# Patient Record
Sex: Female | Born: 1970 | Race: Black or African American | Hispanic: No | Marital: Single | Smoking: Former smoker
Health system: Southern US, Community
[De-identification: ages and names within clinical notes are randomized; demographics above are authoritative.]

## PROBLEM LIST (undated history)

## (undated) DIAGNOSIS — J45909 Unspecified asthma, uncomplicated: Secondary | ICD-10-CM

## (undated) DIAGNOSIS — C50919 Malignant neoplasm of unspecified site of unspecified female breast: Secondary | ICD-10-CM

## (undated) DIAGNOSIS — K219 Gastro-esophageal reflux disease without esophagitis: Secondary | ICD-10-CM

## (undated) HISTORY — PX: BREAST BIOPSY: SHX20

## (undated) HISTORY — DX: Malignant neoplasm of unspecified site of unspecified female breast: C50.919

## (undated) HISTORY — PX: TUBAL LIGATION: SHX77

---

## 2004-06-23 ENCOUNTER — Emergency Department: Payer: Self-pay | Admitting: Emergency Medicine

## 2009-05-14 ENCOUNTER — Emergency Department: Payer: Self-pay | Admitting: Emergency Medicine

## 2009-12-01 ENCOUNTER — Emergency Department: Payer: Self-pay | Admitting: Emergency Medicine

## 2010-04-27 ENCOUNTER — Emergency Department: Payer: Self-pay | Admitting: Emergency Medicine

## 2010-11-26 ENCOUNTER — Emergency Department: Payer: Self-pay | Admitting: Emergency Medicine

## 2011-02-09 ENCOUNTER — Emergency Department: Payer: Self-pay | Admitting: Emergency Medicine

## 2011-03-09 ENCOUNTER — Emergency Department: Payer: Self-pay | Admitting: Emergency Medicine

## 2011-12-07 ENCOUNTER — Emergency Department: Payer: Self-pay | Admitting: Emergency Medicine

## 2012-04-26 ENCOUNTER — Emergency Department: Payer: Self-pay | Admitting: Emergency Medicine

## 2012-09-06 ENCOUNTER — Encounter (HOSPITAL_COMMUNITY): Payer: Self-pay | Admitting: *Deleted

## 2012-09-06 ENCOUNTER — Emergency Department (HOSPITAL_COMMUNITY): Payer: BC Managed Care – PPO

## 2012-09-06 ENCOUNTER — Emergency Department (HOSPITAL_COMMUNITY)
Admission: EM | Admit: 2012-09-06 | Discharge: 2012-09-06 | Disposition: A | Discharge status: Home / Self Care | Payer: BC Managed Care – PPO | Attending: Emergency Medicine | Admitting: Emergency Medicine

## 2012-09-06 DIAGNOSIS — Y9289 Other specified places as the place of occurrence of the external cause: Secondary | ICD-10-CM | POA: Insufficient documentation

## 2012-09-06 DIAGNOSIS — Y9389 Activity, other specified: Secondary | ICD-10-CM | POA: Insufficient documentation

## 2012-09-06 DIAGNOSIS — F17200 Nicotine dependence, unspecified, uncomplicated: Secondary | ICD-10-CM | POA: Insufficient documentation

## 2012-09-06 DIAGNOSIS — S46909A Unspecified injury of unspecified muscle, fascia and tendon at shoulder and upper arm level, unspecified arm, initial encounter: Secondary | ICD-10-CM | POA: Insufficient documentation

## 2012-09-06 DIAGNOSIS — X503XXA Overexertion from repetitive movements, initial encounter: Secondary | ICD-10-CM | POA: Insufficient documentation

## 2012-09-06 DIAGNOSIS — T148XXA Other injury of unspecified body region, initial encounter: Secondary | ICD-10-CM

## 2012-09-06 DIAGNOSIS — IMO0002 Reserved for concepts with insufficient information to code with codable children: Secondary | ICD-10-CM | POA: Insufficient documentation

## 2012-09-06 DIAGNOSIS — X500XXA Overexertion from strenuous movement or load, initial encounter: Secondary | ICD-10-CM | POA: Insufficient documentation

## 2012-09-06 DIAGNOSIS — Z791 Long term (current) use of non-steroidal anti-inflammatories (NSAID): Secondary | ICD-10-CM | POA: Insufficient documentation

## 2012-09-06 DIAGNOSIS — S4980XA Other specified injuries of shoulder and upper arm, unspecified arm, initial encounter: Secondary | ICD-10-CM | POA: Insufficient documentation

## 2012-09-06 DIAGNOSIS — M25512 Pain in left shoulder: Secondary | ICD-10-CM

## 2012-09-06 MED ORDER — HYDROCODONE-ACETAMINOPHEN 5-325 MG PO TABS
1.0000 | ORAL_TABLET | Freq: Once | ORAL | Status: AC
  Administered 2012-09-06: 1 via ORAL
  Filled 2012-09-06: qty 1

## 2012-09-06 MED ORDER — DIAZEPAM 5 MG/ML IJ SOLN
10.0000 mg | Freq: Once | INTRAMUSCULAR | Status: AC
  Administered 2012-09-06: 10 mg via INTRAMUSCULAR
  Filled 2012-09-06: qty 2

## 2012-09-06 MED ORDER — NAPROXEN 500 MG PO TABS: 500.0000 mg | ORAL_TABLET | Freq: Two times a day (BID) | ORAL | Status: DC

## 2012-09-06 MED ORDER — CYCLOBENZAPRINE HCL 10 MG PO TABS: 10.0000 mg | ORAL_TABLET | Freq: Two times a day (BID) | ORAL | Status: DC | PRN

## 2012-09-06 NOTE — ED Notes (Signed)
Pt reports she was moving and lifting things at work last night, afterwards pt started having left shoulder/arm pain. 10/10. This morning the pain has increased. Pain increases with movement. Denies SOB or chest pain.

## 2012-09-06 NOTE — ED Provider Notes (Signed)
CSN: [id number]     Arrival date & time 09/06/12  1104 History     First MD Initiated Contact with Patient 09/06/12 1134     Chief Complaint  Patient presents with  . left shoulder/arm pain    (Consider location/radiation/quality/duration/timing/severity/associated sxs/prior Treatment) HPI Jill Walsh is a 42 y/o F presenting to the ED with left shoulder pain that started last night. 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 Patient reported that last night while at work her left shoulder became sore, she took a Tylenol and continued to work. Patient stated that when she got home she noticed that her pain in her left shoulder had increased, stated that at 2:00Am this morning she woke up in pain. Pain described as a constant sharp, shooting pain that radiates down the left arm - worse with motion, better at rest. Stated that she took a Ibuprofen today. Denied prior injury, weakness, loss of sensation, tingling, numbness, fall, fever, chills, neck pain, difficulty swallowing, voice changes.  PCP none  History reviewed. No pertinent past medical history. History reviewed. No pertinent past surgical history. History reviewed. No pertinent family history. History  Substance Use Topics  . Smoking status: Current Every Day Smoker -- 0.25 packs/day for 20 years    Types: Cigarettes  . Smokeless tobacco: Not on file  . Alcohol Use: No   OB History   Grav Para Term Preterm Abortions TAB SAB Ect Mult Living                 Review of Systems  Constitutional: Negative for fever and chills.  HENT: Negative for sore throat, trouble swallowing, neck pain, neck stiffness and voice change.   Eyes: Negative for visual disturbance.  Respiratory: Negative for cough, chest tightness and shortness of breath.   Musculoskeletal: Positive for arthralgias (left shoulder). Negative for back pain and joint swelling.  Neurological:  Negative for weakness and numbness.  All other systems reviewed and are negative.    Allergies  Review of patient's allergies indicates no known allergies.  Home Medications   Current Outpatient Rx  Name  Route  Sig  Dispense  Refill  . ibuprofen (ADVIL,MOTRIN) 200 MG tablet   Oral   Take 400 mg by mouth every 6 (six) hours as needed for pain.         . cyclobenzaprine (FLEXERIL) 10 MG tablet   Oral   Take 1 tablet (10 mg total) by mouth 2 (two) times daily as needed for muscle spasms.   20 tablet   0   . naproxen (NAPROSYN) 500 MG tablet   Oral   Take 1 tablet (500 mg total) by mouth 2 (two) times daily.   30 tablet   0    BP 155/79  Pulse 72  Temp(Src) 98.3 F (36.8 C) (Oral)  Resp 20  SpO2 98%  LMP 08/22/2012 Physical Exam  Nursing note and vitals reviewed. Constitutional: She is oriented to person, place, and time. She appears well-developed and well-nourished. No distress.  HENT:  Head: Normocephalic and atraumatic.  Neck: Normal range of motion. Neck supple. No tracheal deviation present.  Negative neck stiffness Negative nuchal rigidity Negative mid-cervical spine tenderness noted  Cardiovascular: Normal rate, regular rhythm and normal heart sounds.  Exam reveals no friction rub.   No murmur heard. Pulses:      Radial pulses are 2+ on the right side, and 2+ on the left side.  Pulmonary/Chest: Effort  normal and breath sounds normal. No respiratory distress. She has no wheezes. She has no rales.  Musculoskeletal: Normal range of motion. She exhibits tenderness (muscular in nature - discomfort upon palpation to the trapezius muscle, left ).       Arms: Negative deformities, inflammation,swelling, erythema, warmth to touch, lesions, sores. Muscular tenderness noted upon palpation to the left shoulder. Negative drop arm test. Full ROM to the left shoulder noted - flexion, extension, abduction, adduction, inversion, and eversion. Negative sunken in appearance.     Lymphadenopathy:    She has no cervical adenopathy.  Neurological: She is alert and oriented to person, place, and time. No cranial nerve deficit. She exhibits normal muscle tone. Coordination normal.  Cranial nerves III-XII grossly intact Sensation intact to BUE with differentiation to sharp and dull touch Strength 5+/5+ to BUE with resistance - discomfort noted  Skin: She is not diaphoretic.    ED Course   Procedures (including critical care time)  Medications  diazepam (VALIUM) injection 10 mg (10 mg Intramuscular Given 09/06/12 1243)  HYDROcodone-acetaminophen (NORCO/VICODIN) 5-325 MG per tablet 1 tablet (1 tablet Oral Given 09/06/12 1243)    Labs Reviewed - No data to display Dg Shoulder Left  09/06/2012   *RADIOLOGY REPORT*  Clinical Data: Left shoulder pain/injury  LEFT SHOULDER - 2+ VIEW  Comparison: None.  Findings: No fracture or dislocation is seen.  Mild degenerative changes of the acromioclavicular joint.  The visualized soft tissues are unremarkable.  Visualized left lung is clear.  IMPRESSION: No fracture or dislocation is seen.   Original Report Authenticated By: Charline Bills, M.D.   1. Shoulder pain, left   2. Muscle strain     MDM  Patient presenting to the ED with left shoulder pain that has been ongoing since yesterday - described as a soreness that is constant with radiation to the left arm. Negative erythema, inflammation, deformity noted to the left shoulder. Full ROM to the left shoulder. Negative neurological deficits noted. Pulses palpable. Strength intact. Sensation intact. Muscular discomfort noted upon palpation. Suspicion to be musculoskeletal in nature - tension due to heavy lifting on a daily basis. Imaging negative for acute fractures and dislocations. Patient stable, afebrile. Discharged patient with anti-inflammatories and muscle relaxer. Referral to [facility] to be re-evaluated, referral to orthopedics if pain is to worsen. Discussed with  patient to use icy hot and massage - warm compressions. Discussed with patient to refrain from strenuous activity. Discussed with patient to continue to monitor symptoms and if symptoms are to worsen or change to report back to the ED - strict return instructions given. Work note given.  Patient agreed to plan of care, understood, all questions answered.    Raymon Mutton, PA-C 09/06/12 2134

## 2012-09-07 NOTE — ED Provider Notes (Signed)
  Medical screening examination/treatment/procedure(s) were performed by non-physician practitioner and as supervising physician I was immediately available for consultation/collaboration.   Gerhard Munch, MD 09/07/12 [phone]

## 2013-01-15 ENCOUNTER — Emergency Department: Payer: Self-pay | Admitting: Emergency Medicine

## 2014-02-08 DIAGNOSIS — C50919 Malignant neoplasm of unspecified site of unspecified female breast: Secondary | ICD-10-CM

## 2014-02-08 HISTORY — DX: Malignant neoplasm of unspecified site of unspecified female breast: C50.919

## 2014-02-08 HISTORY — PX: MASTECTOMY: SHX3

## 2014-02-28 ENCOUNTER — Ambulatory Visit: Admit: 2014-02-28 | Disposition: A | Discharge status: Home / Self Care | Payer: Self-pay | Admitting: Family Medicine

## 2014-02-28 ENCOUNTER — Ambulatory Visit: Payer: Self-pay | Admitting: Surgery

## 2014-03-21 ENCOUNTER — Ambulatory Visit: Payer: Self-pay | Admitting: Surgery

## 2014-03-28 ENCOUNTER — Ambulatory Visit: Payer: Self-pay | Admitting: Surgery

## 2014-03-28 DIAGNOSIS — C50911 Malignant neoplasm of unspecified site of right female breast: Secondary | ICD-10-CM | POA: Insufficient documentation

## 2014-04-30 ENCOUNTER — Ambulatory Visit
Admit: 2014-04-30 | Disposition: A | Discharge status: Home / Self Care | Payer: Self-pay | Attending: Hematology and Oncology | Admitting: Hematology and Oncology

## 2014-04-30 ENCOUNTER — Encounter: Payer: Self-pay | Admitting: Hematology and Oncology

## 2014-05-03 LAB — CANCER ANTIGEN 27.29: CA 27.29: 24.4 U/mL (ref 0.0–38.6)

## 2014-05-10 ENCOUNTER — Ambulatory Visit
Admit: 2014-05-10 | Disposition: A | Discharge status: Home / Self Care | Payer: Self-pay | Attending: Hematology and Oncology | Admitting: Hematology and Oncology

## 2014-05-28 ENCOUNTER — Encounter: Payer: Self-pay | Admitting: Family Medicine

## 2014-05-28 DIAGNOSIS — Z803 Family history of malignant neoplasm of breast: Secondary | ICD-10-CM | POA: Insufficient documentation

## 2014-05-28 DIAGNOSIS — N63 Unspecified lump in unspecified breast: Secondary | ICD-10-CM | POA: Insufficient documentation

## 2014-05-28 DIAGNOSIS — F17200 Nicotine dependence, unspecified, uncomplicated: Secondary | ICD-10-CM | POA: Insufficient documentation

## 2014-05-28 DIAGNOSIS — C50911 Malignant neoplasm of unspecified site of right female breast: Secondary | ICD-10-CM

## 2014-05-28 DIAGNOSIS — C50919 Malignant neoplasm of unspecified site of unspecified female breast: Secondary | ICD-10-CM | POA: Insufficient documentation

## 2014-06-03 LAB — SURGICAL PATHOLOGY

## 2014-06-09 NOTE — Op Note (Signed)
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  POSTOPERATIVE DIAGNOSIS: Right breast ductal carcinoma in situ.  OPERATION: Right mastectomy.   ANESTHESIA: General.   SURGEON: Rodena Goldmann III, MD   DESCRIPTION OF OPERATIVE PROCEDURE: With the patient in the supine position after the induction of appropriate general anesthesia, the patient's right breast was prepped with ChloraPrep and draped with sterile towels. A fishmouth  incision was measured and drawn on the right breast. The incision was carried down through the subcutaneous tissue with Bovie electrocautery. Superior flap was created down to the 2nd intercostal space, inferior flap down to the inframammary fold. The breast then swept off the chest wall medial to lateral, using Bovie electrocautery for hemostasis. Dissection was stopped just medial to the latissimus muscle. The area was then copiously irrigated. The breast was passed off the table after marking the medial aspect. On the inferior flap, 2 stab wounds were made and 10 mm flat Jackson-Pratt drains were inserted under the flap into the chest wall. The drains secured with 3-0 nylon. The skin was closed with vertical mattress sutures of 4-0 nylon. A compressive dressing was applied. The patient returned to the recovery room, having tolerated the procedure well. Sponge, instrument, needle counts were correct x 2 in the operating room.   ____________________________ Rodena Goldmann III, MD rle:mw D: 03/28/2014 13:51:07 ET T: 03/28/2014 14:41:42 ET JOB#: [id number]  cc: Rodena Goldmann III, MD, <Dictator> Rodena Goldmann MD ELECTRONICALLY SIGNED 03/29/2014 18:09

## 2014-06-09 NOTE — Discharge Summary (Signed)
PATIENT NAME:  Jill Walsh, Jill Walsh MR#:  [id number] DATE OF BIRTH:  April 01, 1970  DATE OF ADMISSION:  03/28/2014 DATE OF DISCHARGE:  03/29/2014    BRIEF HISTORY: Genesys Coggeshall is a 44 year old woman with recently diagnosed breast carcinoma in the form of ductal carcinoma in situ. She had a negative axillary lymph node biopsy. After appropriate treatment discussions were outlined to her, she elected to proceed with mastectomy. After appropriate preoperative preparation and informed consent, she was taken to surgery on the morning of 03/28/2014, where she underwent a simple mastectomy. The procedure was uncomplicated. She had no significant intraoperative problems. Today, she is up, active, tolerating a diet, with no complaints, with good pain control, minimal wound discomfort. She has not had any significant drainage. Discharged home today to be followed in the office in 3-5 days for pathology review and possible drain removal. This plan has been discussed with the patient in detail, and she is in agreement.    ____________________________ Micheline Maze, MD rle:mw D: 03/29/2014 12:28:26 ET T: 03/29/2014 13:56:25 ET JOB#: [id number]  cc: Rodena Goldmann III, MD, <Dictator> Rodena Goldmann MD ELECTRONICALLY SIGNED 03/29/2014 18:10

## 2014-08-19 ENCOUNTER — Other Ambulatory Visit: Payer: Self-pay

## 2014-08-19 DIAGNOSIS — C50911 Malignant neoplasm of unspecified site of right female breast: Secondary | ICD-10-CM

## 2014-08-23 ENCOUNTER — Inpatient Hospital Stay (HOSPITAL_BASED_OUTPATIENT_CLINIC_OR_DEPARTMENT_OTHER): Payer: 59 | Admitting: Hematology and Oncology

## 2014-08-23 ENCOUNTER — Inpatient Hospital Stay: Payer: 59 | Attending: Hematology and Oncology

## 2014-08-23 ENCOUNTER — Encounter: Payer: Self-pay | Admitting: Hematology and Oncology

## 2014-08-23 ENCOUNTER — Other Ambulatory Visit: Payer: Self-pay | Admitting: *Deleted

## 2014-08-23 VITALS — BP 132/92 | HR 106 | Temp 96.6°F | Ht 65.0 in | Wt 192.7 lb

## 2014-08-23 DIAGNOSIS — C50911 Malignant neoplasm of unspecified site of right female breast: Secondary | ICD-10-CM | POA: Diagnosis not present

## 2014-08-23 DIAGNOSIS — Z79899 Other long term (current) drug therapy: Secondary | ICD-10-CM | POA: Insufficient documentation

## 2014-08-23 DIAGNOSIS — Z171 Estrogen receptor negative status [ER-]: Secondary | ICD-10-CM | POA: Insufficient documentation

## 2014-08-23 DIAGNOSIS — F17210 Nicotine dependence, cigarettes, uncomplicated: Secondary | ICD-10-CM

## 2014-08-23 DIAGNOSIS — Z803 Family history of malignant neoplasm of breast: Secondary | ICD-10-CM

## 2014-08-23 DIAGNOSIS — Z9011 Acquired absence of right breast and nipple: Secondary | ICD-10-CM | POA: Diagnosis not present

## 2014-08-23 LAB — COMPREHENSIVE METABOLIC PANEL
ALT: 14 U/L (ref 14–54)
AST: 19 U/L (ref 15–41)
Albumin: 4.1 g/dL (ref 3.5–5.0)
Alkaline Phosphatase: 66 U/L (ref 38–126)
Anion gap: 9 (ref 5–15)
BUN: 12 mg/dL (ref 6–20)
CO2: 25 mmol/L (ref 22–32)
Calcium: 8.9 mg/dL (ref 8.9–10.3)
Chloride: 104 mmol/L (ref 101–111)
Creatinine, Ser: 0.99 mg/dL (ref 0.44–1.00)
GFR calc Af Amer: >60 mL/min (ref >60)
GFR calc non Af Amer: >60 mL/min (ref >60)
Glucose, Bld: 105 mg/dL — ABNORMAL HIGH (ref 65–99)
Potassium: 3.8 mmol/L (ref 3.5–5.1)
Sodium: 138 mmol/L (ref 135–145)
Total Bilirubin: 0.5 mg/dL (ref 0.3–1.2)
Total Protein: 7.8 g/dL (ref 6.5–8.1)

## 2014-08-23 LAB — CBC WITH DIFFERENTIAL/PLATELET
Basophils Absolute: 0.1 10*3/uL (ref 0–0.1)
Basophils Relative: 1 %
Eosinophils Absolute: 0.2 10*3/uL (ref 0–0.7)
Eosinophils Relative: 3 %
HCT: 39.7 % (ref 35.0–47.0)
Hemoglobin: 13 g/dL (ref 12.0–16.0)
Lymphocytes Relative: 25 %
Lymphs Abs: 2.3 10*3/uL (ref 1.0–3.6)
MCH: 28 pg (ref 26.0–34.0)
MCHC: 32.8 g/dL (ref 32.0–36.0)
MCV: 85.4 fL (ref 80.0–100.0)
Monocytes Absolute: 0.6 10*3/uL (ref 0.2–0.9)
Monocytes Relative: 7 %
Neutro Abs: 5.9 10*3/uL (ref 1.4–6.5)
Neutrophils Relative %: 64 %
Platelets: 315 10*3/uL (ref 150–440)
RBC: 4.65 MIL/uL (ref 3.80–5.20)
RDW: 14.3 % (ref 11.5–14.5)
WBC: 9 10*3/uL (ref 3.6–11.0)

## 2014-08-23 NOTE — Progress Notes (Signed)
Pt here for follow up regarding breast cancer; offer no complaints today

## 2014-08-24 LAB — CANCER ANTIGEN 27.29: CA 27.29: 17.9 U/mL (ref 0.0–38.6)

## 2014-11-24 ENCOUNTER — Encounter: Payer: Self-pay | Admitting: Hematology and Oncology

## 2014-11-24 NOTE — Progress Notes (Signed)
[facility] day:  08/23/2014  Chief Complaint: Jill Walsh is a 44 y.o. 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  HPI: The patient was last seen in the medical oncology clinic on 05/21/2014.  At that time, she was recovering well from her surgery.  Exam revealed an unremarkable well healed right mastectomy incision.  We discussed tumor board recommendations for surveillance.  Labs from 04/30/2014 had revealed a normal CBC, CMP, and CA27.29 (24.4).  MyRisk genetic testing revealed no clinically significant mutation.  During the interim, she states that she feels good.  She is working.  She denies any concerns.  She notes that they did the molding for breast prosthesis.  Past Medical History  Diagnosis Date  . Breast cancer     Past Surgical History  Procedure Laterality Date  . Tubal ligation      Family History  Problem Relation Age of Onset  . Cancer Mother   . Stroke Brother   . Cancer Maternal Aunt     Social History:  reports that she has been smoking E-cigarettes.  She has a 5 pack-year smoking history. She does not have any smokeless tobacco history on file. She reports that she does not drink alcohol. Her drug history is not on file.  She is a Freight forwarder at [employer].  The patient is accompanied by her brother, Simona Huh, today.  Allergies: No Known Allergies  Current Medications: Current Outpatient Prescriptions  Medication Sig Dispense Refill  . ibuprofen (ADVIL,MOTRIN) 200 MG tablet Take 400 mg by mouth every 6 (six) hours as needed for pain.    . naproxen (NAPROSYN) 500 MG tablet Take 1 tablet (500 mg total) by mouth 2 (two) times daily. 30 tablet 0  . cyclobenzaprine (FLEXERIL) 10 MG tablet Take 1 tablet (10 mg total) by mouth 2 (two) times daily as needed for muscle spasms. (Patient not taking: Reported on 08/23/2014) 20 tablet 0   No  current facility-administered medications for this visit.    Review of Systems:  GENERAL:  Feels good.  Active.  No fevers, sweats or weight loss. PERFORMANCE STATUS (ECOG):  0 HEENT:  No visual changes, runny nose, sore throat, mouth sores or tenderness. Lungs: No shortness of breath or cough.  No hemoptysis. Cardiac:  No chest pain, palpitations, orthopnea, or PND. GI:  No nausea, vomiting, diarrhea, constipation, melena or hematochezia. GU:  No urgency, frequency, dysuria, or hematuria. Musculoskeletal:  No back pain.  No joint pain.  No muscle tenderness. Extremities:  No pain or swelling. Skin:  No rashes or skin changes. Neuro:  No headache, numbness or weakness, balance or coordination issues. Endocrine:  No diabetes, thyroid issues, hot flashes or night sweats. Psych:  No mood changes, depression or anxiety. Pain:  No focal pain. Review of systems:  All other systems reviewed and found to be negative.  Physical Exam: Blood pressure 132/92, pulse 106, temperature 96.6 F (35.9 C), temperature source Tympanic, height 5' 5" (1.651 m), weight 192 lb 10.9 oz (87.4 kg). GENERAL:  Well developed, well nourished, sitting comfortably in the exam room in no acute distress. MENTAL STATUS:  Alert and oriented to person, place and time. HEAD:  Curly long blonde hair.  Normocephalic, atraumatic, face symmetric, no Cushingoid features. EYES:  Glasses.  Brown eyes.  Pupils equal round and reactive to light and accomodation.  No conjunctivitis or scleral icterus. ENT:  Oropharynx  clear without lesion.  Tongue normal. Mucous membranes moist.  RESPIRATORY:  Clear to auscultation without rales, wheezes or rhonchi. CARDIOVASCULAR:  Regular rate and rhythm without murmur, rub or gallop. BREAST:  Right mastectomy.  Left breast with fibrocystic changes superiorly.  No discrete masses, skin changes or nipple discharge.  ABDOMEN:  Soft, non-tender, with active bowel sounds, and no hepatosplenomegaly.   No masses. SKIN:  No rashes, ulcers or lesions. EXTREMITIES: No edema, no skin discoloration or tenderness.  No palpable cords. LYMPH NODES: No palpable cervical, supraclavicular, axillary or inguinal adenopathy  NEUROLOGICAL: Unremarkable. PSYCH:  Appropriate.  Orders Only on 08/23/2014  Component Date Value Ref Range Status  . WBC 08/23/2014 9.0  3.6 - 11.0 K/uL Final  . RBC 08/23/2014 4.65  3.80 - 5.20 MIL/uL Final  . Hemoglobin 08/23/2014 13.0  12.0 - 16.0 g/dL Final  . HCT 08/23/2014 39.7  35.0 - 47.0 % Final  . MCV 08/23/2014 85.4  80.0 - 100.0 fL Final  . MCH 08/23/2014 28.0  26.0 - 34.0 pg Final  . MCHC 08/23/2014 32.8  32.0 - 36.0 g/dL Final  . RDW 08/23/2014 14.3  11.5 - 14.5 % Final  . Platelets 08/23/2014 315  150 - 440 K/uL Final  . Neutrophils Relative % 08/23/2014 64   Final  . Neutro Abs 08/23/2014 5.9  1.4 - 6.5 K/uL Final  . Lymphocytes Relative 08/23/2014 25   Final  . Lymphs Abs 08/23/2014 2.3  1.0 - 3.6 K/uL Final  . Monocytes Relative 08/23/2014 7   Final  . Monocytes Absolute 08/23/2014 0.6  0.2 - 0.9 K/uL Final  . Eosinophils Relative 08/23/2014 3   Final  . Eosinophils Absolute 08/23/2014 0.2  0 - 0.7 K/uL Final  . Basophils Relative 08/23/2014 1   Final  . Basophils Absolute 08/23/2014 0.1  0 - 0.1 K/uL Final  . Sodium 08/23/2014 138  135 - 145 mmol/L Final  . Potassium 08/23/2014 3.8  3.5 - 5.1 mmol/L Final  . Chloride 08/23/2014 104  101 - 111 mmol/L Final  . CO2 08/23/2014 25  22 - 32 mmol/L Final  . Glucose, Bld 08/23/2014 105* 65 - 99 mg/dL Final  . BUN 08/23/2014 12  6 - 20 mg/dL Final  . Creatinine, Ser 08/23/2014 0.99  0.44 - 1.00 mg/dL Final  . Calcium 08/23/2014 8.9  8.9 - 10.3 mg/dL Final  . Total Protein 08/23/2014 7.8  6.5 - 8.1 g/dL Final  . Albumin 08/23/2014 4.1  3.5 - 5.0 g/dL Final  . AST 08/23/2014 19  15 - 41 U/L Final  . ALT 08/23/2014 14  14 - 54 U/L Final  . Alkaline Phosphatase 08/23/2014 66  38 - 126 U/L Final  . Total  Bilirubin 08/23/2014 0.5  0.3 - 1.2 mg/dL Final  . GFR calc non Af Amer 08/23/2014 >60  >60 mL/min Final  . GFR calc Af Amer 08/23/2014 >60  >60 mL/min Final   Comment: (NOTE) The eGFR has been calculated using the CKD EPI equation. This calculation has not been validated in all clinical situations. eGFR's persistently <60 mL/min signify possible Chronic Kidney Disease.   . Anion gap 08/23/2014 9  5 - 15 Final  . CA 27.29 08/23/2014 17.9  0.0 - 38.6 U/mL Final   Comment: (NOTE) [employer]/ACS methodology Performed At: [facility] [address] [place], [place] [id number] Lindon Romp MD NL:[id number]     Assessment:  Raigen Jagielski is a 44 y.o. female with Her2neu+ multi-focal  microinvasive right breast cancer status post mastectomy on 03/28/2014.  No lymph nodes were removed.  Tumor was ER/PR negative and Her2/neu 3+.  Pathologic stage was T49mNx.  CA27.20 was 24.4 (normal) on 04/30/2014.  She has a mother who developed breast cancer at age 45  A maternal aunt has colon cancer.  MyRisk genetic testing revealed no clinically significant mutation.  Symptomatically, she is doing well.  Exam is unremarkable.  Plan: 1. Labs today:  CBC with diff, CMP, CA27.29. 2. Complete paperwork for breast prosthesis [phone] 3. Anticipate left mammogram on 02/28/2015. 4. RTC in 3 months for MD assessment and labs (CBC with diff, CMP, CA27.29)   MLequita Asal MD  08/23/2014

## 2014-11-25 ENCOUNTER — Inpatient Hospital Stay: Payer: 59 | Attending: Hematology and Oncology

## 2014-11-25 ENCOUNTER — Inpatient Hospital Stay (HOSPITAL_BASED_OUTPATIENT_CLINIC_OR_DEPARTMENT_OTHER): Payer: 59 | Admitting: Hematology and Oncology

## 2014-11-25 VITALS — BP 149/86 | HR 112 | Temp 96.7°F | Resp 18 | Ht 65.0 in | Wt 196.9 lb

## 2014-11-25 DIAGNOSIS — C50911 Malignant neoplasm of unspecified site of right female breast: Secondary | ICD-10-CM | POA: Diagnosis not present

## 2014-11-25 DIAGNOSIS — F17210 Nicotine dependence, cigarettes, uncomplicated: Secondary | ICD-10-CM | POA: Diagnosis not present

## 2014-11-25 DIAGNOSIS — Z171 Estrogen receptor negative status [ER-]: Secondary | ICD-10-CM

## 2014-11-25 DIAGNOSIS — Z9011 Acquired absence of right breast and nipple: Secondary | ICD-10-CM | POA: Insufficient documentation

## 2014-11-25 DIAGNOSIS — R0602 Shortness of breath: Secondary | ICD-10-CM | POA: Insufficient documentation

## 2014-11-25 DIAGNOSIS — Z803 Family history of malignant neoplasm of breast: Secondary | ICD-10-CM | POA: Diagnosis not present

## 2014-11-25 DIAGNOSIS — Z79899 Other long term (current) drug therapy: Secondary | ICD-10-CM

## 2014-11-25 LAB — CBC WITH DIFFERENTIAL/PLATELET
Basophils Absolute: 0.1 10*3/uL (ref 0–0.1)
Basophils Relative: 1 %
Eosinophils Absolute: 0.1 10*3/uL (ref 0–0.7)
Eosinophils Relative: 1 %
HCT: 37.9 % (ref 35.0–47.0)
Hemoglobin: 12.5 g/dL (ref 12.0–16.0)
Lymphocytes Relative: 26 %
Lymphs Abs: 3.1 10*3/uL (ref 1.0–3.6)
MCH: 27.7 pg (ref 26.0–34.0)
MCHC: 33.1 g/dL (ref 32.0–36.0)
MCV: 83.8 fL (ref 80.0–100.0)
Monocytes Absolute: 0.6 10*3/uL (ref 0.2–0.9)
Monocytes Relative: 5 %
Neutro Abs: 8.4 10*3/uL — ABNORMAL HIGH (ref 1.4–6.5)
Neutrophils Relative %: 67 %
Platelets: 277 10*3/uL (ref 150–440)
RBC: 4.52 MIL/uL (ref 3.80–5.20)
RDW: 13.7 % (ref 11.5–14.5)
WBC: 12.3 10*3/uL — ABNORMAL HIGH (ref 3.6–11.0)

## 2014-11-25 LAB — COMPREHENSIVE METABOLIC PANEL
ALT: 19 U/L (ref 14–54)
AST: 19 U/L (ref 15–41)
Albumin: 4 g/dL (ref 3.5–5.0)
Alkaline Phosphatase: 68 U/L (ref 38–126)
Anion gap: 6 (ref 5–15)
BUN: 12 mg/dL (ref 6–20)
CO2: 24 mmol/L (ref 22–32)
Calcium: 8.4 mg/dL — ABNORMAL LOW (ref 8.9–10.3)
Chloride: 104 mmol/L (ref 101–111)
Creatinine, Ser: 0.97 mg/dL (ref 0.44–1.00)
GFR calc Af Amer: >60 mL/min (ref >60)
GFR calc non Af Amer: >60 mL/min (ref >60)
Glucose, Bld: 114 mg/dL — ABNORMAL HIGH (ref 65–99)
Potassium: 3.7 mmol/L (ref 3.5–5.1)
Sodium: 134 mmol/L — ABNORMAL LOW (ref 135–145)
Total Bilirubin: 0.3 mg/dL (ref 0.3–1.2)
Total Protein: 7.5 g/dL (ref 6.5–8.1)

## 2014-11-25 NOTE — Progress Notes (Signed)
Patient is here for follow-up of breast cancer. Patient states that she has been having some off and on burning sensation near and around her left breast.

## 2014-11-25 NOTE — Progress Notes (Signed)
[facility] day:  11/25/2014   Chief Complaint: Jill Walsh is a 44 y.o. 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  HPI: The patient was last seen in the medical oncology clinic on 08/23/2014.  At that time, she was seen for 3 month assessment.  She felt good.  She was working.  She denied any concerns.    During the interim, she has felt fine.  She notes some soreness under her right arm like she lifted something.  She also notes a stinging/burning sensation on the left side.  She notes some shortness of breath.  She is smoking 2 cigarettes a day.  Past Medical History  Diagnosis Date  . Breast cancer [facility])     Past Surgical History  Procedure Laterality Date  . Tubal ligation      Family History  Problem Relation Age of Onset  . Cancer Mother   . Stroke Brother   . Cancer Maternal Aunt     Social History:  reports that she has been smoking E-cigarettes.  She has a 5 pack-year smoking history. She does not have any smokeless tobacco history on file. She reports that she does not drink alcohol. Her drug history is not on file.  She is smoking 2 cigarettes a day.  She is a Freight forwarder at [employer].  The patient is accompanied by a gentleman today.  Allergies: No Known Allergies  Current Medications: Current Outpatient Prescriptions  Medication Sig Dispense Refill  . ibuprofen (ADVIL,MOTRIN) 200 MG tablet Take 400 mg by mouth every 6 (six) hours as needed for pain.    . cyclobenzaprine (FLEXERIL) 10 MG tablet Take 1 tablet (10 mg total) by mouth 2 (two) times daily as needed for muscle spasms. (Patient not taking: Reported on 08/23/2014) 20 tablet 0  . naproxen (NAPROSYN) 500 MG tablet Take 1 tablet (500 mg total) by mouth 2 (two) times daily. (Patient not taking: Reported on 11/25/2014) 30 tablet 0   No current facility-administered medications for  this visit.    Review of Systems:  GENERAL:  Feels "ok".  No fevers, sweats or weight loss. PERFORMANCE STATUS (ECOG):  0 HEENT:  No visual changes, runny nose, sore throat, mouth sores or tenderness. Lungs: Mild shortness of breath.  No cough.  No hemoptysis. Cardiac:  No chest pain, palpitations, orthopnea, or PND. Breasts:  Stinging/burning on left side.  No skin changes, masses, or nipple discharge. GI:  No nausea, vomiting, diarrhea, constipation, melena or hematochezia. GU:  No urgency, frequency, dysuria, or hematuria. Musculoskeletal:  Sore under right arm.  No back pain.  No joint pain.  No muscle tenderness. Extremities:  No pain or swelling. Skin:  No rashes or skin changes. Neuro:  No headache, numbness or weakness, balance or coordination issues. Endocrine:  No diabetes, thyroid issues, hot flashes or night sweats. Psych:  No mood changes, depression or anxiety. Pain:  No focal pain. Review of systems:  All other systems reviewed and found to be negative.  Physical Exam: Blood pressure 149/86, pulse 112, temperature 96.7 F (35.9 C), temperature source Tympanic, resp. rate 18, height 5' 5"  (1.651 m), weight 196 lb 13.9 oz (89.3 kg). GENERAL:  Well developed, well nourished, sitting comfortably in the exam room in no acute distress. MENTAL STATUS:  Alert and oriented to person, place and time. HEAD:  Curly long blonde hair.  Normocephalic, atraumatic, face symmetric,  no Cushingoid features. EYES:  Glasses.  Brown eyes.  Pupils equal round and reactive to light and accomodation.  No conjunctivitis or scleral icterus. ENT:  Oropharynx clear without lesion.  Tongue normal. Mucous membranes moist.  RESPIRATORY:  Clear to auscultation without rales, wheezes or rhonchi. CARDIOVASCULAR:  Regular rate and rhythm without murmur, rub or gallop. BREAST:  Right mastectomy.  Left breast with fibrocystic changes superiorly.  No discrete masses, skin changes or nipple discharge.   ABDOMEN:  Soft, non-tender, with active bowel sounds, and no hepatosplenomegaly.  No masses. SKIN:  No rashes, ulcers or lesions. EXTREMITIES: No edema, no skin discoloration or tenderness.  No palpable cords. LYMPH NODES: No palpable cervical, supraclavicular, axillary or inguinal adenopathy  NEUROLOGICAL: Unremarkable. PSYCH:  Appropriate.  Appointment on 11/25/2014  Component Date Value Ref Range Status  . WBC 11/25/2014 12.3* 3.6 - 11.0 K/uL Final  . RBC 11/25/2014 4.52  3.80 - 5.20 MIL/uL Final  . Hemoglobin 11/25/2014 12.5  12.0 - 16.0 g/dL Final  . HCT 11/25/2014 37.9  35.0 - 47.0 % Final  . MCV 11/25/2014 83.8  80.0 - 100.0 fL Final  . MCH 11/25/2014 27.7  26.0 - 34.0 pg Final  . MCHC 11/25/2014 33.1  32.0 - 36.0 g/dL Final  . RDW 11/25/2014 13.7  11.5 - 14.5 % Final  . Platelets 11/25/2014 277  150 - 440 K/uL Final  . Neutrophils Relative % 11/25/2014 67   Final  . Neutro Abs 11/25/2014 8.4* 1.4 - 6.5 K/uL Final  . Lymphocytes Relative 11/25/2014 26   Final  . Lymphs Abs 11/25/2014 3.1  1.0 - 3.6 K/uL Final  . Monocytes Relative 11/25/2014 5   Final  . Monocytes Absolute 11/25/2014 0.6  0.2 - 0.9 K/uL Final  . Eosinophils Relative 11/25/2014 1   Final  . Eosinophils Absolute 11/25/2014 0.1  0 - 0.7 K/uL Final  . Basophils Relative 11/25/2014 1   Final  . Basophils Absolute 11/25/2014 0.1  0 - 0.1 K/uL Final  . Sodium 11/25/2014 134* 135 - 145 mmol/L Final  . Potassium 11/25/2014 3.7  3.5 - 5.1 mmol/L Final  . Chloride 11/25/2014 104  101 - 111 mmol/L Final  . CO2 11/25/2014 24  22 - 32 mmol/L Final  . Glucose, Bld 11/25/2014 114* 65 - 99 mg/dL Final  . BUN 11/25/2014 12  6 - 20 mg/dL Final  . Creatinine, Ser 11/25/2014 0.97  0.44 - 1.00 mg/dL Final  . Calcium 11/25/2014 8.4* 8.9 - 10.3 mg/dL Final  . Total Protein 11/25/2014 7.5  6.5 - 8.1 g/dL Final  . Albumin 11/25/2014 4.0  3.5 - 5.0 g/dL Final  . AST 11/25/2014 19  15 - 41 U/L Final  . ALT 11/25/2014 19  14 - 54  U/L Final  . Alkaline Phosphatase 11/25/2014 68  38 - 126 U/L Final  . Total Bilirubin 11/25/2014 0.3  0.3 - 1.2 mg/dL Final  . GFR calc non Af Amer 11/25/2014 >60  >60 mL/min Final  . GFR calc Af Amer 11/25/2014 >60  >60 mL/min Final   Comment: (NOTE) The eGFR has been calculated using the CKD EPI equation. This calculation has not been validated in all clinical situations. eGFR's persistently <60 mL/min signify possible Chronic Kidney Disease.   . Anion gap 11/25/2014 6  5 - 15 Final    Assessment:  Jill Walsh is a 44 y.o. 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  No lymph nodes  were removed.  Tumor was ER/PR negative and Her2/neu 3+.  Pathologic stage was T75mNx.  CA27.29 was 24.4 (0-38.6) on 04/30/2014 and 18 on 11/25/2014.  She has a mother who developed breast cancer at age 45  A maternal aunt has colon cancer.  MyRisk genetic testing revealed no clinically significant mutation.  Symptomatically, she notes some left breast discomfort/stinging.  Exam is unremarkable.  Plan: 1. Labs today:  CBC with diff, CMP, CA27.29. 2. Left mammogram and ultrasound on 02/28/2015. 3. Patient to notify clinic if any skin changes occur (? shingles). 4. RTC after mammogram for MD assessment and labs (CBC with diff, CMP, CA27.29)   MLequita Asal MD  11/25/2014, 3:30 PM

## 2014-11-25 NOTE — Progress Notes (Signed)
Card sent for follow up.

## 2014-11-26 LAB — CANCER ANTIGEN 27.29: CA 27.29: 18 U/mL (ref 0.0–38.6)

## 2014-12-06 ENCOUNTER — Other Ambulatory Visit: Payer: Self-pay | Admitting: Hematology and Oncology

## 2014-12-06 ENCOUNTER — Ambulatory Visit
Admission: RE | Admit: 2014-12-06 | Discharge: 2014-12-06 | Disposition: A | Discharge status: Home / Self Care | Payer: 59 | Source: Ambulatory Visit | Attending: Hematology and Oncology | Admitting: Hematology and Oncology

## 2014-12-06 DIAGNOSIS — N6002 Solitary cyst of left breast: Secondary | ICD-10-CM | POA: Insufficient documentation

## 2014-12-06 DIAGNOSIS — C50911 Malignant neoplasm of unspecified site of right female breast: Secondary | ICD-10-CM

## 2014-12-06 DIAGNOSIS — Z853 Personal history of malignant neoplasm of breast: Secondary | ICD-10-CM | POA: Diagnosis not present

## 2014-12-06 DIAGNOSIS — Z9011 Acquired absence of right breast and nipple: Secondary | ICD-10-CM | POA: Insufficient documentation

## 2014-12-13 ENCOUNTER — Ambulatory Visit: Payer: 59 | Admitting: Hematology and Oncology

## 2014-12-20 ENCOUNTER — Inpatient Hospital Stay: Payer: 59 | Attending: Hematology and Oncology | Admitting: Hematology and Oncology

## 2014-12-20 VITALS — BP 138/89 | HR 102 | Temp 97.9°F | Wt 197.5 lb

## 2014-12-20 DIAGNOSIS — Z79899 Other long term (current) drug therapy: Secondary | ICD-10-CM | POA: Insufficient documentation

## 2014-12-20 DIAGNOSIS — Z171 Estrogen receptor negative status [ER-]: Secondary | ICD-10-CM | POA: Insufficient documentation

## 2014-12-20 DIAGNOSIS — F17210 Nicotine dependence, cigarettes, uncomplicated: Secondary | ICD-10-CM

## 2014-12-20 DIAGNOSIS — N644 Mastodynia: Secondary | ICD-10-CM | POA: Insufficient documentation

## 2014-12-20 DIAGNOSIS — C50911 Malignant neoplasm of unspecified site of right female breast: Secondary | ICD-10-CM | POA: Diagnosis not present

## 2014-12-20 DIAGNOSIS — C50012 Malignant neoplasm of nipple and areola, left female breast: Secondary | ICD-10-CM

## 2014-12-20 DIAGNOSIS — Z803 Family history of malignant neoplasm of breast: Secondary | ICD-10-CM | POA: Diagnosis not present

## 2014-12-20 DIAGNOSIS — Z9011 Acquired absence of right breast and nipple: Secondary | ICD-10-CM | POA: Insufficient documentation

## 2014-12-20 MED ORDER — TRAMADOL HCL 50 MG PO TABS: 50.0000 mg | ORAL_TABLET | Freq: Two times a day (BID) | ORAL | Status: DC | PRN

## 2014-12-20 NOTE — Progress Notes (Signed)
[facility] day:  12/20/2014   Chief Complaint: Jill Walsh is a 44 y.o. 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  HPI: The patient was last seen in the medical oncology clinic on 11/25/2014.  At that time, she was seen for 3 month assessment.  She noted left breast stinging.  Exam was unremarkable.  Labs were unremarkable except for a calcium of 8.4.  She was scheduled for follow-up mammogram and ultrasound.  Left mammogram on 12/06/2014 revealed no mammographic evidence of malignancy in the left breast.  Targeted ultrasound revealed no suspicious masses or shadowing lesions.  There was an incidental note of 2 less than 4 mm benign appearing cysts at the 9:30 o'clock 9 cm and 10 cm from the nipple.  During the interim, she notes left breast pain.  Motrin and Tylenol do not help.  She notes that it hurts to raise her arm.  Past Medical History  Diagnosis Date  . Breast cancer [facility])     right breast 2016    Past Surgical History  Procedure Laterality Date  . Tubal ligation    . Mastectomy Right     03/2014  . Breast biopsy Left     02/28/14 negative  . Breast biopsy Right      02/2014 malignant    Family History  Problem Relation Age of Onset  . Cancer Mother   . Breast cancer Mother 34  . Stroke Brother   . Cancer Maternal Aunt     Social History:  reports that she has been smoking E-cigarettes.  She has a 5 pack-year smoking history. She does not have any smokeless tobacco history on file. She reports that she does not drink alcohol. Her drug history is not on file.  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  Allergies: No Known Allergies  Current Medications: Current Outpatient Prescriptions  Medication Sig Dispense Refill  .  diphenhydramine-acetaminophen (TYLENOL PM) 25-500 MG TABS tablet Take 1 tablet by mouth at bedtime as needed.    . cyclobenzaprine (FLEXERIL) 10 MG tablet Take 1 tablet (10 mg total) by mouth 2 (two) times daily as needed for muscle spasms. (Patient not taking: Reported on 08/23/2014) 20 tablet 0  . ibuprofen (ADVIL,MOTRIN) 200 MG tablet Take 400 mg by mouth every 6 (six) hours as needed for pain.    . naproxen (NAPROSYN) 500 MG tablet Take 1 tablet (500 mg total) by mouth 2 (two) times daily. (Patient not taking: Reported on 11/25/2014) 30 tablet 0   No current facility-administered medications for this visit.    Review of Systems:  GENERAL:  Feels "ok".  No fevers, sweats or weight loss. PERFORMANCE STATUS (ECOG):  0 HEENT:  No visual changes, runny nose, sore throat, mouth sores or tenderness. Lungs: No shortness of breath or cough.  No hemoptysis. Cardiac:  No chest pain, palpitations, orthopnea, or PND. Breasts:  Persistent left breast pain.  No skin changes or nipple discharge. GI:  No nausea, vomiting, diarrhea, constipation, melena or hematochezia. GU:  No urgency, frequency, dysuria, or hematuria. Musculoskeletal:  No back pain.  No joint pain.  No muscle tenderness. Extremities:  No pain or swelling. Skin:  No rashes or skin changes. Neuro:  No headache, numbness or weakness, balance or coordination issues. Endocrine:  No diabetes, thyroid  issues, hot flashes or night sweats. Psych:  No mood changes, depression or anxiety. Pain:  No focal pain. Review of systems:  All other systems reviewed and found to be negative.  Physical Exam: Blood pressure 138/89, pulse 102, temperature 97.9 F (36.6 C), temperature source Tympanic, weight 197 lb 8.5 oz (89.6 kg), last menstrual period 11/29/2014. GENERAL:  Well developed, well nourished, sitting comfortably in the exam room in no acute distress. MENTAL STATUS:  Alert and oriented to person, place and time. HEAD:  Curly long blonde  hair.  Normocephalic, atraumatic, face symmetric, no Cushingoid features. EYES:  Glasses.  Brown eyes.  No conjunctivitis or scleral icterus. NEUROLOGICAL: Unremarkable. PSYCH:  Appropriate.  No visits with results within 3 Day(s) from this visit. Latest known visit with results is:  Appointment on 11/25/2014  Component Date Value Ref Range Status  . WBC 11/25/2014 12.3* 3.6 - 11.0 K/uL Final  . RBC 11/25/2014 4.52  3.80 - 5.20 MIL/uL Final  . Hemoglobin 11/25/2014 12.5  12.0 - 16.0 g/dL Final  . HCT 11/25/2014 37.9  35.0 - 47.0 % Final  . MCV 11/25/2014 83.8  80.0 - 100.0 fL Final  . MCH 11/25/2014 27.7  26.0 - 34.0 pg Final  . MCHC 11/25/2014 33.1  32.0 - 36.0 g/dL Final  . RDW 11/25/2014 13.7  11.5 - 14.5 % Final  . Platelets 11/25/2014 277  150 - 440 K/uL Final  . Neutrophils Relative % 11/25/2014 67   Final  . Neutro Abs 11/25/2014 8.4* 1.4 - 6.5 K/uL Final  . Lymphocytes Relative 11/25/2014 26   Final  . Lymphs Abs 11/25/2014 3.1  1.0 - 3.6 K/uL Final  . Monocytes Relative 11/25/2014 5   Final  . Monocytes Absolute 11/25/2014 0.6  0.2 - 0.9 K/uL Final  . Eosinophils Relative 11/25/2014 1   Final  . Eosinophils Absolute 11/25/2014 0.1  0 - 0.7 K/uL Final  . Basophils Relative 11/25/2014 1   Final  . Basophils Absolute 11/25/2014 0.1  0 - 0.1 K/uL Final  . Sodium 11/25/2014 134* 135 - 145 mmol/L Final  . Potassium 11/25/2014 3.7  3.5 - 5.1 mmol/L Final  . Chloride 11/25/2014 104  101 - 111 mmol/L Final  . CO2 11/25/2014 24  22 - 32 mmol/L Final  . Glucose, Bld 11/25/2014 114* 65 - 99 mg/dL Final  . BUN 11/25/2014 12  6 - 20 mg/dL Final  . Creatinine, Ser 11/25/2014 0.97  0.44 - 1.00 mg/dL Final  . Calcium 11/25/2014 8.4* 8.9 - 10.3 mg/dL Final  . Total Protein 11/25/2014 7.5  6.5 - 8.1 g/dL Final  . Albumin 11/25/2014 4.0  3.5 - 5.0 g/dL Final  . AST 11/25/2014 19  15 - 41 U/L Final  . ALT 11/25/2014 19  14 - 54 U/L Final  . Alkaline Phosphatase 11/25/2014 68  38 - 126  U/L Final  . Total Bilirubin 11/25/2014 0.3  0.3 - 1.2 mg/dL Final  . GFR calc non Af Amer 11/25/2014 >60  >60 mL/min Final  . GFR calc Af Amer 11/25/2014 >60  >60 mL/min Final   Comment: (NOTE) The eGFR has been calculated using the CKD EPI equation. This calculation has not been validated in all clinical situations. eGFR's persistently <60 mL/min signify possible Chronic Kidney Disease.   . Anion gap 11/25/2014 6  5 - 15 Final  . CA 27.29 11/25/2014 18.0  0.0 - 38.6 U/mL Final   Comment: (NOTE) [employer]/ACS methodology Performed At: [facility]  [place], [place] [id number] Lindon Romp MD [place]:[id number]     Assessment:  Jill Walsh is a 44 y.o. 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  No lymph nodes were removed.  Tumor was ER/PR negative and Her2/neu 3+.  Pathologic stage was T88mNx.  CA27.20 was 24.4 (0-38.6) on 04/30/2014 and 18.0 on 11/25/2014.  Left breast mammogram and ultrasound on 12/06/2014 revealed no mammographic or sonographic evidence of malignancy in the left breast.  There was an incidental note of 2 less than 4 mm benign appearing cysts at the 9:30 o'clock 9 cm and 10 cm from the nipple.  She has a mother who developed breast cancer at age 45  A maternal aunt has colon cancer.  MyRisk genetic testing revealed no clinically significant mutation.  Symptomatically, she continues to have left breast discomfort unrelieved by Motrin or Tylenol.  Exam is unremarkable.  She has mild hypocalcemia.  Plan: 1. Review interim labs and imaging studies. 2. Rx:  Tramadol 50 mg po q 12 hours prn pain. 3. Discuss calcium supplimentation. 4. RTC on 03/14/2015 for MD assessment and labs (CBC with diff, CMP, CA27.29).   Jill Asal MD  12/20/2014, 2:43 PM

## 2015-02-10 ENCOUNTER — Emergency Department
Admission: EM | Admit: 2015-02-10 | Discharge: 2015-02-10 | Disposition: A | Discharge status: Home / Self Care | Payer: BLUE CROSS/BLUE SHIELD | Attending: Emergency Medicine | Admitting: Emergency Medicine

## 2015-02-10 ENCOUNTER — Encounter: Payer: Self-pay | Admitting: Emergency Medicine

## 2015-02-10 DIAGNOSIS — F17210 Nicotine dependence, cigarettes, uncomplicated: Secondary | ICD-10-CM | POA: Insufficient documentation

## 2015-02-10 DIAGNOSIS — J209 Acute bronchitis, unspecified: Secondary | ICD-10-CM | POA: Diagnosis not present

## 2015-02-10 DIAGNOSIS — J040 Acute laryngitis: Secondary | ICD-10-CM | POA: Insufficient documentation

## 2015-02-10 DIAGNOSIS — R05 Cough: Secondary | ICD-10-CM | POA: Diagnosis present

## 2015-02-10 MED ORDER — AZITHROMYCIN 250 MG PO TABS: ORAL_TABLET | ORAL | Status: DC

## 2015-02-10 MED ORDER — HYDROCOD POLST-CPM POLST ER 10-8 MG/5ML PO SUER: 5.0000 mL | Freq: Two times a day (BID) | ORAL | Status: DC

## 2015-02-10 NOTE — ED Provider Notes (Signed)
[facility] Emergency Department Provider Note  ____________________________________________  Time seen: Approximately 2:00 PM  I have reviewed the triage vital signs and the nursing notes.   HISTORY  Chief Complaint Cough and Fever   HPI Jill Walsh is a 45 y.o. female is here with complaint of cough and congestion since Saturday. Patient states last night she had slight fever. She rates the sore throat has been coming from her nonproductive cough. She denies any ear pain or headache. She is taking over-the-counter Alka-Seltzer and Tylenol for her symptoms. Patient states that she gets bronchitis a couple times a year. Patient denies history of smoking at this time but has been a smoker in the past.Currently she rates her pain as a 9 out of 10.   Past Medical History  Diagnosis Date  . Breast cancer [facility])     right breast 2016    Patient Active Problem List   Diagnosis Date Noted  . Breast pain 12/20/2014  . Family history of breast cancer 05/28/2014  . Current smoker 05/28/2014  . Breast cancer ([place]) 05/28/2014    Past Surgical History  Procedure Laterality Date  . Tubal ligation    . Mastectomy Right     03/2014  . Breast biopsy Left     02/28/14 negative  . Breast biopsy Right      02/2014 malignant    Current Outpatient Rx  Name  Route  Sig  Dispense  Refill  . azithromycin (ZITHROMAX Z-PAK) 250 MG tablet      Take 2 tablets (500 mg) on  Day 1,  followed by 1 tablet (250 mg) once daily on Days 2 through 5.   6 each   0   . chlorpheniramine-HYDROcodone (TUSSIONEX PENNKINETIC ER) 10-8 MG/5ML SUER   Oral   Take 5 mLs by mouth 2 (two) times daily.   100 mL   0   . cyclobenzaprine (FLEXERIL) 10 MG tablet   Oral   Take 1 tablet (10 mg total) by mouth 2 (two) times daily as needed for muscle spasms. Patient not taking: Reported on 08/23/2014   20 tablet   0   . diphenhydramine-acetaminophen (TYLENOL PM) 25-500 MG TABS tablet    Oral   Take 1 tablet by mouth at bedtime as needed.         Marland Kitchen ibuprofen (ADVIL,MOTRIN) 200 MG tablet   Oral   Take 400 mg by mouth every 6 (six) hours as needed for pain.         . naproxen (NAPROSYN) 500 MG tablet   Oral   Take 1 tablet (500 mg total) by mouth 2 (two) times daily. Patient not taking: Reported on 11/25/2014   30 tablet   0   . traMADol (ULTRAM) 50 MG tablet   Oral   Take 1 tablet (50 mg total) by mouth every 12 (twelve) hours as needed for severe pain.   30 tablet   0     Allergies Review of patient's allergies indicates no known allergies.  Family History  Problem Relation Age of Onset  . Cancer Mother   . Breast cancer Mother 52  . Stroke Brother   . Cancer Maternal Aunt     Social History Social History  Substance Use Topics  . Smoking status: Current Every Day Smoker -- 0.25 packs/day for 20 years    Types: E-cigarettes  . Smokeless tobacco: None  . Alcohol Use: No    Review of Systems Constitutional: Positive fever/chills  Eyes: No visual changes. ENT: Positive sore throat. Cardiovascular: Denies chest pain. Respiratory: Denies shortness of breath. Positive nonproductive cough Gastrointestinal: No abdominal pain.  No nausea, no vomiting.  No diarrhea.   Musculoskeletal: Negative for back pain. Skin: Negative for rash. Neurological: Negative for headaches, focal weakness or numbness.  10-point ROS otherwise negative.  ____________________________________________   PHYSICAL EXAM:  VITAL SIGNS: ED Triage Vitals  Enc Vitals Group     BP 02/10/15 1350 132/90 mmHg     Pulse Rate 02/10/15 1350 110     Resp 02/10/15 1350 18     Temp 02/10/15 1350 98.1 F (36.7 C)     Temp Source 02/10/15 1350 Oral     SpO2 02/10/15 1350 99 %     Weight 02/10/15 1350 182 lb (82.555 kg)     Height 02/10/15 1350 5\' 5"  (1.651 m)     Head Cir --      Peak Flow --      Pain Score 02/10/15 1348 9     Pain Loc --      Pain Edu? --      Excl. in [place]?  --     Constitutional: Alert and oriented. Well appearing and in no acute distress. Eyes: Conjunctivae are normal. PERRL. EOMI. Head: Atraumatic. Nose: Mild congestion/no rhinnorhea.   EACs and TMs are clear bilaterally. Mouth/Throat: Mucous membranes are moist.  Oropharynx non-erythematous. Neck: No stridor.  Supple Hematological/Lymphatic/Immunilogical: No cervical lymphadenopathy. Cardiovascular: Normal rate, regular rhythm. Grossly normal heart sounds.  Good peripheral circulation. Respiratory: Normal respiratory effort.  No retractions. Lungs CTAB. Coarse congested cough is present. Patient also has laryngitis. Gastrointestinal: Soft and nontender. No distention. Musculoskeletal: No lower extremity tenderness nor edema.  No joint effusions. Neurologic:  Normal speech and language. No gross focal neurologic deficits are appreciated. No gait instability. Skin:  Skin is warm, dry and intact. No rash noted. Psychiatric: Mood and affect are normal. Speech and behavior are normal.  ____________________________________________   LABS (all labs ordered are listed, but only abnormal results are displayed)  Labs Reviewed - No data to display  PROCEDURES  Procedure(s) performed: None  Critical Care performed: No  ____________________________________________   INITIAL IMPRESSION / ASSESSMENT AND PLAN / ED COURSE  Pertinent labs & imaging results that were available during my care of the patient were reviewed by me and considered in my medical decision making (see chart for details).  Patient was given a prescription for Tussionex as needed for cough along with a Z-Pak. She is encouraged to continue fluids and follow up with her PCP or [facility] clinic if any continued problems. ____________________________________________   FINAL CLINICAL IMPRESSION(S) / ED DIAGNOSES  Final diagnoses:  Acute bronchitis, unspecified organism      Johnn Hai, PA-C 02/10/15  1538  Lavonia Drafts, MD 02/10/15 1547

## 2015-02-10 NOTE — Discharge Instructions (Signed)
Acute Bronchitis Bronchitis is when the airways that extend from the windpipe into the lungs get red, puffy, and painful (inflamed). Bronchitis often causes thick spit (mucus) to develop. This leads to a cough. A cough is the most common symptom of bronchitis. In acute bronchitis, the condition usually begins suddenly and goes away over time (usually in 2 weeks). Smoking, allergies, and asthma can make bronchitis worse. Repeated episodes of bronchitis may cause more lung problems. HOME CARE  Rest.  Drink enough fluids to keep your pee (urine) clear or pale yellow (unless you need to limit fluids as told by your doctor).  Only take over-the-counter or prescription medicines as told by your doctor.  Avoid smoking and secondhand smoke. These can make bronchitis worse. If you are a smoker, think about using nicotine gum or skin patches. Quitting smoking will help your lungs heal faster.  Reduce the chance of getting bronchitis again by:  Washing your hands often.  Avoiding people with cold symptoms.  Trying not to touch your hands to your mouth, nose, or eyes.  Follow up with your doctor as told. GET HELP IF: Your symptoms do not improve after 1 week of treatment. Symptoms include:  Cough.  Fever.  Coughing up thick spit.  Body aches.  Chest congestion.  Chills.  Shortness of breath.  Sore throat. GET HELP RIGHT AWAY IF:   You have an increased fever.  You have chills.  You have severe shortness of breath.  You have bloody thick spit (sputum).  You throw up (vomit) often.  You lose too much body fluid (dehydration).  You have a severe headache.  You faint. MAKE SURE YOU:   Understand these instructions.  Will watch your condition.  Will get help right away if you are not doing well or get worse.   This information is not intended to replace advice given to you by your health care provider. Make sure you discuss any questions you have with your health care  provider.   Document Released: 07/14/2007 Document Revised: 09/27/2012 Document Reviewed: 07/18/2012 Elsevier Interactive Patient Education 2016 [place] with Dr. Ronnald Ramp if any continued problems. Begin Tussionex every 12 hours as needed for cough, Zithromax for the next 5 days. Be aware that the Tussionex will cause drowsiness and should not be taken if you have planned to drive or operate machinery

## 2015-02-10 NOTE — ED Notes (Signed)
Pt to ed with c/o cough, congestion, sore throat, fever since sat.  Pt denies earache and denies headache.

## 2015-03-13 ENCOUNTER — Encounter: Payer: Self-pay | Admitting: Hematology and Oncology

## 2015-03-14 ENCOUNTER — Other Ambulatory Visit: Payer: Self-pay

## 2015-03-14 ENCOUNTER — Encounter: Payer: Self-pay | Admitting: Hematology and Oncology

## 2015-03-14 ENCOUNTER — Inpatient Hospital Stay (HOSPITAL_BASED_OUTPATIENT_CLINIC_OR_DEPARTMENT_OTHER): Payer: BLUE CROSS/BLUE SHIELD

## 2015-03-14 ENCOUNTER — Inpatient Hospital Stay: Payer: BLUE CROSS/BLUE SHIELD | Attending: Hematology and Oncology | Admitting: Hematology and Oncology

## 2015-03-14 VITALS — BP 136/91 | HR 108 | Temp 99.0°F | Resp 18 | Ht 65.0 in | Wt 196.7 lb

## 2015-03-14 DIAGNOSIS — Z79899 Other long term (current) drug therapy: Secondary | ICD-10-CM | POA: Diagnosis not present

## 2015-03-14 DIAGNOSIS — Z9011 Acquired absence of right breast and nipple: Secondary | ICD-10-CM | POA: Insufficient documentation

## 2015-03-14 DIAGNOSIS — C50911 Malignant neoplasm of unspecified site of right female breast: Secondary | ICD-10-CM

## 2015-03-14 DIAGNOSIS — F17210 Nicotine dependence, cigarettes, uncomplicated: Secondary | ICD-10-CM | POA: Diagnosis not present

## 2015-03-14 DIAGNOSIS — Z171 Estrogen receptor negative status [ER-]: Secondary | ICD-10-CM

## 2015-03-14 LAB — COMPREHENSIVE METABOLIC PANEL
ALT: 16 U/L (ref 14–54)
AST: 17 U/L (ref 15–41)
Albumin: 4.1 g/dL (ref 3.5–5.0)
Alkaline Phosphatase: 69 U/L (ref 38–126)
Anion gap: 7 (ref 5–15)
BUN: 9 mg/dL (ref 6–20)
CO2: 25 mmol/L (ref 22–32)
Calcium: 9.5 mg/dL (ref 8.9–10.3)
Chloride: 103 mmol/L (ref 101–111)
Creatinine, Ser: 0.85 mg/dL (ref 0.44–1.00)
GFR calc Af Amer: >60 mL/min (ref >60)
GFR calc non Af Amer: >60 mL/min (ref >60)
Glucose, Bld: 127 mg/dL — ABNORMAL HIGH (ref 65–99)
Potassium: 3.4 mmol/L — ABNORMAL LOW (ref 3.5–5.1)
Sodium: 135 mmol/L (ref 135–145)
Total Bilirubin: 0.4 mg/dL (ref 0.3–1.2)
Total Protein: 7.9 g/dL (ref 6.5–8.1)

## 2015-03-14 LAB — CBC WITH DIFFERENTIAL/PLATELET
Basophils Absolute: 0 10*3/uL (ref 0–0.1)
Basophils Relative: 1 %
Eosinophils Absolute: 0.1 10*3/uL (ref 0–0.7)
Eosinophils Relative: 2 %
HCT: 37.4 % (ref 35.0–47.0)
Hemoglobin: 12.7 g/dL (ref 12.0–16.0)
Lymphocytes Relative: 28 %
Lymphs Abs: 2.3 10*3/uL (ref 1.0–3.6)
MCH: 28.7 pg (ref 26.0–34.0)
MCHC: 34 g/dL (ref 32.0–36.0)
MCV: 84.3 fL (ref 80.0–100.0)
Monocytes Absolute: 0.4 10*3/uL (ref 0.2–0.9)
Monocytes Relative: 5 %
Neutro Abs: 5.2 10*3/uL (ref 1.4–6.5)
Neutrophils Relative %: 64 %
Platelets: 290 10*3/uL (ref 150–440)
RBC: 4.44 MIL/uL (ref 3.80–5.20)
RDW: 13.4 % (ref 11.5–14.5)
WBC: 8.1 10*3/uL (ref 3.6–11.0)

## 2015-03-14 NOTE — Progress Notes (Signed)
[place] [facility] day:  03/14/2015   Chief Complaint: Jill Walsh is a 45 y.o. 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  HPI: The patient was last seen in the medical oncology clinic on 12/20/2014.  At that time, she was seen for review of interval mammogram and ultrasound.  Left mammogram on 12/06/2014 revealed no mammographic evidence of malignancy in the left breast.  Targeted ultrasound revealed no suspicious masses or shadowing lesions.  There was an incidental note of 2 less than 4 mm benign appearing cysts at the 9:30 o'clock 9 cm and 10 cm from the nipple.  During the interim, she has done extremely well.  She denies any complaints.  She notes that her wire bra scratched her left breast.    Past Medical History  Diagnosis Date  . Breast cancer [facility])     right breast 2016    Past Surgical History  Procedure Laterality Date  . Tubal ligation    . Mastectomy Right     03/2014  . Breast biopsy Left     02/28/14 negative  . Breast biopsy Right      02/2014 malignant    Family History  Problem Relation Age of Onset  . Cancer Mother   . Breast cancer Mother 16  . Stroke Brother   . Cancer Maternal Aunt     Social History:  reports that she has been smoking E-cigarettes.  She has a 5 pack-year smoking history. She does not have any smokeless tobacco history on file. She reports that she does not drink alcohol. Her drug history is not on file.  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  The patient is alone today.  Allergies: No Known Allergies  Current Medications: Current Outpatient Prescriptions  Medication Sig Dispense Refill  . chlorpheniramine-HYDROcodone (TUSSIONEX PENNKINETIC ER) 10-8 MG/5ML SUER Take 5 mLs by mouth 2 (two) times daily. 100 mL 0  . cyclobenzaprine (FLEXERIL) 10 MG  tablet Take 1 tablet (10 mg total) by mouth 2 (two) times daily as needed for muscle spasms. 20 tablet 0  . diphenhydramine-acetaminophen (TYLENOL PM) 25-500 MG TABS tablet Take 1 tablet by mouth at bedtime as needed.    Marland Kitchen ibuprofen (ADVIL,MOTRIN) 200 MG tablet Take 400 mg by mouth every 6 (six) hours as needed for pain.    . naproxen (NAPROSYN) 500 MG tablet Take 1 tablet (500 mg total) by mouth 2 (two) times daily. 30 tablet 0  . traMADol (ULTRAM) 50 MG tablet Take 1 tablet (50 mg total) by mouth every 12 (twelve) hours as needed for severe pain. 30 tablet 0   No current facility-administered medications for this visit.    Review of Systems:  GENERAL:  Feels great.  No fevers, sweats or weight loss. PERFORMANCE STATUS (ECOG):  0 HEENT:  No visual changes, runny nose, sore throat, mouth sores or tenderness. Lungs:  No shortness of breath or cough.  No hemoptysis.  Bronchitis treated with azithromycin in 02/2015. Cardiac:  No chest pain, palpitations, orthopnea, or PND. Breasts:  No pain.  No skin changes or nipple discharge.  Scratch left breast with wire bra. GI:  No nausea, vomiting, diarrhea, constipation, melena or hematochezia. GU:  No urgency, frequency, dysuria, or hematuria. Musculoskeletal:  No back pain.  No joint pain.  No muscle tenderness. Extremities:  No pain or swelling. Skin:  No rashes or skin changes. Neuro:  No headache, numbness or weakness, balance or coordination issues. Endocrine:  No diabetes, thyroid issues, hot flashes or night sweats. Psych:  No mood changes, depression or anxiety. Pain:  No focal pain. Review of systems:  All other systems reviewed and found to be negative.  Physical Exam: Blood pressure 136/91, pulse 108, temperature 99 F (37.2 C), temperature source Tympanic, resp. rate 18, height 5' 5"  (1.651 m), weight 196 lb 10.4 oz (89.2 kg). GENERAL:  Well developed, well nourished, sitting comfortably in the exam room in no acute distress. MENTAL  STATUS:  Alert and oriented to person, place and time. HEAD:   Wearing a black cap.  Long brown hair.  Normocephalic, atraumatic, face symmetric, no Cushingoid features. EYES:  Glasses.  Brown eyes.  Pupils equal round and reactive to light and accomodation.  No conjunctivitis or scleral icterus. ENT:  Oropharynx clear without lesion.  Tongue normal. Mucous membranes moist.  RESPIRATORY:  Clear to auscultation without rales, wheezes or rhonchi. CARDIOVASCULAR:  Regular rate and rhythm without murmur, rub or gallop. BREAST:  Well healed right mastectomy without erythema, nodularity or skin changes.  Left breast with fibrocystic changes in the upper outer quadrant.  No masses, skin changes or nipple discharge. Healing elliptical scratch in the lower quadrants. ABDOMEN:  Soft, non-tender, with active bowel sounds, and no hepatosplenomegaly.  No masses. SKIN:  No rashes, ulcers or lesions. EXTREMITIES: No edema, no skin discoloration or tenderness.  No palpable cords. LYMPH NODES: No palpable cervical, supraclavicular, axillary or inguinal adenopathy  NEUROLOGICAL: Unremarkable. PSYCH:  Appropriate.    Appointment on 03/14/2015  Component Date Value Ref Range Status  . WBC 03/14/2015 8.1  3.6 - 11.0 K/uL Final  . RBC 03/14/2015 4.44  3.80 - 5.20 MIL/uL Final  . Hemoglobin 03/14/2015 12.7  12.0 - 16.0 g/dL Final  . HCT 03/14/2015 37.4  35.0 - 47.0 % Final  . MCV 03/14/2015 84.3  80.0 - 100.0 fL Final  . MCH 03/14/2015 28.7  26.0 - 34.0 pg Final  . MCHC 03/14/2015 34.0  32.0 - 36.0 g/dL Final  . RDW 03/14/2015 13.4  11.5 - 14.5 % Final  . Platelets 03/14/2015 290  150 - 440 K/uL Final  . Neutrophils Relative % 03/14/2015 64   Final  . Neutro Abs 03/14/2015 5.2  1.4 - 6.5 K/uL Final  . Lymphocytes Relative 03/14/2015 28   Final  . Lymphs Abs 03/14/2015 2.3  1.0 - 3.6 K/uL Final  . Monocytes Relative 03/14/2015 5   Final  . Monocytes Absolute 03/14/2015 0.4  0.2 - 0.9 K/uL Final  .  Eosinophils Relative 03/14/2015 2   Final  . Eosinophils Absolute 03/14/2015 0.1  0 - 0.7 K/uL Final  . Basophils Relative 03/14/2015 1   Final  . Basophils Absolute 03/14/2015 0.0  0 - 0.1 K/uL Final  . Sodium 03/14/2015 135  135 - 145 mmol/L Final  . Potassium 03/14/2015 3.4* 3.5 - 5.1 mmol/L Final  . Chloride 03/14/2015 103  101 - 111 mmol/L Final  . CO2 03/14/2015 25  22 - 32 mmol/L Final  . Glucose, Bld 03/14/2015 127* 65 - 99 mg/dL Final  . BUN 03/14/2015 9  6 - 20 mg/dL Final  . Creatinine, Ser 03/14/2015 0.85  0.44 - 1.00 mg/dL Final  . Calcium 03/14/2015 9.5  8.9 - 10.3 mg/dL Final  . Total Protein 03/14/2015 7.9  6.5 - 8.1 g/dL Final  .  Albumin 03/14/2015 4.1  3.5 - 5.0 g/dL Final  . AST 03/14/2015 17  15 - 41 U/L Final  . ALT 03/14/2015 16  14 - 54 U/L Final  . Alkaline Phosphatase 03/14/2015 69  38 - 126 U/L Final  . Total Bilirubin 03/14/2015 0.4  0.3 - 1.2 mg/dL Final  . GFR calc non Af Amer 03/14/2015 >60  >60 mL/min Final  . GFR calc Af Amer 03/14/2015 >60  >60 mL/min Final   Comment: (NOTE) The eGFR has been calculated using the CKD EPI equation. This calculation has not been validated in all clinical situations. eGFR's persistently <60 mL/min signify possible Chronic Kidney Disease.   . Anion gap 03/14/2015 7  5 - 15 Final    Assessment:  Jill Walsh is a 45 y.o. 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  No lymph nodes were removed.  Tumor was ER/PR negative and Her2/neu 3+.  Pathologic stage was T22mNx.  CA27.20 was 24.4 (0-38.6) on 04/30/2014 and 18.0 on 11/25/2014.  Left breast mammogram and ultrasound on 12/06/2014 revealed no mammographic or sonographic evidence of malignancy in the left breast.  There was an incidental note of 2 less than 4 mm benign appearing cysts at the 9:30 o'clock 9 cm and 10 cm from the nipple.  She has a mother who developed breast cancer at age 46  A maternal aunt  has colon cancer.  MyRisk genetic testing revealed no clinically significant mutation.  Symptomatically, she is doing well.  Exam is unremarkable.  Calcium is normal on supplimentation.  Her potassium is slightly low.  Plan: 1. Labs today:   CBC with diff, CMP, CA27.29. 2. Continue calcium supplimentation. 3. Discuss potassium rich foods. 4. RTC in 4 months for MD assessment and labs (CBC with diff, CMP, CA27.29).   MLequita Asal MD  03/14/2015, 2:37 PM

## 2015-03-15 LAB — CANCER ANTIGEN 27.29: CA 27.29: 26.8 U/mL (ref 0.0–38.6)

## 2015-04-07 ENCOUNTER — Encounter: Payer: Self-pay | Admitting: Emergency Medicine

## 2015-04-07 ENCOUNTER — Emergency Department
Admission: EM | Admit: 2015-04-07 | Discharge: 2015-04-07 | Disposition: A | Discharge status: Home / Self Care | Payer: BLUE CROSS/BLUE SHIELD | Attending: Emergency Medicine | Admitting: Emergency Medicine

## 2015-04-07 DIAGNOSIS — R05 Cough: Secondary | ICD-10-CM | POA: Diagnosis present

## 2015-04-07 DIAGNOSIS — J209 Acute bronchitis, unspecified: Secondary | ICD-10-CM | POA: Diagnosis not present

## 2015-04-07 DIAGNOSIS — Z791 Long term (current) use of non-steroidal anti-inflammatories (NSAID): Secondary | ICD-10-CM | POA: Insufficient documentation

## 2015-04-07 DIAGNOSIS — F17210 Nicotine dependence, cigarettes, uncomplicated: Secondary | ICD-10-CM | POA: Diagnosis not present

## 2015-04-07 DIAGNOSIS — Z79899 Other long term (current) drug therapy: Secondary | ICD-10-CM | POA: Insufficient documentation

## 2015-04-07 MED ORDER — PREDNISONE 10 MG PO TABS: 10.0000 mg | ORAL_TABLET | ORAL | Status: DC

## 2015-04-07 MED ORDER — ALBUTEROL SULFATE HFA 108 (90 BASE) MCG/ACT IN AERS: 2.0000 | INHALATION_SPRAY | RESPIRATORY_TRACT | Status: DC | PRN

## 2015-04-07 MED ORDER — AZITHROMYCIN 250 MG PO TABS
ORAL_TABLET | ORAL | Status: DC
Start: 2015-04-07 — End: 2015-07-14

## 2015-04-07 MED ORDER — PSEUDOEPH-BROMPHEN-DM 30-2-10 MG/5ML PO SYRP: 10.0000 mL | ORAL_SOLUTION | Freq: Four times a day (QID) | ORAL | Status: DC | PRN

## 2015-04-07 NOTE — ED Provider Notes (Signed)
[facility] Emergency Department Provider Note  ____________________________________________  Time seen: Approximately 3:08 PM  I have reviewed the triage vital signs and the nursing notes.   HISTORY  Chief Complaint Cough    HPI Jill Walsh is a 45 y.o. female who presents with a nonproducough since Thursday. The patient states that the cough occurs constantly throughout the day. She had a fever on Friday but this improved with Tylenol. She also has a headache and irritation of the throat secondary to the cough. She denies rhinorrhea, chills, nausea, vomiting, diarrhea, night sweats, dyspnea and chest pain. The patient notes that her symptoms are worse at night with lying down. She has been having to prop herself up with pillows in order to sleep. The patient has also used Nyquil, Dayquil, and Mucinex without improvement. She ranks the severity of her cough to be an 8/10. The patient notes that she was diagnosed with bronchitis in January and was placed on azithromycin and hydrocodone at that time, which helped her significantly. Immunizations are UTD.   Past Medical History  Diagnosis Date  . Breast cancer [facility])     right breast 2016    Patient Active Problem List   Diagnosis Date Noted  . Breast pain 12/20/2014  . Family history of breast cancer 05/28/2014  . Current smoker 05/28/2014  . Breast cancer ([place]) 05/28/2014    Past Surgical History  Procedure Laterality Date  . Tubal ligation    . Mastectomy Right     03/2014  . Breast biopsy Left     02/28/14 negative  . Breast biopsy Right      02/2014 malignant    Current Outpatient Rx  Name  Route  Sig  Dispense  Refill  . albuterol (PROVENTIL HFA;VENTOLIN HFA) 108 (90 Base) MCG/ACT inhaler   Inhalation   Inhale 2 puffs into the lungs every 4 (four) hours as needed for wheezing or shortness of breath.   1 Inhaler   0   . azithromycin (ZITHROMAX Z-PAK) 250 MG tablet      Take 2 tablets (500  mg) on  Day 1,  followed by 1 tablet (250 mg) once daily on Days 2 through 5.   6 each   0   . brompheniramine-pseudoephedrine-DM 30-2-10 MG/5ML syrup   Oral   Take 10 mLs by mouth 4 (four) times daily as needed.   200 mL   0   . chlorpheniramine-HYDROcodone (TUSSIONEX PENNKINETIC ER) 10-8 MG/5ML SUER   Oral   Take 5 mLs by mouth 2 (two) times daily.   100 mL   0   . cyclobenzaprine (FLEXERIL) 10 MG tablet   Oral   Take 1 tablet (10 mg total) by mouth 2 (two) times daily as needed for muscle spasms.   20 tablet   0   . diphenhydramine-acetaminophen (TYLENOL PM) 25-500 MG TABS tablet   Oral   Take 1 tablet by mouth at bedtime as needed.         Marland Kitchen ibuprofen (ADVIL,MOTRIN) 200 MG tablet   Oral   Take 400 mg by mouth every 6 (six) hours as needed for pain.         . naproxen (NAPROSYN) 500 MG tablet   Oral   Take 1 tablet (500 mg total) by mouth 2 (two) times daily.   30 tablet   0   . predniSONE (DELTASONE) 10 MG tablet   Oral   Take 1 tablet (10 mg total) by mouth as directed.  21 tablet   0     Take on a daily basis of 6, 5, 4, 3, 2, 1   . traMADol (ULTRAM) 50 MG tablet   Oral   Take 1 tablet (50 mg total) by mouth every 12 (twelve) hours as needed for severe pain.   30 tablet   0     Allergies Review of patient's allergies indicates no known allergies.  Family History  Problem Relation Age of Onset  . Cancer Mother   . Breast cancer Mother 7  . Stroke Brother   . Cancer Maternal Aunt     Social History Social History  Substance Use Topics  . Smoking status: Current Every Day Smoker -- 0.25 packs/day for 20 years    Types: E-cigarettes  . Smokeless tobacco: None  . Alcohol Use: No    Review of Systems Constitutional: No fever/chills Eyes: No visual changes, redness or tearing. ENT: Throat irritation secondary to cough. No swelling or stiffness of neck. No earaches.  Cardiovascular: Denies chest pain. Respiratory: Denies shortness of  breath and dyspnea on exertion.  Gastrointestinal: No abdominal pain.  No nausea, no vomiting.  No diarrhea.   Neurological: Headache secondary to cough. 10-point ROS otherwise negative.  ____________________________________________   PHYSICAL EXAM:  VITAL SIGNS: ED Triage Vitals  Enc Vitals Group     BP 04/07/15 1330 153/86 mmHg     Pulse Rate 04/07/15 1330 99     Resp 04/07/15 1330 18     Temp 04/07/15 1330 98.6 F (37 C)     Temp Source 04/07/15 1330 Oral     SpO2 04/07/15 1330 99 %     Weight 04/07/15 1330 192 lb (87.091 kg)     Height 04/07/15 1330 5\' 5"  (1.651 m)     Head Cir --      Peak Flow --      Pain Score --      Pain Loc --      Pain Edu? --      Excl. in [place]? --     Constitutional: Alert and oriented. Well appearing and in no acute distress. Eyes: Conjunctivae are normal.  Head: Atraumatic. Nose: No congestion/rhinnorhea. Mouth/Throat: Mucous membranes are moist.  Oropharynx mildly erythematous. Hematological/Lymphatic/Immunilogical: No cervical lymphadenopathy. Cardiovascular: Normal rate, regular rhythm. Grossly normal heart sounds.  Good peripheral circulation. Respiratory: Normal respiratory effort.  No retractions. No rhonchi, rales, or wheezing bilaterally.  Gastrointestinal: Soft and nontender. No distention. No abdominal bruits.  Neurologic:  Normal speech and language. No gross focal neurologic deficits are appreciated.  Skin:  Skin is warm, dry and intact. No rash noted. Psychiatric: Mood and affect are normal. Speech and behavior are normal.  ____________________________________________   LABS (all labs ordered are listed, but only abnormal results are displayed)  Labs Reviewed - No data to display ____________________________________________  EKG   ____________________________________________  RADIOLOGY   ____________________________________________   PROCEDURES  Procedure(s) performed: None  Critical Care performed:  No  ____________________________________________   INITIAL IMPRESSION / ASSESSMENT AND PLAN / ED COURSE  Pertinent labs & imaging results that were available during my care of the patient were reviewed by me and considered in my medical decision making (see chart for details).  His diagnosis is consistent with bronchitis. Patient only placed on antibiotics, albuterol, steroids, and cough syrup. Patient will follow-up with primary care for any symptoms persisting past this treatment course. Patient verbalizes understanding of diagnosis and treatment plan verbalizes compliance with same. ____________________________________________  FINAL CLINICAL IMPRESSION(S) / ED DIAGNOSES  Final diagnoses:  Acute bronchitis, unspecified organism      Darletta Moll, PA-C 04/07/15 [place], MD 04/10/15 [phone]

## 2015-04-07 NOTE — Discharge Instructions (Signed)

## 2015-04-07 NOTE — ED Notes (Signed)
C/o cough x 3 days.  No resp distress

## 2015-07-01 ENCOUNTER — Telehealth: Payer: Self-pay | Admitting: *Deleted

## 2015-07-01 NOTE — Telephone Encounter (Signed)
Pt came by yest. To see if form has been completed that she dropped off yest.  I told her no.  i would look at it and call if I had questions.  I called her back to see what issues she is having to have a note from md for work. She states that she had mastectomy and on that side when she does a lot of moving and stretching on that side it starts pulling and hurting her.  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  All of the repetitive motions with the wt of things she lifts hurts and it takes a while for the pain to calm down and after 3 days in a row of doing this she has to take a day off to ease her site of mastectomy.  I did speak to [place] and she was agreeable to write her a note stating that pt may need to take time off when she works long shifts and has continuous repetition of picking up boxes that weigh over 10 lbs on a daily basis.  Filled out form and notified pt that she can come pick it up and pt will come over

## 2015-07-14 ENCOUNTER — Inpatient Hospital Stay: Payer: BLUE CROSS/BLUE SHIELD | Attending: Hematology and Oncology

## 2015-07-14 ENCOUNTER — Inpatient Hospital Stay (HOSPITAL_BASED_OUTPATIENT_CLINIC_OR_DEPARTMENT_OTHER): Payer: BLUE CROSS/BLUE SHIELD | Admitting: Hematology and Oncology

## 2015-07-14 VITALS — BP 155/92 | HR 106 | Temp 97.9°F | Resp 18 | Wt 191.1 lb

## 2015-07-14 DIAGNOSIS — C50911 Malignant neoplasm of unspecified site of right female breast: Secondary | ICD-10-CM | POA: Diagnosis not present

## 2015-07-14 DIAGNOSIS — F17210 Nicotine dependence, cigarettes, uncomplicated: Secondary | ICD-10-CM

## 2015-07-14 DIAGNOSIS — Z171 Estrogen receptor negative status [ER-]: Secondary | ICD-10-CM

## 2015-07-14 DIAGNOSIS — Z79899 Other long term (current) drug therapy: Secondary | ICD-10-CM | POA: Insufficient documentation

## 2015-07-14 DIAGNOSIS — Z9011 Acquired absence of right breast and nipple: Secondary | ICD-10-CM | POA: Diagnosis not present

## 2015-07-14 LAB — CBC WITH DIFFERENTIAL/PLATELET
Basophils Absolute: 0.1 10*3/uL (ref 0–0.1)
Basophils Relative: 1 %
Eosinophils Absolute: 0.1 10*3/uL (ref 0–0.7)
Eosinophils Relative: 1 %
HCT: 37.2 % (ref 35.0–47.0)
Hemoglobin: 12.7 g/dL (ref 12.0–16.0)
Lymphocytes Relative: 31 %
Lymphs Abs: 2.9 10*3/uL (ref 1.0–3.6)
MCH: 28.5 pg (ref 26.0–34.0)
MCHC: 34.1 g/dL (ref 32.0–36.0)
MCV: 83.6 fL (ref 80.0–100.0)
Monocytes Absolute: 0.6 10*3/uL (ref 0.2–0.9)
Monocytes Relative: 6 %
Neutro Abs: 5.8 10*3/uL (ref 1.4–6.5)
Neutrophils Relative %: 61 %
Platelets: 287 10*3/uL (ref 150–440)
RBC: 4.45 MIL/uL (ref 3.80–5.20)
RDW: 13.9 % (ref 11.5–14.5)
WBC: 9.5 10*3/uL (ref 3.6–11.0)

## 2015-07-14 LAB — COMPREHENSIVE METABOLIC PANEL
ALT: 15 U/L (ref 14–54)
AST: 17 U/L (ref 15–41)
Albumin: 4.1 g/dL (ref 3.5–5.0)
Alkaline Phosphatase: 69 U/L (ref 38–126)
Anion gap: 5 (ref 5–15)
BUN: 15 mg/dL (ref 6–20)
CO2: 28 mmol/L (ref 22–32)
Calcium: 9.5 mg/dL (ref 8.9–10.3)
Chloride: 104 mmol/L (ref 101–111)
Creatinine, Ser: 0.93 mg/dL (ref 0.44–1.00)
GFR calc Af Amer: >60 mL/min (ref >60)
GFR calc non Af Amer: >60 mL/min (ref >60)
Glucose, Bld: 107 mg/dL — ABNORMAL HIGH (ref 65–99)
Potassium: 3.7 mmol/L (ref 3.5–5.1)
Sodium: 137 mmol/L (ref 135–145)
Total Bilirubin: 0.4 mg/dL (ref 0.3–1.2)
Total Protein: 7.9 g/dL (ref 6.5–8.1)

## 2015-07-14 NOTE — Progress Notes (Signed)
[place] [facility] day:  07/14/2015   Chief Complaint: Jill Walsh is a 45 y.o. 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  HPI: The patient was last seen in the medical oncology clinic on 03/14/2015.  At that time, she was doing well.  She denied any complaints.  Exam was unremarkable.  CBC with diff, CMP, and CA27.29 (26.8) were normal.  During the interim, she has felt good.  She notes being sore at work secondary to lifting boxes.  She takes Tylenol prn.   Past Medical History  Diagnosis Date  . Breast cancer [facility])     right breast 2016    Past Surgical History  Procedure Laterality Date  . Tubal ligation    . Mastectomy Right     03/2014  . Breast biopsy Left     02/28/14 negative  . Breast biopsy Right      02/2014 malignant    Family History  Problem Relation Age of Onset  . Cancer Mother   . Breast cancer Mother 32  . Stroke Brother   . Cancer Maternal Aunt     Social History:  reports that she has been smoking E-cigarettes.  She has a 5 pack-year smoking history. She does not have any smokeless tobacco history on file. She reports that she does not drink alcohol. Her drug history is not on file.  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  The patient is accompanied by her fiance, [name], today.  Allergies: No Known Allergies  Current Medications: Current Outpatient Prescriptions  Medication Sig Dispense Refill  . acetaminophen (TYLENOL) 325 MG tablet Take 650 mg by mouth every 6 (six) hours as needed.     No current facility-administered medications for this visit.    Review of Systems:  GENERAL:  Feels good.  No fevers, sweats or weight loss. PERFORMANCE STATUS (ECOG):  0 HEENT:  No visual changes, runny nose, sore throat, mouth sores or tenderness. Lungs:  No shortness of  breath or cough.  No hemoptysis.  Cardiac:  No chest pain, palpitations, orthopnea, or PND. Breasts:  No pain.  No skin changes or nipple discharge.  Scratch left breast with wire bra. GI:  No nausea, vomiting, diarrhea, constipation, melena or hematochezia. GU:  No urgency, frequency, dysuria, or hematuria. Musculoskeletal:  No back pain.  No joint pain.  No muscle tenderness. Extremities:  No pain or swelling. Skin:  No rashes or skin changes. Neuro:  No headache, numbness or weakness, balance or coordination issues. Endocrine:  No diabetes, thyroid issues, hot flashes or night sweats. Psych:  No mood changes, depression or anxiety. Pain:  No focal pain. Review of systems:  All other systems reviewed and found to be negative.  Physical Exam: Blood pressure 155/92, pulse 106, temperature 97.9 F (36.6 C), temperature source Tympanic, resp. rate 18, weight 191 lb 2.2 oz (86.7 kg). GENERAL:  Well developed, well nourished, woman sitting comfortably in the exam room in no acute distress. MENTAL STATUS:  Alert and oriented to person, place and time. HEAD:   Long slightly wavy brown hair.  Normocephalic, atraumatic, face symmetric, no Cushingoid features. EYES:  Glasses.  Brown eyes.  Pupils equal round and reactive to light and accomodation.  No conjunctivitis or scleral icterus. ENT:  Oropharynx clear without lesion.  Tongue normal. Mucous membranes moist.  RESPIRATORY:  Clear to auscultation without rales, wheezes or rhonchi. CARDIOVASCULAR:  Regular rate and rhythm without murmur, rub or gallop. BREAST:  Well healed right mastectomy without erythema, nodularity or skin changes.  Left breast with fibrocystic changes in the upper outer quadrant.  No masses, skin changes or nipple discharge.  ABDOMEN:  Soft, non-tender, with active bowel sounds, and no hepatosplenomegaly.  No masses. SKIN:  No rashes, ulcers or lesions. EXTREMITIES: No edema, no skin discoloration or tenderness.  No palpable  cords. LYMPH NODES: No palpable cervical, supraclavicular, axillary or inguinal adenopathy  NEUROLOGICAL: Unremarkable. PSYCH:  Appropriate.    Appointment on 07/14/2015  Component Date Value Ref Range Status  . WBC 07/14/2015 9.5  3.6 - 11.0 K/uL Final  . RBC 07/14/2015 4.45  3.80 - 5.20 MIL/uL Final  . Hemoglobin 07/14/2015 12.7  12.0 - 16.0 g/dL Final  . HCT 07/14/2015 37.2  35.0 - 47.0 % Final  . MCV 07/14/2015 83.6  80.0 - 100.0 fL Final  . MCH 07/14/2015 28.5  26.0 - 34.0 pg Final  . MCHC 07/14/2015 34.1  32.0 - 36.0 g/dL Final  . RDW 07/14/2015 13.9  11.5 - 14.5 % Final  . Platelets 07/14/2015 287  150 - 440 K/uL Final  . Neutrophils Relative % 07/14/2015 61   Final  . Neutro Abs 07/14/2015 5.8  1.4 - 6.5 K/uL Final  . Lymphocytes Relative 07/14/2015 31   Final  . Lymphs Abs 07/14/2015 2.9  1.0 - 3.6 K/uL Final  . Monocytes Relative 07/14/2015 6   Final  . Monocytes Absolute 07/14/2015 0.6  0.2 - 0.9 K/uL Final  . Eosinophils Relative 07/14/2015 1   Final  . Eosinophils Absolute 07/14/2015 0.1  0 - 0.7 K/uL Final  . Basophils Relative 07/14/2015 1   Final  . Basophils Absolute 07/14/2015 0.1  0 - 0.1 K/uL Final  . Sodium 07/14/2015 137  135 - 145 mmol/L Final  . Potassium 07/14/2015 3.7  3.5 - 5.1 mmol/L Final  . Chloride 07/14/2015 104  101 - 111 mmol/L Final  . CO2 07/14/2015 28  22 - 32 mmol/L Final  . Glucose, Bld 07/14/2015 107* 65 - 99 mg/dL Final  . BUN 07/14/2015 15  6 - 20 mg/dL Final  . Creatinine, Ser 07/14/2015 0.93  0.44 - 1.00 mg/dL Final  . Calcium 07/14/2015 9.5  8.9 - 10.3 mg/dL Final  . Total Protein 07/14/2015 7.9  6.5 - 8.1 g/dL Final  . Albumin 07/14/2015 4.1  3.5 - 5.0 g/dL Final  . AST 07/14/2015 17  15 - 41 U/L Final  . ALT 07/14/2015 15  14 - 54 U/L Final  . Alkaline Phosphatase 07/14/2015 69  38 - 126 U/L Final  . Total Bilirubin 07/14/2015 0.4  0.3 - 1.2 mg/dL Final  . GFR calc non Af Amer 07/14/2015 >60  >60 mL/min Final  . GFR calc Af Amer  07/14/2015 >60  >60 mL/min Final   Comment: (NOTE) The eGFR has been calculated using the CKD EPI equation. This calculation has not been validated in all clinical situations. eGFR's persistently <60 mL/min signify possible Chronic Kidney Disease.   . Anion gap 07/14/2015 5  5 - 15 Final    Assessment:  Bless Lisenby is a 45 y.o. 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  No lymph nodes were removed.  Tumor was ER/PR negative and Her2/neu 3+.  Pathologic stage was T83mNx.    Left breast mammogram and  ultrasound on 12/06/2014 revealed no mammographic or sonographic evidence of malignancy in the left breast.  There was an incidental note of 2 less than 4 mm benign appearing cysts at the 9:30 o'clock 9 cm and 10 cm from the nipple.  CA27.29 was 24.4 (0-38.6) on 04/30/2014, 18.0 on 11/25/2014, 26.8 on 03/14/2015, and 20.6 on 07/14/2015.  She has a mother who developed breast cancer at age 41.  A maternal aunt has colon cancer.  MyRisk genetic testing revealed no clinically significant mutation.  Symptomatically, she is doing well.  Exam is stable.    Plan: 1.  Labs today:   CBC with diff, CMP, CA27.29. 2.  Left mammogram 12/08/2015. 3.  RTC in 4 months for MD assessment and labs (CBC with diff, CMP, CA27.29).   Lequita Asal, MD  07/14/2015, 3:25 PM

## 2015-07-14 NOTE — Progress Notes (Signed)
States is feeling well. Offers no complaints. 

## 2015-07-15 LAB — CANCER ANTIGEN 27.29: CA 27.29: 20.6 U/mL (ref 0.0–38.6)

## 2015-07-16 ENCOUNTER — Encounter: Payer: Self-pay | Admitting: Hematology and Oncology

## 2015-09-23 ENCOUNTER — Encounter: Payer: Self-pay | Admitting: Emergency Medicine

## 2015-09-23 ENCOUNTER — Emergency Department: Payer: BLUE CROSS/BLUE SHIELD

## 2015-09-23 ENCOUNTER — Emergency Department
Admission: EM | Admit: 2015-09-23 | Discharge: 2015-09-23 | Disposition: A | Discharge status: Home / Self Care | Payer: BLUE CROSS/BLUE SHIELD | Attending: Emergency Medicine | Admitting: Emergency Medicine

## 2015-09-23 DIAGNOSIS — Z87891 Personal history of nicotine dependence: Secondary | ICD-10-CM | POA: Insufficient documentation

## 2015-09-23 DIAGNOSIS — J410 Simple chronic bronchitis: Secondary | ICD-10-CM

## 2015-09-23 DIAGNOSIS — R05 Cough: Secondary | ICD-10-CM | POA: Diagnosis present

## 2015-09-23 DIAGNOSIS — Z853 Personal history of malignant neoplasm of breast: Secondary | ICD-10-CM | POA: Insufficient documentation

## 2015-09-23 MED ORDER — PREDNISONE 10 MG PO TABS: ORAL_TABLET | ORAL | 0 refills | Status: DC

## 2015-09-23 MED ORDER — ALBUTEROL SULFATE HFA 108 (90 BASE) MCG/ACT IN AERS: 2.0000 | INHALATION_SPRAY | Freq: Four times a day (QID) | RESPIRATORY_TRACT | 2 refills | Status: DC | PRN

## 2015-09-23 MED ORDER — BENZONATATE 100 MG PO CAPS: 200.0000 mg | ORAL_CAPSULE | Freq: Three times a day (TID) | ORAL | 0 refills | Status: AC

## 2015-09-23 NOTE — ED Provider Notes (Signed)
[facility] Emergency Department Provider Note   ____________________________________________   First MD Initiated Contact with Patient 09/23/15 1118     (approximate)  I have reviewed the triage vital signs and the nursing notes.   HISTORY  Chief Complaint Cough   HPI Jill Walsh is a 45 y.o. female as well as complaint of nonproductive cough and congestion along with body aches that started 2 days ago. Patient states that she has had bronchitis in the past. Patient recently was told she had breast cancer and soon after that discontinue smoking. Patient is unaware of any history of asthma and currently does not take any medications for COPD. Patient states she has tried multiple medications over-the-counter without any relief of her coughing or congestion. Patient admits to continued use of E- cigarettes. She states the cough is very aggravating. Currently she rates her pain as 8 out of 10.    Past Medical History:  Diagnosis Date  . Breast cancer [facility])    right breast 2016    Patient Active Problem List   Diagnosis Date Noted  . Breast pain 12/20/2014  . Family history of breast cancer 05/28/2014  . Current smoker 05/28/2014  . Breast cancer ([place]) 05/28/2014  . Breast cancer, right breast ([place]) 03/28/2014    Past Surgical History:  Procedure Laterality Date  . BREAST BIOPSY Left    02/28/14 negative  . BREAST BIOPSY Right     02/2014 malignant  . MASTECTOMY Right    03/2014  . TUBAL LIGATION      Prior to Admission medications   Medication Sig Start Date End Date Taking? Authorizing Provider  acetaminophen (TYLENOL) 325 MG tablet Take 650 mg by mouth every 6 (six) hours as needed.    Historical Provider, MD  albuterol (PROVENTIL HFA;VENTOLIN HFA) 108 (90 Base) MCG/ACT inhaler Inhale 2 puffs into the lungs every 6 (six) hours as needed for wheezing or shortness of breath. 09/23/15   Johnn Hai, PA-C  benzonatate (TESSALON PERLES)  100 MG capsule Take 2 capsules (200 mg total) by mouth 3 (three) times daily. 09/23/15 09/22/16  Johnn Hai, PA-C  predniSONE (DELTASONE) 10 MG tablet Take 6 tablets  today, on day 2 take 5 tablets, day 3 take 4 tablets, day 4 take 3 tablets, day 5 take  2 tablets and 1 tablet the last day 09/23/15   Johnn Hai, PA-C    Allergies Review of patient's allergies indicates no known allergies.  Family History  Problem Relation Age of Onset  . Cancer Mother   . Breast cancer Mother 59  . Stroke Brother   . Cancer Maternal Aunt     Social History Social History  Substance Use Topics  . Smoking status: Former Smoker    Years: 15.00  . Smokeless tobacco: Former Systems developer  . Alcohol use No    Review of Systems Constitutional: Questionable fever/chills Eyes: No visual changes. ENT: No sore throat. Denies ear pain. Cardiovascular: Denies chest pain. Respiratory: Denies shortness of breath. Positive for nonproductive cough. Gastrointestinal: No abdominal pain.  No nausea, no vomiting.  Musculoskeletal: Negative for back pain. Skin: Negative for rash. Neurological: Negative for headaches, focal weakness or numbness.  10-point ROS otherwise negative.  ____________________________________________   PHYSICAL EXAM:  VITAL SIGNS: ED Triage Vitals  Enc Vitals Group     BP 09/23/15 1056 (!) 151/81     Pulse Rate 09/23/15 1056 65     Resp 09/23/15 1056 18  Temp 09/23/15 1056 97.6 F (36.4 C)     Temp Source 09/23/15 1056 Oral     SpO2 09/23/15 1056 100 %     Weight 09/23/15 1057 181 lb (82.1 kg)     Height 09/23/15 1057 5\' 5"  (1.651 m)     Head Circumference --      Peak Flow --      Pain Score 09/23/15 1104 8     Pain Loc --      Pain Edu? --      Excl. in [place]? --     Constitutional: Alert and oriented. Well appearing and in no acute distress. Eyes: Conjunctivae are normal. PERRL. EOMI. Head: Atraumatic. Nose: Mild congestion/rhinnorhea.  EACs are clear bilaterally.  TMs are dull but without erythema or injection. Mouth/Throat: Mucous membranes are moist.  Oropharynx non-erythematous. Neck: No stridor.   Hematological/Lymphatic/Immunilogical: No cervical lymphadenopathy. Cardiovascular: Normal rate, regular rhythm. Grossly normal heart sounds.  Good peripheral circulation. Respiratory: Normal respiratory effort.  No retractions. Lungs CTAB. Coarse cough is heard occasionally. Gastrointestinal: Soft and nontender. No distention.  No CVA tenderness. Musculoskeletal: Moves upper and lower extremities without any difficulty. Normal gait was noted. Neurologic:  Normal speech and language. No gross focal neurologic deficits are appreciated. No gait instability. Skin:  Skin is warm, dry and intact. No rash noted. Psychiatric: Mood and affect are normal. Speech and behavior are normal.  ____________________________________________   LABS (all labs ordered are listed, but only abnormal results are displayed)  Labs Reviewed - No data to display  RADIOLOGY  Chest x-ray per radiologist showed no acute cardiopulmonary disease. I, Johnn Hai, personally viewed and evaluated these images (plain radiographs) as part of my medical decision making, as well as reviewing the written report by the radiologist. ____________________________________________   PROCEDURES  Procedure(s) performed: None  Procedures  Critical Care performed: No  ____________________________________________   INITIAL IMPRESSION / ASSESSMENT AND PLAN / ED COURSE  Pertinent labs & imaging results that were available during my care of the patient were reviewed by me and considered in my medical decision making (see chart for details).    Clinical Course   Albuterol inhaler was prescribed for this patient along with a tapering dose of prednisone starting at 60 mg. Patient was also given a prescription for Tessalon Perles 1 or 2 every 8 hours as needed for cough. Patient is to  follow-up with her primary care doctor if any continued problems.  ____________________________________________   FINAL CLINICAL IMPRESSION(S) / ED DIAGNOSES  Final diagnoses:  Simple chronic bronchitis ([place])      NEW MEDICATIONS STARTED DURING THIS VISIT:  Discharge Medication List as of 09/23/2015 12:22 PM    START taking these medications   Details  albuterol (PROVENTIL HFA;VENTOLIN HFA) 108 (90 Base) MCG/ACT inhaler Inhale 2 puffs into the lungs every 6 (six) hours as needed for wheezing or shortness of breath., Starting Tue 09/23/2015, Print    benzonatate (TESSALON PERLES) 100 MG capsule Take 2 capsules (200 mg total) by mouth 3 (three) times daily., Starting Tue 09/23/2015, Until Wed 09/22/2016, Print    predniSONE (DELTASONE) 10 MG tablet Take 6 tablets  today, on day 2 take 5 tablets, day 3 take 4 tablets, day 4 take 3 tablets, day 5 take  2 tablets and 1 tablet the last day, Print         Note:  This document was prepared using Dragon voice recognition software and may include unintentional dictation errors.  Johnn Hai, PA-C 09/23/15 1357    Carrie Mew, MD 09/23/15 [phone]

## 2015-09-23 NOTE — Discharge Instructions (Signed)
Begin taking medication today as directed. Make an appointment to follow-up with your primary care doctor next week. Proventil inhaler for cough and wheezing, Tessalon Perles 1 or 2 every 8 hours for cough, and prednisone taper as directed.

## 2015-09-23 NOTE — ED Triage Notes (Signed)
Pt complains of cough, congestion and body aches that started on Saturday. Pt denies fevers at this time. Pt states she has tried multiple OTC meds with no relief.

## 2015-12-08 ENCOUNTER — Ambulatory Visit
Admission: RE | Admit: 2015-12-08 | Discharge: 2015-12-08 | Disposition: A | Discharge status: Home / Self Care | Payer: BLUE CROSS/BLUE SHIELD | Source: Ambulatory Visit | Attending: Hematology and Oncology | Admitting: Hematology and Oncology

## 2015-12-08 DIAGNOSIS — Z9011 Acquired absence of right breast and nipple: Secondary | ICD-10-CM | POA: Insufficient documentation

## 2015-12-08 DIAGNOSIS — C50911 Malignant neoplasm of unspecified site of right female breast: Secondary | ICD-10-CM | POA: Insufficient documentation

## 2015-12-11 ENCOUNTER — Other Ambulatory Visit: Payer: Self-pay | Admitting: *Deleted

## 2015-12-11 ENCOUNTER — Telehealth: Payer: Self-pay | Admitting: *Deleted

## 2015-12-11 ENCOUNTER — Inpatient Hospital Stay: Payer: BLUE CROSS/BLUE SHIELD | Attending: Hematology and Oncology

## 2015-12-11 ENCOUNTER — Encounter: Payer: Self-pay | Admitting: *Deleted

## 2015-12-11 ENCOUNTER — Inpatient Hospital Stay (HOSPITAL_BASED_OUTPATIENT_CLINIC_OR_DEPARTMENT_OTHER): Payer: BLUE CROSS/BLUE SHIELD | Admitting: Hematology and Oncology

## 2015-12-11 VITALS — BP 130/88 | HR 98 | Temp 98.1°F | Resp 18 | Wt 196.2 lb

## 2015-12-11 DIAGNOSIS — Z803 Family history of malignant neoplasm of breast: Secondary | ICD-10-CM | POA: Diagnosis not present

## 2015-12-11 DIAGNOSIS — Z9011 Acquired absence of right breast and nipple: Secondary | ICD-10-CM

## 2015-12-11 DIAGNOSIS — Z171 Estrogen receptor negative status [ER-]: Secondary | ICD-10-CM | POA: Insufficient documentation

## 2015-12-11 DIAGNOSIS — C50911 Malignant neoplasm of unspecified site of right female breast: Secondary | ICD-10-CM | POA: Diagnosis not present

## 2015-12-11 DIAGNOSIS — Z87891 Personal history of nicotine dependence: Secondary | ICD-10-CM | POA: Insufficient documentation

## 2015-12-11 DIAGNOSIS — N644 Mastodynia: Secondary | ICD-10-CM

## 2015-12-11 LAB — CBC WITH DIFFERENTIAL/PLATELET
Basophils Absolute: 0.1 10*3/uL (ref 0–0.1)
Basophils Relative: 1 %
Eosinophils Absolute: 0.2 10*3/uL (ref 0–0.7)
Eosinophils Relative: 2 %
HCT: 35.5 % (ref 35.0–47.0)
Hemoglobin: 12 g/dL (ref 12.0–16.0)
Lymphocytes Relative: 23 %
Lymphs Abs: 2.5 10*3/uL (ref 1.0–3.6)
MCH: 28 pg (ref 26.0–34.0)
MCHC: 33.8 g/dL (ref 32.0–36.0)
MCV: 82.9 fL (ref 80.0–100.0)
Monocytes Absolute: 0.7 10*3/uL (ref 0.2–0.9)
Monocytes Relative: 6 %
Neutro Abs: 7.3 10*3/uL — ABNORMAL HIGH (ref 1.4–6.5)
Neutrophils Relative %: 68 %
Platelets: 287 10*3/uL (ref 150–440)
RBC: 4.29 MIL/uL (ref 3.80–5.20)
RDW: 13.9 % (ref 11.5–14.5)
WBC: 10.6 10*3/uL (ref 3.6–11.0)

## 2015-12-11 LAB — COMPREHENSIVE METABOLIC PANEL
ALT: 15 U/L (ref 14–54)
AST: 19 U/L (ref 15–41)
Albumin: 3.8 g/dL (ref 3.5–5.0)
Alkaline Phosphatase: 65 U/L (ref 38–126)
Anion gap: 7 (ref 5–15)
BUN: 13 mg/dL (ref 6–20)
CO2: 24 mmol/L (ref 22–32)
Calcium: 9.1 mg/dL (ref 8.9–10.3)
Chloride: 106 mmol/L (ref 101–111)
Creatinine, Ser: 1.1 mg/dL — ABNORMAL HIGH (ref 0.44–1.00)
GFR calc Af Amer: >60 mL/min (ref >60)
GFR calc non Af Amer: 59 mL/min — ABNORMAL LOW (ref >60)
Glucose, Bld: 98 mg/dL (ref 65–99)
Potassium: 3.4 mmol/L — ABNORMAL LOW (ref 3.5–5.1)
Sodium: 137 mmol/L (ref 135–145)
Total Bilirubin: 0.4 mg/dL (ref 0.3–1.2)
Total Protein: 7.3 g/dL (ref 6.5–8.1)

## 2015-12-11 MED ORDER — TRAMADOL HCL 50 MG PO TABS: 50.0000 mg | ORAL_TABLET | Freq: Two times a day (BID) | ORAL | 0 refills | Status: DC | PRN

## 2015-12-11 NOTE — Progress Notes (Signed)
[facility] day:  12/11/15   Chief Complaint: Jill Walsh is a 45 y.o. 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  HPI: The patient was last seen in the medical oncology clinic on 06/05/017.  At that time, she felt good.  She was a little sore after lifting boxes.  Exam was unremarkable.  CBC with diff, CMP, and CA27.29 (20.6) were normal.  Left sided screening mammogram on 12/09/2015 revealed no evidence of malignancy.   During the interim, he continues to have discomfort and pain associated with the repetitive lifting and moving boxes at her work.  Shee denies any breast concerns. She denies any bone pain.   Past Medical History:  Diagnosis Date  . Breast cancer ([place]) 2016   RT MASTECTOMY    Past Surgical History:  Procedure Laterality Date  . BREAST BIOPSY Left    02/28/14 negative  . BREAST BIOPSY Right     02/2014 malignant  . MASTECTOMY Right 2016   BREAST CA  . TUBAL LIGATION      Family History  Problem Relation Age of Onset  . Cancer Mother   . Breast cancer Mother 65  . Stroke Brother   . Cancer Maternal Aunt     Social History:  reports that she has quit smoking. She quit after 15.00 years of use. She has quit using smokeless tobacco. She reports that she does not drink alcohol or use drugs.  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  The patient is accompanied by her fiance, [name], today.  Allergies: No Known Allergies  Current Medications: Current Outpatient Prescriptions  Medication Sig Dispense Refill  . acetaminophen (TYLENOL) 325 MG tablet Take 650 mg by mouth every 6 (six) hours as needed.    Marland Kitchen albuterol (PROVENTIL HFA;VENTOLIN HFA) 108 (90 Base) MCG/ACT inhaler Inhale 2 puffs into the lungs every 6 (six) hours as needed for wheezing or shortness of breath. 1 Inhaler 2   . benzonatate (TESSALON PERLES) 100 MG capsule Take 2 capsules (200 mg total) by mouth 3 (three) times daily. 40 capsule 0  . predniSONE (DELTASONE) 10 MG tablet Take 6 tablets  today, on day 2 take 5 tablets, day 3 take 4 tablets, day 4 take 3 tablets, day 5 take  2 tablets and 1 tablet the last day 21 tablet 0   No current facility-administered medications for this visit.     Review of Systems:  GENERAL:  Feels good.  No fevers or sweats.  Weight up 5 pounds. PERFORMANCE STATUS (ECOG):  0 HEENT:  No visual changes, runny nose, sore throat, mouth sores or tenderness. Lungs:  No shortness of breath or cough.  No hemoptysis.  Cardiac:  No chest pain, palpitations, orthopnea, or PND. Breasts:  No pain.  No skin changes or nipple discharge. GI:  No nausea, vomiting, diarrhea, constipation, melena or hematochezia. GU:  No urgency, frequency, dysuria, or hematuria. Musculoskeletal:  No back pain.  No joint pain.  No muscle tenderness. Extremities:  No pain or swelling. Skin:  No rashes or skin changes. Neuro:  No headache, numbness or weakness, balance or coordination issues. Endocrine:  No diabetes, thyroid issues, hot flashes or night sweats. Psych:  No mood changes, depression or anxiety. Pain:  No pain except with repetitive lifting at work.. Review of systems:  All other systems  reviewed and found to be negative.  Physical Exam: Blood pressure 130/88, pulse 98, temperature 98.1 F (36.7 C), temperature source Tympanic, resp. rate 18, weight 196 lb 3.4 oz (89 kg), last menstrual period 12/07/2015. GENERAL:  Well developed, well nourished, woman sitting comfortably in the exam room in no acute distress. MENTAL STATUS:  Alert and oriented to person, place and time. HEAD:   Long slightly wavy brown hair.  Normocephalic, atraumatic, face symmetric, no Cushingoid features. EYES:  Glasses.  Brown eyes with questionable early bilateral arcus.  Pupils equal round and reactive to light and  accomodation.  No conjunctivitis or scleral icterus. ENT:  Oropharynx clear without lesion.  Tongue normal. Mucous membranes moist.  RESPIRATORY:  Clear to auscultation without rales, wheezes or rhonchi. CARDIOVASCULAR:  Regular rate and rhythm without murmur, rub or gallop. BREAST:  Well healed right sided mastectomy without erythema, nodularity or skin changes.  Left breast with fibrocystic changes in the upper outer quadrant.  No masses, skin changes or nipple discharge.  ABDOMEN:  Soft, non-tender, with active bowel sounds, and no hepatosplenomegaly.  No masses. SKIN:  No rashes, ulcers or lesions. EXTREMITIES: No edema, no skin discoloration or tenderness.  No palpable cords. LYMPH NODES: No palpable cervical, supraclavicular, axillary or inguinal adenopathy  NEUROLOGICAL: Unremarkable. PSYCH:  Appropriate.    Appointment on 12/11/2015  Component Date Value Ref Range Status  . WBC 12/11/2015 10.6  3.6 - 11.0 K/uL Final  . RBC 12/11/2015 4.29  3.80 - 5.20 MIL/uL Final  . Hemoglobin 12/11/2015 12.0  12.0 - 16.0 g/dL Final  . HCT 12/11/2015 35.5  35.0 - 47.0 % Final  . MCV 12/11/2015 82.9  80.0 - 100.0 fL Final  . MCH 12/11/2015 28.0  26.0 - 34.0 pg Final  . MCHC 12/11/2015 33.8  32.0 - 36.0 g/dL Final  . RDW 12/11/2015 13.9  11.5 - 14.5 % Final  . Platelets 12/11/2015 287  150 - 440 K/uL Final  . Neutrophils Relative % 12/11/2015 68  % Final  . Neutro Abs 12/11/2015 7.3* 1.4 - 6.5 K/uL Final  . Lymphocytes Relative 12/11/2015 23  % Final  . Lymphs Abs 12/11/2015 2.5  1.0 - 3.6 K/uL Final  . Monocytes Relative 12/11/2015 6  % Final  . Monocytes Absolute 12/11/2015 0.7  0.2 - 0.9 K/uL Final  . Eosinophils Relative 12/11/2015 2  % Final  . Eosinophils Absolute 12/11/2015 0.2  0 - 0.7 K/uL Final  . Basophils Relative 12/11/2015 1  % Final  . Basophils Absolute 12/11/2015 0.1  0 - 0.1 K/uL Final  . Sodium 12/11/2015 137  135 - 145 mmol/L Final  . Potassium 12/11/2015 3.4* 3.5 - 5.1  mmol/L Final  . Chloride 12/11/2015 106  101 - 111 mmol/L Final  . CO2 12/11/2015 24  22 - 32 mmol/L Final  . Glucose, Bld 12/11/2015 98  65 - 99 mg/dL Final  . BUN 12/11/2015 13  6 - 20 mg/dL Final  . Creatinine, Ser 12/11/2015 1.10* 0.44 - 1.00 mg/dL Final  . Calcium 12/11/2015 9.1  8.9 - 10.3 mg/dL Final  . Total Protein 12/11/2015 7.3  6.5 - 8.1 g/dL Final  . Albumin 12/11/2015 3.8  3.5 - 5.0 g/dL Final  . AST 12/11/2015 19  15 - 41 U/L Final  . ALT 12/11/2015 15  14 - 54 U/L Final  . Alkaline Phosphatase 12/11/2015 65  38 - 126 U/L Final  . Total Bilirubin 12/11/2015 0.4  0.3 - 1.2 mg/dL Final  .  GFR calc non Af Amer 12/11/2015 59* >60 mL/min Final  . GFR calc Af Amer 12/11/2015 >60  >60 mL/min Final   Comment: (NOTE) The eGFR has been calculated using the CKD EPI equation. This calculation has not been validated in all clinical situations. eGFR's persistently <60 mL/min signify possible Chronic Kidney Disease.   . Anion gap 12/11/2015 7  5 - 15 Final    Assessment:  Jill Walsh is a 45 y.o. 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  No lymph nodes were removed.  Tumor was ER/PR negative and Her2/neu 3+.  Pathologic stage was T42mNx.    Left breast mammogram and ultrasound on 12/06/2014 revealed no mammographic or sonographic evidence of malignancy in the left breast.  There was an incidental note of two less than 4 mm benign appearing cysts at the 9:30 o'clock 9 cm and 10 cm from the nipple.  Left sided screening mammogram on 12/09/2015 revealed no evidence of malignancy.   CA27.29 was 24.4 (0-38.6) on 04/30/2014, 18.0 on 11/25/2014, 26.8 on 03/14/2015, 20.6 on 07/14/2015, and 19.1 on 12/11/2015.  MyRisk genetic testing revealed no clinically significant mutation.  She has a mother who developed breast cancer at age 46  A maternal aunt has colon cancer.    Symptomatically, she has chest wall discomfort with  repetitive lifting at work.  Exam is stable.    Plan: 1.  Labs today:   CBC with diff, CMP, CA27.29. 2.  Review left sided mammogram. 3.  Rx:  Trmadol 50 mg po q 12 hours prn pain; dis: #40. 4.  Letter for work regarding restrictions (weight lifted). 5.  RTC in 4 months for MD assessment and labs (CBC with diff, CMP, CA27.29).   MLequita Asal MD  12/11/2015, 4:20 PM

## 2015-12-11 NOTE — Progress Notes (Signed)
Patient states she has pain in her right breast even though her cancer was on the left side.  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  Would like mammogram results today.

## 2015-12-12 LAB — CANCER ANTIGEN 27.29: CA 27.29: 19.1 U/mL (ref 0.0–38.6)

## 2015-12-13 ENCOUNTER — Encounter: Payer: Self-pay | Admitting: Hematology and Oncology

## 2015-12-15 ENCOUNTER — Other Ambulatory Visit: Payer: Self-pay | Admitting: *Deleted

## 2015-12-15 DIAGNOSIS — C50911 Malignant neoplasm of unspecified site of right female breast: Secondary | ICD-10-CM

## 2015-12-15 DIAGNOSIS — Z171 Estrogen receptor negative status [ER-]: Principal | ICD-10-CM

## 2016-04-08 ENCOUNTER — Other Ambulatory Visit: Payer: 59

## 2016-04-08 ENCOUNTER — Ambulatory Visit: Payer: 59 | Admitting: Hematology and Oncology

## 2016-04-15 ENCOUNTER — Inpatient Hospital Stay (HOSPITAL_BASED_OUTPATIENT_CLINIC_OR_DEPARTMENT_OTHER): Payer: Commercial Managed Care - HMO | Admitting: Hematology and Oncology

## 2016-04-15 ENCOUNTER — Encounter: Payer: Self-pay | Admitting: Hematology and Oncology

## 2016-04-15 ENCOUNTER — Inpatient Hospital Stay: Payer: Commercial Managed Care - HMO | Attending: Hematology and Oncology

## 2016-04-15 VITALS — BP 123/77 | HR 90 | Temp 97.4°F | Resp 18 | Wt 198.9 lb

## 2016-04-15 DIAGNOSIS — Z803 Family history of malignant neoplasm of breast: Secondary | ICD-10-CM | POA: Diagnosis not present

## 2016-04-15 DIAGNOSIS — C50911 Malignant neoplasm of unspecified site of right female breast: Secondary | ICD-10-CM | POA: Insufficient documentation

## 2016-04-15 DIAGNOSIS — Z9011 Acquired absence of right breast and nipple: Secondary | ICD-10-CM | POA: Insufficient documentation

## 2016-04-15 DIAGNOSIS — Z87891 Personal history of nicotine dependence: Secondary | ICD-10-CM | POA: Insufficient documentation

## 2016-04-15 DIAGNOSIS — Z171 Estrogen receptor negative status [ER-]: Secondary | ICD-10-CM

## 2016-04-15 LAB — COMPREHENSIVE METABOLIC PANEL
ALT: 21 U/L (ref 14–54)
AST: 21 U/L (ref 15–41)
Albumin: 4.1 g/dL (ref 3.5–5.0)
Alkaline Phosphatase: 81 U/L (ref 38–126)
Anion gap: 8 (ref 5–15)
BUN: 11 mg/dL (ref 6–20)
CO2: 26 mmol/L (ref 22–32)
Calcium: 9.4 mg/dL (ref 8.9–10.3)
Chloride: 101 mmol/L (ref 101–111)
Creatinine, Ser: 0.84 mg/dL (ref 0.44–1.00)
GFR calc Af Amer: >60 mL/min (ref >60)
GFR calc non Af Amer: >60 mL/min (ref >60)
Glucose, Bld: 89 mg/dL (ref 65–99)
Potassium: 3.9 mmol/L (ref 3.5–5.1)
Sodium: 135 mmol/L (ref 135–145)
Total Bilirubin: 0.5 mg/dL (ref 0.3–1.2)
Total Protein: 7.8 g/dL (ref 6.5–8.1)

## 2016-04-15 LAB — CBC WITH DIFFERENTIAL/PLATELET
Basophils Absolute: 0 10*3/uL (ref 0–0.1)
Basophils Relative: 1 %
Eosinophils Absolute: 0.2 10*3/uL (ref 0–0.7)
Eosinophils Relative: 2 %
HCT: 36.6 % (ref 35.0–47.0)
Hemoglobin: 12.5 g/dL (ref 12.0–16.0)
Lymphocytes Relative: 26 %
Lymphs Abs: 2.4 10*3/uL (ref 1.0–3.6)
MCH: 28.2 pg (ref 26.0–34.0)
MCHC: 34.1 g/dL (ref 32.0–36.0)
MCV: 82.9 fL (ref 80.0–100.0)
Monocytes Absolute: 0.7 10*3/uL (ref 0.2–0.9)
Monocytes Relative: 8 %
Neutro Abs: 5.7 10*3/uL (ref 1.4–6.5)
Neutrophils Relative %: 63 %
Platelets: 301 10*3/uL (ref 150–440)
RBC: 4.42 MIL/uL (ref 3.80–5.20)
RDW: 14.3 % (ref 11.5–14.5)
WBC: 9 10*3/uL (ref 3.6–11.0)

## 2016-04-15 NOTE — Progress Notes (Signed)
Patient offers no complaints today. 

## 2016-04-15 NOTE — Progress Notes (Signed)
[facility] day:  04/15/16   Chief Complaint: Jill Walsh is a 46 y.o. 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  HPI: The patient was last seen in the medical oncology clinic on 11/02/017.  At that time, she had chest wall discomfort with repetitive lifting at work.  Exam was stable. Labs were normal.  She was given a prescription for Tramadol.  A letter was written limiting her lifting.  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  She started 12/29/2015.  She is doing well.  She denies any pain.  Her work does not require lifting.  The company is very supportive.  She denies any breast concerns.   Past Medical History:  Diagnosis Date  . Breast cancer ([place]) 2016   RT MASTECTOMY    Past Surgical History:  Procedure Laterality Date  . BREAST BIOPSY Left    02/28/14 negative  . BREAST BIOPSY Right     02/2014 malignant  . MASTECTOMY Right 2016   BREAST CA  . TUBAL LIGATION      Family History  Problem Relation Age of Onset  . Cancer Mother   . Breast cancer Mother 70  . Stroke Brother   . Cancer Maternal Aunt     Social History:  reports that she has quit smoking. She quit after 15.00 years of use. She has quit using smokeless tobacco. She reports that she does not drink alcohol or use drugs.  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  The patient is accompanied by her fiance, [name], today.  They are building a house together.  Allergies: No Known Allergies  Current Medications: Current Outpatient Prescriptions  Medication Sig Dispense Refill  . acetaminophen (TYLENOL) 325 MG tablet Take 650 mg by mouth every 6 (six) hours as needed.    Marland Kitchen albuterol (PROVENTIL HFA;VENTOLIN HFA) 108 (90 Base) MCG/ACT inhaler Inhale 2 puffs into the lungs every 6 (six)  hours as needed for wheezing or shortness of breath. 1 Inhaler 2  . benzonatate (TESSALON PERLES) 100 MG capsule Take 2 capsules (200 mg total) by mouth 3 (three) times daily. 40 capsule 0  . predniSONE (DELTASONE) 10 MG tablet Take 6 tablets  today, on day 2 take 5 tablets, day 3 take 4 tablets, day 4 take 3 tablets, day 5 take  2 tablets and 1 tablet the last day 21 tablet 0  . traMADol (ULTRAM) 50 MG tablet Take 1 tablet (50 mg total) by mouth 2 (two) times daily as needed. 40 tablet 0   No current facility-administered medications for this visit.     Review of Systems:  GENERAL:  Feels good.  No fevers or sweats.  Weight up 2 pounds. PERFORMANCE STATUS (ECOG):  0 HEENT:  No visual changes, runny nose, sore throat, mouth sores or tenderness. Lungs:  No shortness of breath or cough.  No hemoptysis.  Cardiac:  No chest pain, palpitations, orthopnea, or PND. Breasts:  No pain.  No skin changes or nipple discharge. GI:  No nausea, vomiting, diarrhea, constipation, melena or hematochezia. GU:  No urgency, frequency, dysuria, or hematuria. Musculoskeletal:  No back pain.  No joint pain.  No muscle tenderness. Extremities:  No pain or swelling. Skin:  No rashes or skin changes. Neuro:  No headache, numbness or  weakness, balance or coordination issues. Endocrine:  No diabetes, thyroid issues, hot flashes or night sweats. Psych:  No mood changes, depression or anxiety. Pain:  No pain. Review of systems:  All other systems reviewed and found to be negative.  Physical Exam: Blood pressure 123/77, pulse 90, temperature 97.4 F (36.3 C), temperature source Tympanic, resp. rate 18, weight 198 lb 13.7 oz (90.2 kg). GENERAL:  Well developed, well nourished, woman sitting comfortably in the exam room in no acute distress.  She is smiling. MENTAL STATUS:  Alert and oriented to person, place and time. HEAD:   Long slightly wavy brown hair.  Normocephalic, atraumatic, face symmetric, no Cushingoid  features. EYES:  Glasses.  Brown eyes with questionable early bilateral arcus.  Pupils equal round and reactive to light and accomodation.  No conjunctivitis or scleral icterus. ENT:  Oropharynx clear without lesion.  Tongue normal. Mucous membranes moist.  RESPIRATORY:  Clear to auscultation without rales, wheezes or rhonchi. CARDIOVASCULAR:  Regular rate and rhythm without murmur, rub or gallop. BREAST:  Well healed right sided mastectomy without erythema, nodularity or skin changes.  Left breast with fibrocystic changes in the upper outer quadrant (stable).  No masses, skin changes or nipple discharge.  ABDOMEN:  Soft, non-tender, with active bowel sounds, and no hepatosplenomegaly.  No masses. SKIN:  No rashes, ulcers or lesions. EXTREMITIES: No edema, no skin discoloration or tenderness.  No palpable cords. LYMPH NODES: No palpable cervical, supraclavicular, axillary or inguinal adenopathy  NEUROLOGICAL: Unremarkable. PSYCH:  Appropriate.   Happy.   Appointment on 04/15/2016  Component Date Value Ref Range Status  . WBC 04/15/2016 9.0  3.6 - 11.0 K/uL Final  . RBC 04/15/2016 4.42  3.80 - 5.20 MIL/uL Final  . Hemoglobin 04/15/2016 12.5  12.0 - 16.0 g/dL Final  . HCT 04/15/2016 36.6  35.0 - 47.0 % Final  . MCV 04/15/2016 82.9  80.0 - 100.0 fL Final  . MCH 04/15/2016 28.2  26.0 - 34.0 pg Final  . MCHC 04/15/2016 34.1  32.0 - 36.0 g/dL Final  . RDW 04/15/2016 14.3  11.5 - 14.5 % Final  . Platelets 04/15/2016 301  150 - 440 K/uL Final  . Neutrophils Relative % 04/15/2016 63  % Final  . Neutro Abs 04/15/2016 5.7  1.4 - 6.5 K/uL Final  . Lymphocytes Relative 04/15/2016 26  % Final  . Lymphs Abs 04/15/2016 2.4  1.0 - 3.6 K/uL Final  . Monocytes Relative 04/15/2016 8  % Final  . Monocytes Absolute 04/15/2016 0.7  0.2 - 0.9 K/uL Final  . Eosinophils Relative 04/15/2016 2  % Final  . Eosinophils Absolute 04/15/2016 0.2  0 - 0.7 K/uL Final  . Basophils Relative 04/15/2016 1  % Final  .  Basophils Absolute 04/15/2016 0.0  0 - 0.1 K/uL Final  . Sodium 04/15/2016 135  135 - 145 mmol/L Final  . Potassium 04/15/2016 3.9  3.5 - 5.1 mmol/L Final  . Chloride 04/15/2016 101  101 - 111 mmol/L Final  . CO2 04/15/2016 26  22 - 32 mmol/L Final  . Glucose, Bld 04/15/2016 89  65 - 99 mg/dL Final  . BUN 04/15/2016 11  6 - 20 mg/dL Final  . Creatinine, Ser 04/15/2016 0.84  0.44 - 1.00 mg/dL Final  . Calcium 04/15/2016 9.4  8.9 - 10.3 mg/dL Final  . Total Protein 04/15/2016 7.8  6.5 - 8.1 g/dL Final  . Albumin 04/15/2016 4.1  3.5 - 5.0 g/dL Final  . AST 04/15/2016 21  15 - 41 U/L Final  . ALT 04/15/2016 21  14 - 54 U/L Final  . Alkaline Phosphatase 04/15/2016 81  38 - 126 U/L Final  . Total Bilirubin 04/15/2016 0.5  0.3 - 1.2 mg/dL Final  . GFR calc non Af Amer 04/15/2016 >60  >60 mL/min Final  . GFR calc Af Amer 04/15/2016 >60  >60 mL/min Final   Comment: (NOTE) The eGFR has been calculated using the CKD EPI equation. This calculation has not been validated in all clinical situations. eGFR's persistently <60 mL/min signify possible Chronic Kidney Disease.   . Anion gap 04/15/2016 8  5 - 15 Final    Assessment:  Jill Walsh is a 46 y.o. 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  No lymph nodes were removed.  Tumor was ER/PR negative and Her2/neu 3+.  Pathologic stage was T83mNx.    Left breast mammogram and ultrasound on 12/06/2014 revealed no mammographic or sonographic evidence of malignancy in the left breast.  There was an incidental note of two less than 4 mm benign appearing cysts at the 9:30 o'clock 9 cm and 10 cm from the nipple.  Left sided screening mammogram on 12/09/2015 revealed no evidence of malignancy.   CA27.29 has been followed: 24.4 on 04/30/2014, 18.0 on 11/25/2014, 26.8 on 03/14/2015, 20.6 on 07/14/2015, 19.1 on 12/11/2015, and 21.6 on 04/15/2016.  MyRisk genetic testing revealed no clinically  significant mutation.  She has a mother who developed breast cancer at age 47  A maternal aunt has colon cancer.    Symptomatically, she is doing well.  Exam is stable.    Plan: 1.  Labs today: CBC with diff, CMP, CA27.29. 2.  RTC in 6 months for MD assessment and labs (CBC with diff, CMP, CA27.29).   MLequita Asal MD  04/15/2016, 11:57 AM

## 2016-04-16 LAB — CANCER ANTIGEN 27.29: CA 27.29: 21.6 U/mL (ref 0.0–38.6)

## 2016-06-28 ENCOUNTER — Emergency Department: Payer: Commercial Managed Care - HMO

## 2016-06-28 ENCOUNTER — Encounter: Payer: Self-pay | Admitting: Emergency Medicine

## 2016-06-28 ENCOUNTER — Emergency Department
Admission: EM | Admit: 2016-06-28 | Discharge: 2016-06-28 | Disposition: A | Discharge status: Home / Self Care | Payer: Commercial Managed Care - HMO | Attending: Emergency Medicine | Admitting: Emergency Medicine

## 2016-06-28 DIAGNOSIS — Z87891 Personal history of nicotine dependence: Secondary | ICD-10-CM | POA: Diagnosis not present

## 2016-06-28 DIAGNOSIS — Y939 Activity, unspecified: Secondary | ICD-10-CM | POA: Diagnosis not present

## 2016-06-28 DIAGNOSIS — X500XXA Overexertion from strenuous movement or load, initial encounter: Secondary | ICD-10-CM | POA: Diagnosis not present

## 2016-06-28 DIAGNOSIS — S29011A Strain of muscle and tendon of front wall of thorax, initial encounter: Secondary | ICD-10-CM | POA: Diagnosis not present

## 2016-06-28 DIAGNOSIS — Y999 Unspecified external cause status: Secondary | ICD-10-CM | POA: Diagnosis not present

## 2016-06-28 DIAGNOSIS — Y929 Unspecified place or not applicable: Secondary | ICD-10-CM | POA: Diagnosis not present

## 2016-06-28 DIAGNOSIS — R0789 Other chest pain: Secondary | ICD-10-CM | POA: Diagnosis present

## 2016-06-28 MED ORDER — KETOROLAC TROMETHAMINE 30 MG/ML IJ SOLN
30.0000 mg | Freq: Once | INTRAMUSCULAR | Status: AC
  Administered 2016-06-28: 30 mg via INTRAMUSCULAR
  Filled 2016-06-28: qty 1

## 2016-06-28 MED ORDER — LORAZEPAM 1 MG PO TABS
1.0000 mg | ORAL_TABLET | Freq: Once | ORAL | Status: AC
  Administered 2016-06-28: 1 mg via ORAL
  Filled 2016-06-28: qty 1

## 2016-06-28 MED ORDER — CYCLOBENZAPRINE HCL 5 MG PO TABS: 5.0000 mg | ORAL_TABLET | Freq: Three times a day (TID) | ORAL | 0 refills | Status: DC | PRN

## 2016-06-28 MED ORDER — KETOROLAC TROMETHAMINE 10 MG PO TABS: 10.0000 mg | ORAL_TABLET | Freq: Three times a day (TID) | ORAL | 0 refills | Status: AC | PRN

## 2016-06-28 NOTE — ED Notes (Signed)
See triage note states she developed pain to right shoulder and upper chest on Friday  States pain started after lifting something   Increased pain with movement

## 2016-06-28 NOTE — ED Provider Notes (Signed)
[facility] Emergency Department Provider Note   ____________________________________________   I have reviewed the triage vital signs and the nursing notes.   HISTORY  Chief Complaint Pleurisy    HPI Jill Walsh is a 46 y.o. female presents with right upper chest wall pain approximately the pectoralis region. Patient reports lifting a box of candy 3 days ago when she had sudden onset of pain and reports pain since the lifting injury. Patient has a history of breast cancer with right total mastectomy in February 2016. Patient denies pain, numbness or tingling in the right upper extremity. Patient denies fever, chills, headache, vision changes, chest pain, chest tightness, shortness of breath, abdominal pain, nausea and vomiting.  Past Medical History:  Diagnosis Date  . Breast cancer ([place]) 2016   RT MASTECTOMY    Patient Active Problem List   Diagnosis Date Noted  . Breast pain 12/20/2014  . Family history of breast cancer 05/28/2014  . Current smoker 05/28/2014  . Breast cancer ([place]) 05/28/2014  . Breast cancer, right breast ([place]) 03/28/2014    Past Surgical History:  Procedure Laterality Date  . BREAST BIOPSY Left    02/28/14 negative  . BREAST BIOPSY Right     02/2014 malignant  . MASTECTOMY Right 2016   BREAST CA  . TUBAL LIGATION      Prior to Admission medications   Medication Sig Start Date End Date Taking? Authorizing Provider  acetaminophen (TYLENOL) 325 MG tablet Take 650 mg by mouth every 6 (six) hours as needed.    [provider]  albuterol (PROVENTIL HFA;VENTOLIN HFA) 108 (90 Base) MCG/ACT inhaler Inhale 2 puffs into the lungs every 6 (six) hours as needed for wheezing or shortness of breath. 09/23/15   Johnn Hai, PA-C  benzonatate (TESSALON PERLES) 100 MG capsule Take 2 capsules (200 mg total) by mouth 3 (three) times daily. 09/23/15 09/22/16  Johnn Hai, PA-C  cyclobenzaprine (FLEXERIL) 5 MG tablet Take  1 tablet (5 mg total) by mouth 3 (three) times daily as needed for muscle spasms. 06/28/16   Dallie Patton M, PA-C  ketorolac (TORADOL) 10 MG tablet Take 1 tablet (10 mg total) by mouth every 8 (eight) hours as needed. 06/28/16 07/03/16  Banita Lehn M, PA-C  predniSONE (DELTASONE) 10 MG tablet Take 6 tablets  today, on day 2 take 5 tablets, day 3 take 4 tablets, day 4 take 3 tablets, day 5 take  2 tablets and 1 tablet the last day 09/23/15   Johnn Hai, PA-C  traMADol (ULTRAM) 50 MG tablet Take 1 tablet (50 mg total) by mouth 2 (two) times daily as needed. 12/11/15   Lequita Asal, MD    Allergies Patient has no known allergies.  Family History  Problem Relation Age of Onset  . Cancer Mother   . Breast cancer Mother 54  . Stroke Brother   . Cancer Maternal Aunt     Social History Social History  Substance Use Topics  . Smoking status: Former Smoker    Years: 15.00  . Smokeless tobacco: Former Systems developer  . Alcohol use No    Review of Systems Constitutional:  Negative for fever/chills Eyes: No visual changes. Cardiovascular: Denies chest pain. Respiratory: Denies cough Denies shortness of breath. Gastrointestinal: No abdominal pain.  No nausea, vomiting, diarrhea. Musculoskeletal: Right pectoralis, anterior shoulder pain secondary to lifting injury Skin: Negative for rash. Neurological:  Negative focal weakness or numbness. ____________________________________________   PHYSICAL EXAM:  VITAL SIGNS: ED  Triage Vitals [06/28/16 1341]  Enc Vitals Group     BP 124/84     Pulse Rate 93     Resp 18     Temp 98.4 F (36.9 C)     Temp Source Oral     SpO2 98 %     Weight 198 lb (89.8 kg)     Height 5\' 5"  (1.651 m)     Head Circumference      Peak Flow      Pain Score 9     Pain Loc      Pain Edu?      Excl. in [place]?     Constitutional: Alert and oriented. Well appearing and in no acute distress.  Head: Normocephalic and atraumatic. Eyes: Conjunctivae are normal.  PERRL.  Cardiovascular: Normal rate, regular rhythm. Normal distal pulses. Respiratory: Normal respiratory effort.  Gastrointestinal: Soft and nontender.  Musculoskeletal: Right upper extremity full range of motion however causes pain at pectoralis with forward flexion. Tenderness to palpation along pectoralis muscle belly. Pectoralis strength test intact however painful. Neurologic: Normal speech and language. No gross focal neurologic deficits are appreciated. Skin:  Skin is warm, dry and intact. No rash noted. Psychiatric: Mood and affect are normal. ____________________________________________   LABS (all labs ordered are listed, but only abnormal results are displayed)  Labs Reviewed - No data to display ____________________________________________  EKG None ____________________________________________  RADIOLOGY DG Chest FINDINGS: Normal heart size and mediastinal contours. No acute infiltrate or edema. No effusion or pneumothorax. Spondylosis. No acute osseous findings.  IMPRESSION: Negative chest. ____________________________________________   PROCEDURES  Procedure(s) performed: No    Critical Care performed: no ____________________________________________   INITIAL IMPRESSION / ASSESSMENT AND PLAN / ED COURSE  Pertinent labs & imaging results that were available during my care of the patient were reviewed by me and considered in my medical decision making (see chart for details).  Patient's symptoms consistent with right pectoralis muscle strain secondary to lifting injury. Patient noted improvement of symptoms with Toradol and Ativan given during course of care in the emergency department today. Physical exam findings and imaging were reassuring for no acute fracture or neurovascular changes. Patient will be prescribed Toradol for 5 days and Flexeril as needed. Recommend she follow-up with orthopedics. In addition, I recommend she follow up with oncology  considering injury occurred on the same side as her breast cancer and mastectomy. Patient informed of clinical course, understand medical decision-making process, and agree with plan.  Patient was advised to follow up with Orthopedics and was also advised to return to the emergency department for symptoms that change or worsen if unable to schedule an appointment.     ______________________   FINAL CLINICAL IMPRESSION(S) / ED DIAGNOSES  Final diagnoses:  Pectoralis muscle strain, initial encounter       NEW MEDICATIONS STARTED DURING THIS VISIT:  Discharge Medication List as of 06/28/2016  4:12 PM    START taking these medications   Details  cyclobenzaprine (FLEXERIL) 5 MG tablet Take 1 tablet (5 mg total) by mouth 3 (three) times daily as needed for muscle spasms., Starting Mon 06/28/2016, Print    ketorolac (TORADOL) 10 MG tablet Take 1 tablet (10 mg total) by mouth every 8 (eight) hours as needed., Starting Mon 06/28/2016, Until Sat 07/03/2016, Print         Note:  This document was prepared using Dragon voice recognition software and may include unintentional dictation errors.   Jerolyn Shin, [place] 06/28/16 1918  Schuyler Amor, MD 06/29/16 [phone]

## 2016-06-28 NOTE — Discharge Instructions (Signed)
Take medication as prescribed. Return to emergency department if symptoms worsen and follow-up with PCP as needed.   °

## 2016-06-28 NOTE — ED Triage Notes (Signed)
Was lifting about 10pm 3 days ago, sudden pain R chest wall. Patient has history of breast cancer with R mastectomy. No visible redness at site of pain, however light palpation does increase pain and pain is improved with some positions.

## 2016-08-10 NOTE — Telephone Encounter (Signed)
done

## 2016-09-01 ENCOUNTER — Telehealth: Payer: Self-pay | Admitting: *Deleted

## 2016-09-01 ENCOUNTER — Encounter: Payer: Self-pay | Admitting: *Deleted

## 2016-09-01 NOTE — Telephone Encounter (Signed)
Called and left patient a message that the letter for work would be ready for pick up in [place] tomorrow.

## 2016-10-17 NOTE — Progress Notes (Signed)
[facility] day:  10/18/16   Chief Complaint: Jill Walsh is a 46 y.o. 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  HPI: The patient was last seen in the medical oncology clinic on 04/15/2016.  At that time, she was doing well.  She had a new job at [employer] which did not require heavy lifting.  She denied any breast concerns.  Exam was stable.  Labs were normal.  During the interm, she has been fatigued. She is doing a lot of lifting at work.  She has been lifting 15-20 pounds of candy at a time.  She is experiencing some soreness/tenderness to the right side of her chest following her mastectomy. She denies nausea, vomiting, weight loss, or recent infections.    Past Medical History:  Diagnosis Date  . Breast cancer ([place]) 2016   RT MASTECTOMY    Past Surgical History:  Procedure Laterality Date  . BREAST BIOPSY Left    02/28/14 negative  . BREAST BIOPSY Right     02/2014 malignant  . MASTECTOMY Right 2016   BREAST CA  . TUBAL LIGATION      Family History  Problem Relation Age of Onset  . Cancer Mother   . Breast cancer Mother 68  . Stroke Brother   . Cancer Maternal Aunt     Social History:  reports that she has quit smoking. She quit after 15.00 years of use. She has quit using smokeless tobacco. She reports that she does not drink alcohol or use drugs.  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  The patient is accompanied by her fiance, [name], today.  They are building a house together.  Allergies: No Known Allergies  Current Medications: Current Outpatient Prescriptions  Medication Sig Dispense Refill  . acetaminophen (TYLENOL) 325 MG tablet Take 650 mg by mouth every 6 (six) hours as needed.    Marland Kitchen albuterol (PROVENTIL HFA;VENTOLIN HFA) 108 (90 Base) MCG/ACT inhaler Inhale 2 puffs into  the lungs every 6 (six) hours as needed for wheezing or shortness of breath. 1 Inhaler 2  . cyclobenzaprine (FLEXERIL) 5 MG tablet Take 1 tablet (5 mg total) by mouth 3 (three) times daily as needed for muscle spasms. 20 tablet 0  . traMADol (ULTRAM) 50 MG tablet Take 1 tablet (50 mg total) by mouth 2 (two) times daily as needed. 40 tablet 0  . predniSONE (DELTASONE) 10 MG tablet Take 6 tablets  today, on day 2 take 5 tablets, day 3 take 4 tablets, day 4 take 3 tablets, day 5 take  2 tablets and 1 tablet the last day (Patient not taking: Reported on 10/18/2016) 21 tablet 0   No current facility-administered medications for this visit.     Review of Systems:  GENERAL:  Feels fatigued.  No fevers or sweats.  Weight up 1 pound. PERFORMANCE STATUS (ECOG):  0 HEENT:  No visual changes, runny nose, sore throat, mouth sores or tenderness. Lungs:  No shortness of breath or cough.  No hemoptysis.  Cardiac:  No chest pain, palpitations, orthopnea, or PND. Breasts:  No pain.  No skin changes or nipple discharge. GI:  No nausea, vomiting, diarrhea, constipation, melena or hematochezia. GU:  No urgency, frequency, dysuria, or hematuria. Musculoskeletal:  No back pain.  No joint pain.  No muscle tenderness. Extremities:  No pain  or swelling. Skin:  No rashes or skin changes. Neuro:  No headache, numbness or weakness, balance or coordination issues. Endocrine:  No diabetes, thyroid issues, hot flashes or night sweats. Psych:  No mood changes, depression or anxiety. Pain:  No pain. Review of systems:  All other systems reviewed and found to be negative.  Physical Exam: Blood pressure 127/79, pulse 87, temperature (!) 97.1 F (36.2 C), temperature source Tympanic, resp. rate 18, weight 199 lb 8 oz (90.5 kg). GENERAL:  Well developed, well nourished, woman sitting comfortably in the exam room in no acute distress.  She is smiling. MENTAL STATUS:  Alert and oriented to person, place and time. HEAD:   Long  slightly wavy brown hair.  Normocephalic, atraumatic, face symmetric, no Cushingoid features. EYES:  Glasses.  Brown eyes with questionable early bilateral arcus.  Pupils equal round and reactive to light and accomodation.  No conjunctivitis or scleral icterus. ENT:  Oropharynx clear without lesion.  Tongue normal. Mucous membranes moist.  RESPIRATORY:  Clear to auscultation without rales, wheezes or rhonchi. CARDIOVASCULAR:  Regular rate and rhythm without murmur, rub or gallop. BREAST:  Right sided mastectomy without erythema, nodularity or skin changes. Tender to palpation.  Left breast with fibrocystic changes in the upper outer quadrant (stable).  No masses, skin changes or nipple discharge.  ABDOMEN:  Soft, non-tender, with active bowel sounds, and no hepatosplenomegaly.  No masses. SKIN:  No rashes, ulcers or lesions. EXTREMITIES: No edema, no skin discoloration or tenderness.  No palpable cords. LYMPH NODES: No palpable cervical, supraclavicular, axillary or inguinal adenopathy  NEUROLOGICAL: Unremarkable. PSYCH:  Appropriate.    Appointment on 10/18/2016  Component Date Value Ref Range Status  . WBC 10/18/2016 10.1  3.6 - 11.0 K/uL Final  . RBC 10/18/2016 4.48  3.80 - 5.20 MIL/uL Final  . Hemoglobin 10/18/2016 12.6  12.0 - 16.0 g/dL Final  . HCT 10/18/2016 36.9  35.0 - 47.0 % Final  . MCV 10/18/2016 82.4  80.0 - 100.0 fL Final  . MCH 10/18/2016 28.2  26.0 - 34.0 pg Final  . MCHC 10/18/2016 34.3  32.0 - 36.0 g/dL Final  . RDW 10/18/2016 15.0* 11.5 - 14.5 % Final  . Platelets 10/18/2016 288  150 - 440 K/uL Final  . Neutrophils Relative % 10/18/2016 70  % Final  . Neutro Abs 10/18/2016 7.0* 1.4 - 6.5 K/uL Final  . Lymphocytes Relative 10/18/2016 22  % Final  . Lymphs Abs 10/18/2016 2.3  1.0 - 3.6 K/uL Final  . Monocytes Relative 10/18/2016 7  % Final  . Monocytes Absolute 10/18/2016 0.7  0.2 - 0.9 K/uL Final  . Eosinophils Relative 10/18/2016 1  % Final  . Eosinophils Absolute  10/18/2016 0.1  0 - 0.7 K/uL Final  . Basophils Relative 10/18/2016 0  % Final  . Basophils Absolute 10/18/2016 0.0  0 - 0.1 K/uL Final  . Sodium 10/18/2016 135  135 - 145 mmol/L Final  . Potassium 10/18/2016 4.0  3.5 - 5.1 mmol/L Final  . Chloride 10/18/2016 101  101 - 111 mmol/L Final  . CO2 10/18/2016 24  22 - 32 mmol/L Final  . Glucose, Bld 10/18/2016 94  65 - 99 mg/dL Final  . BUN 10/18/2016 14  6 - 20 mg/dL Final  . Creatinine, Ser 10/18/2016 0.92  0.44 - 1.00 mg/dL Final  . Calcium 10/18/2016 9.6  8.9 - 10.3 mg/dL Final  . Total Protein 10/18/2016 7.7  6.5 - 8.1 g/dL Final  . Albumin 10/18/2016  4.0  3.5 - 5.0 g/dL Final  . AST 10/18/2016 27  15 - 41 U/L Final  . ALT 10/18/2016 23  14 - 54 U/L Final  . Alkaline Phosphatase 10/18/2016 68  38 - 126 U/L Final  . Total Bilirubin 10/18/2016 0.5  0.3 - 1.2 mg/dL Final  . GFR calc non Af Amer 10/18/2016 >60  >60 mL/min Final  . GFR calc Af Amer 10/18/2016 >60  >60 mL/min Final   Comment: (NOTE) The eGFR has been calculated using the CKD EPI equation. This calculation has not been validated in all clinical situations. eGFR's persistently <60 mL/min signify possible Chronic Kidney Disease.   . Anion gap 10/18/2016 10  5 - 15 Final    Assessment:  Jill Walsh is a 46 y.o. 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  No lymph nodes were removed.  Tumor was ER/PR negative and Her2/neu 3+.  Pathologic stage was [id number].    Left breast mammogram and ultrasound on 12/06/2014 revealed no mammographic or sonographic evidence of malignancy in the left breast.  There was an incidental note of two less than 4 mm benign appearing cysts at the 9:30 o'clock 9 cm and 10 cm from the nipple.  Left sided screening mammogram on 12/08/2015 revealed no evidence of malignancy.   CA27.29 has been followed: 24.4 on 04/30/2014, 18.0 on 11/25/2014, 26.8 on 03/14/2015, 20.6 on 07/14/2015, 19.1 on  12/11/2015, 21.6 on 04/15/2016, and 18.6 on 10/18/2016.  MyRisk genetic testing revealed no clinically significant mutation.  She has a mother who developed breast cancer at age 47  A maternal aunt has colon cancer.    Symptomatically, she has job related fatigue. She is sore and tender over her right mastectomy site.  Labs are unremarkable.   Plan: 1.  Labs today: CBC with diff, CMP, CA27.29. 2.  Schedule left sided screening mammogram on 12/07/2016. 3.  Complete FMLA forms. 4.  Letter for work. 5.  RTC in 6 months for MD assessment and labs (CBC with diff, CMP, CA27.29).   BHonor Loh NP  10/18/2016, 11:20 AM   I saw and evaluated the patient, participating in the key portions of the service and reviewing pertinent diagnostic studies and records.  I reviewed the nurse practitioner's note and agree with the findings and the plan.  The assessment and plan were discussed with the patient.  A few questions were asked by the patient and answered.   MLequita Asal MD 10/18/2016, 3:33 PM

## 2016-10-18 ENCOUNTER — Inpatient Hospital Stay (HOSPITAL_BASED_OUTPATIENT_CLINIC_OR_DEPARTMENT_OTHER): Payer: 59 | Admitting: Hematology and Oncology

## 2016-10-18 ENCOUNTER — Encounter: Payer: Self-pay | Admitting: Hematology and Oncology

## 2016-10-18 ENCOUNTER — Inpatient Hospital Stay: Payer: 59 | Attending: Hematology and Oncology

## 2016-10-18 ENCOUNTER — Other Ambulatory Visit: Payer: Self-pay | Admitting: *Deleted

## 2016-10-18 VITALS — BP 127/79 | HR 87 | Temp 97.1°F | Resp 18 | Wt 199.5 lb

## 2016-10-18 DIAGNOSIS — Z87891 Personal history of nicotine dependence: Secondary | ICD-10-CM

## 2016-10-18 DIAGNOSIS — Z79899 Other long term (current) drug therapy: Secondary | ICD-10-CM

## 2016-10-18 DIAGNOSIS — Z171 Estrogen receptor negative status [ER-]: Secondary | ICD-10-CM | POA: Insufficient documentation

## 2016-10-18 DIAGNOSIS — Z9011 Acquired absence of right breast and nipple: Secondary | ICD-10-CM | POA: Diagnosis not present

## 2016-10-18 DIAGNOSIS — R5383 Other fatigue: Secondary | ICD-10-CM | POA: Diagnosis not present

## 2016-10-18 DIAGNOSIS — C50911 Malignant neoplasm of unspecified site of right female breast: Secondary | ICD-10-CM | POA: Diagnosis not present

## 2016-10-18 DIAGNOSIS — Z853 Personal history of malignant neoplasm of breast: Secondary | ICD-10-CM

## 2016-10-18 LAB — CBC WITH DIFFERENTIAL/PLATELET
Basophils Absolute: 0 10*3/uL (ref 0–0.1)
Basophils Relative: 0 %
Eosinophils Absolute: 0.1 10*3/uL (ref 0–0.7)
Eosinophils Relative: 1 %
HCT: 36.9 % (ref 35.0–47.0)
Hemoglobin: 12.6 g/dL (ref 12.0–16.0)
Lymphocytes Relative: 22 %
Lymphs Abs: 2.3 10*3/uL (ref 1.0–3.6)
MCH: 28.2 pg (ref 26.0–34.0)
MCHC: 34.3 g/dL (ref 32.0–36.0)
MCV: 82.4 fL (ref 80.0–100.0)
Monocytes Absolute: 0.7 10*3/uL (ref 0.2–0.9)
Monocytes Relative: 7 %
Neutro Abs: 7 10*3/uL — ABNORMAL HIGH (ref 1.4–6.5)
Neutrophils Relative %: 70 %
Platelets: 288 10*3/uL (ref 150–440)
RBC: 4.48 MIL/uL (ref 3.80–5.20)
RDW: 15 % — ABNORMAL HIGH (ref 11.5–14.5)
WBC: 10.1 10*3/uL (ref 3.6–11.0)

## 2016-10-18 LAB — COMPREHENSIVE METABOLIC PANEL
ALT: 23 U/L (ref 14–54)
AST: 27 U/L (ref 15–41)
Albumin: 4 g/dL (ref 3.5–5.0)
Alkaline Phosphatase: 68 U/L (ref 38–126)
Anion gap: 10 (ref 5–15)
BUN: 14 mg/dL (ref 6–20)
CO2: 24 mmol/L (ref 22–32)
Calcium: 9.6 mg/dL (ref 8.9–10.3)
Chloride: 101 mmol/L (ref 101–111)
Creatinine, Ser: 0.92 mg/dL (ref 0.44–1.00)
GFR calc Af Amer: >60 mL/min (ref >60)
GFR calc non Af Amer: >60 mL/min (ref >60)
Glucose, Bld: 94 mg/dL (ref 65–99)
Potassium: 4 mmol/L (ref 3.5–5.1)
Sodium: 135 mmol/L (ref 135–145)
Total Bilirubin: 0.5 mg/dL (ref 0.3–1.2)
Total Protein: 7.7 g/dL (ref 6.5–8.1)

## 2016-10-18 NOTE — Progress Notes (Signed)
Patient does a lot of heavy lifting at her job that does cause her some right breast pain where she had surgery and is requesting FMLA form to be filled out if she has to be out of work due to this pain.

## 2016-10-19 LAB — CANCER ANTIGEN 27.29: CA 27.29: 18.6 U/mL (ref 0.0–38.6)

## 2016-11-08 ENCOUNTER — Ambulatory Visit (INDEPENDENT_AMBULATORY_CARE_PROVIDER_SITE_OTHER): Payer: 59 | Admitting: Family Medicine

## 2016-11-08 ENCOUNTER — Encounter: Payer: Self-pay | Admitting: Family Medicine

## 2016-11-08 VITALS — BP 138/70 | HR 80 | Temp 98.3°F | Ht 65.0 in | Wt 200.0 lb

## 2016-11-08 DIAGNOSIS — J40 Bronchitis, not specified as acute or chronic: Secondary | ICD-10-CM | POA: Diagnosis not present

## 2016-11-08 DIAGNOSIS — J0100 Acute maxillary sinusitis, unspecified: Secondary | ICD-10-CM

## 2016-11-08 DIAGNOSIS — J4521 Mild intermittent asthma with (acute) exacerbation: Secondary | ICD-10-CM | POA: Diagnosis not present

## 2016-11-08 MED ORDER — ALBUTEROL SULFATE HFA 108 (90 BASE) MCG/ACT IN AERS: 2.0000 | INHALATION_SPRAY | Freq: Four times a day (QID) | RESPIRATORY_TRACT | 0 refills | Status: DC | PRN

## 2016-11-08 MED ORDER — GUAIFENESIN-CODEINE 100-10 MG/5ML PO SYRP: 5.0000 mL | ORAL_SOLUTION | Freq: Three times a day (TID) | ORAL | 0 refills | Status: DC | PRN

## 2016-11-08 MED ORDER — AMOXICILLIN-POT CLAVULANATE 875-125 MG PO TABS: 1.0000 | ORAL_TABLET | Freq: Two times a day (BID) | ORAL | 0 refills | Status: DC

## 2016-11-08 NOTE — Progress Notes (Signed)
Name: Jill Walsh   MRN: [id number]    DOB: Jul 09, 1970   Date:11/08/2016       Progress Note  Subjective  Chief Complaint  Chief Complaint  Patient presents with  . Sinusitis    cough and cong, chills, thick production    Sinusitis  This is a new problem. The current episode started in the past 7 days. The problem has been gradually worsening since onset. There has been no fever. Her pain is at a severity of 2/10. The pain is mild. Associated symptoms include congestion, coughing, headaches, sinus pressure and a sore throat. Pertinent negatives include no chills, diaphoresis, ear pain, hoarse voice, neck pain or shortness of breath. Past treatments include oral decongestants. The treatment provided no relief.  Cough  This is a new problem. The current episode started in the past 7 days. The problem has been gradually worsening. The cough is productive of sputum. Associated symptoms include headaches, postnasal drip, a sore throat and wheezing. Pertinent negatives include no chest pain, chills, ear pain, fever, heartburn, myalgias, rash, shortness of breath or weight loss. She has tried a beta-agonist inhaler for the symptoms. There is no history of environmental allergies.    No problem-specific Assessment & Plan notes found for this encounter.   Past Medical History:  Diagnosis Date  . Breast cancer ([place]) 2016   RT MASTECTOMY    Past Surgical History:  Procedure Laterality Date  . BREAST BIOPSY Left    02/28/14 negative  . BREAST BIOPSY Right     02/2014 malignant  . MASTECTOMY Right 2016   BREAST CA  . TUBAL LIGATION      Family History  Problem Relation Age of Onset  . Cancer Mother   . Breast cancer Mother 74  . Stroke Brother   . Cancer Maternal Aunt     Social History   Social History  . Marital status: Single    Spouse name: N/A  . Number of children: N/A  . Years of education: N/A   Occupational History  . Not on file.   Social History Main Topics  .  Smoking status: Former Smoker    Years: 15.00  . Smokeless tobacco: Former Systems developer  . Alcohol use No  . Drug use: No  . Sexual activity: Not on file   Other Topics Concern  . Not on file   Social History Narrative  . No narrative on file    No Known Allergies  Outpatient Medications Prior to Visit  Medication Sig Dispense Refill  . acetaminophen (TYLENOL) 325 MG tablet Take 650 mg by mouth every 6 (six) hours as needed.    . cyclobenzaprine (FLEXERIL) 5 MG tablet Take 1 tablet (5 mg total) by mouth 3 (three) times daily as needed for muscle spasms. 20 tablet 0  . albuterol (PROVENTIL HFA;VENTOLIN HFA) 108 (90 Base) MCG/ACT inhaler Inhale 2 puffs into the lungs every 6 (six) hours as needed for wheezing or shortness of breath. 1 Inhaler 2  . predniSONE (DELTASONE) 10 MG tablet Take 6 tablets  today, on day 2 take 5 tablets, day 3 take 4 tablets, day 4 take 3 tablets, day 5 take  2 tablets and 1 tablet the last day (Patient not taking: Reported on 10/18/2016) 21 tablet 0  . traMADol (ULTRAM) 50 MG tablet Take 1 tablet (50 mg total) by mouth 2 (two) times daily as needed. (Patient not taking: Reported on 11/08/2016) 40 tablet 0   No facility-administered medications prior to visit.  Review of Systems  Constitutional: Negative for chills, diaphoresis, fever, malaise/fatigue and weight loss.  HENT: Positive for congestion, postnasal drip, sinus pressure and sore throat. Negative for ear discharge, ear pain and hoarse voice.   Eyes: Negative for blurred vision.  Respiratory: Positive for cough and wheezing. Negative for sputum production and shortness of breath.   Cardiovascular: Negative for chest pain, palpitations and leg swelling.  Gastrointestinal: Negative for abdominal pain, blood in stool, constipation, diarrhea, heartburn, melena and nausea.  Genitourinary: Negative for dysuria, frequency, hematuria and urgency.  Musculoskeletal: Negative for back pain, joint pain, myalgias and  neck pain.  Skin: Negative for rash.  Neurological: Positive for headaches. Negative for dizziness, tingling, sensory change and focal weakness.  Endo/Heme/Allergies: Negative for environmental allergies and polydipsia. Does not bruise/bleed easily.  Psychiatric/Behavioral: Negative for depression and suicidal ideas. The patient is not nervous/anxious and does not have insomnia.      Objective  Vitals:   11/08/16 1141  BP: 138/70  Pulse: 80  Temp: 98.3 F (36.8 C)  TempSrc: Oral  Weight: 200 lb (90.7 kg)  Height: 5\' 5"  (1.651 m)    Physical Exam  Constitutional: She is well-developed, well-nourished, and in no distress. No distress.  HENT:  Head: Normocephalic and atraumatic.  Right Ear: External ear normal.  Left Ear: External ear normal.  Nose: Right sinus exhibits maxillary sinus tenderness. Left sinus exhibits maxillary sinus tenderness.  Mouth/Throat: Posterior oropharyngeal erythema present.  Eyes: Pupils are equal, round, and reactive to light. Conjunctivae and EOM are normal. Right eye exhibits no discharge. Left eye exhibits no discharge.  Neck: Normal range of motion. Neck supple. No JVD present. No thyromegaly present.  Cardiovascular: Normal rate, regular rhythm, normal heart sounds and intact distal pulses.  Exam reveals no gallop and no friction rub.   No murmur heard. Pulmonary/Chest: Effort normal. No respiratory distress. She has wheezes. She has no rales.  Abdominal: Soft. Bowel sounds are normal. She exhibits no mass. There is no tenderness. There is no guarding.  Musculoskeletal: Normal range of motion. She exhibits no edema.  Lymphadenopathy:       Head (right side): Submandibular adenopathy present.       Head (left side): Submandibular adenopathy present.    She has no cervical adenopathy.  Neurological: She is alert. She has normal reflexes.  Skin: Skin is warm and dry. She is not diaphoretic.  Psychiatric: Mood and affect normal.  Nursing note and  vitals reviewed.     Assessment & Plan  Problem List Items Addressed This Visit    None    Visit Diagnoses    Acute maxillary sinusitis, recurrence not specified    -  Primary   Relevant Medications   amoxicillin-clavulanate (AUGMENTIN) 875-125 MG tablet   guaiFENesin-codeine (ROBITUSSIN AC) 100-10 MG/5ML syrup   Bronchitis       Relevant Medications   amoxicillin-clavulanate (AUGMENTIN) 875-125 MG tablet   guaiFENesin-codeine (ROBITUSSIN AC) 100-10 MG/5ML syrup   Mild intermittent asthma with acute exacerbation       Relevant Medications   albuterol (PROVENTIL HFA;VENTOLIN HFA) 108 (90 Base) MCG/ACT inhaler   guaiFENesin-codeine (ROBITUSSIN AC) 100-10 MG/5ML syrup      Meds ordered this encounter  Medications  . amoxicillin-clavulanate (AUGMENTIN) 875-125 MG tablet    Sig: Take 1 tablet by mouth 2 (two) times daily.    Dispense:  20 tablet    Refill:  0  . albuterol (PROVENTIL HFA;VENTOLIN HFA) 108 (90 Base) MCG/ACT inhaler  Sig: Inhale 2 puffs into the lungs every 6 (six) hours as needed for wheezing or shortness of breath.    Dispense:  1 Inhaler    Refill:  0  . guaiFENesin-codeine (ROBITUSSIN AC) 100-10 MG/5ML syrup    Sig: Take 5 mLs by mouth 3 (three) times daily as needed for cough.    Dispense:  100 mL    Refill:  0      Dr. Macon [facility]  11/08/16

## 2016-11-11 ENCOUNTER — Encounter: Payer: Self-pay | Admitting: Family Medicine

## 2016-11-11 ENCOUNTER — Ambulatory Visit
Admission: RE | Admit: 2016-11-11 | Discharge: 2016-11-11 | Disposition: A | Discharge status: Home / Self Care | Payer: 59 | Source: Ambulatory Visit | Attending: Family Medicine | Admitting: Family Medicine

## 2016-11-11 ENCOUNTER — Ambulatory Visit (INDEPENDENT_AMBULATORY_CARE_PROVIDER_SITE_OTHER): Payer: 59 | Admitting: Family Medicine

## 2016-11-11 VITALS — BP 120/80 | HR 80 | Temp 98.1°F | Ht 65.0 in | Wt 200.0 lb

## 2016-11-11 DIAGNOSIS — J40 Bronchitis, not specified as acute or chronic: Secondary | ICD-10-CM

## 2016-11-11 DIAGNOSIS — F17200 Nicotine dependence, unspecified, uncomplicated: Secondary | ICD-10-CM | POA: Diagnosis not present

## 2016-11-11 DIAGNOSIS — C50911 Malignant neoplasm of unspecified site of right female breast: Secondary | ICD-10-CM | POA: Diagnosis not present

## 2016-11-11 MED ORDER — PREDNISONE 10 MG PO TABS: ORAL_TABLET | ORAL | 0 refills | Status: DC

## 2016-11-11 MED ORDER — LEVOFLOXACIN 500 MG PO TABS: 500.0000 mg | ORAL_TABLET | Freq: Every day | ORAL | 0 refills | Status: DC

## 2016-11-11 NOTE — Progress Notes (Signed)
Name: Jill Walsh   MRN: [id number]    DOB: March 31, 1970   Date:11/11/2016       Progress Note  Subjective  Chief Complaint  Chief Complaint  Patient presents with  . Follow-up    sinusitis- has been on Augmentin x 3 days- cough is not better, cong is some better. Having diarrhea now and has tried to eat soup and vomitted.    Cough  This is a new problem. The current episode started 1 to 4 weeks ago (10 weeks). The problem has been unchanged. The problem occurs constantly. The cough is productive of sputum. Associated symptoms include headaches, shortness of breath and wheezing. Pertinent negatives include no chest pain, chills, ear congestion, ear pain, fever, heartburn, hemoptysis, myalgias, nasal congestion, postnasal drip, rash, rhinorrhea, sore throat, sweats or weight loss. Exacerbated by: still vaping. Risk factors for lung disease include smoking/tobacco exposure. She has tried a beta-agonist inhaler (antibiotic) for the symptoms. Her past medical history is significant for asthma and bronchitis. There is no history of environmental allergies or pneumonia.    No problem-specific Assessment & Plan notes found for this encounter.   Past Medical History:  Diagnosis Date  . Breast cancer ([place]) 2016   RT MASTECTOMY    Past Surgical History:  Procedure Laterality Date  . BREAST BIOPSY Left    02/28/14 negative  . BREAST BIOPSY Right     02/2014 malignant  . MASTECTOMY Right 2016   BREAST CA  . TUBAL LIGATION      Family History  Problem Relation Age of Onset  . Cancer Mother   . Breast cancer Mother 2  . Stroke Brother   . Cancer Maternal Aunt     Social History   Social History  . Marital status: Single    Spouse name: N/A  . Number of children: N/A  . Years of education: N/A   Occupational History  . Not on file.   Social History Main Topics  . Smoking status: Former Smoker    Years: 15.00  . Smokeless tobacco: Former Systems developer  . Alcohol use No  . Drug use:  No  . Sexual activity: Not on file   Other Topics Concern  . Not on file   Social History Narrative  . No narrative on file    No Known Allergies  Outpatient Medications Prior to Visit  Medication Sig Dispense Refill  . acetaminophen (TYLENOL) 325 MG tablet Take 650 mg by mouth every 6 (six) hours as needed.    Marland Kitchen albuterol (PROVENTIL HFA;VENTOLIN HFA) 108 (90 Base) MCG/ACT inhaler Inhale 2 puffs into the lungs every 6 (six) hours as needed for wheezing or shortness of breath. 1 Inhaler 0  . cyclobenzaprine (FLEXERIL) 5 MG tablet Take 1 tablet (5 mg total) by mouth 3 (three) times daily as needed for muscle spasms. 20 tablet 0  . guaiFENesin-codeine (ROBITUSSIN AC) 100-10 MG/5ML syrup Take 5 mLs by mouth 3 (three) times daily as needed for cough. 100 mL 0  . amoxicillin-clavulanate (AUGMENTIN) 875-125 MG tablet Take 1 tablet by mouth 2 (two) times daily. 20 tablet 0  . traMADol (ULTRAM) 50 MG tablet Take 1 tablet (50 mg total) by mouth 2 (two) times daily as needed. (Patient not taking: Reported on 11/08/2016) 40 tablet 0  . predniSONE (DELTASONE) 10 MG tablet Take 6 tablets  today, on day 2 take 5 tablets, day 3 take 4 tablets, day 4 take 3 tablets, day 5 take  2 tablets and 1 tablet  the last day (Patient not taking: Reported on 10/18/2016) 21 tablet 0   No facility-administered medications prior to visit.     Review of Systems  Constitutional: Negative for chills, fever, malaise/fatigue and weight loss.  HENT: Negative for ear discharge, ear pain, postnasal drip, rhinorrhea and sore throat.   Eyes: Negative for blurred vision.  Respiratory: Positive for cough, shortness of breath and wheezing. Negative for hemoptysis and sputum production.   Cardiovascular: Negative for chest pain, palpitations and leg swelling.  Gastrointestinal: Negative for abdominal pain, blood in stool, constipation, diarrhea, heartburn, melena and nausea.  Genitourinary: Negative for dysuria, frequency, hematuria  and urgency.  Musculoskeletal: Negative for back pain, joint pain, myalgias and neck pain.  Skin: Negative for rash.  Neurological: Positive for headaches. Negative for dizziness, tingling, sensory change and focal weakness.  Endo/Heme/Allergies: Negative for environmental allergies and polydipsia. Does not bruise/bleed easily.  Psychiatric/Behavioral: Negative for depression and suicidal ideas. The patient is not nervous/anxious and does not have insomnia.      Objective  Vitals:   11/11/16 1126  BP: 120/80  Pulse: 80  Temp: 98.1 F (36.7 C)  TempSrc: Oral  SpO2: 99%  Weight: 200 lb (90.7 kg)  Height: 5\' 5"  (1.651 m)    Physical Exam  Constitutional: She is well-developed, well-nourished, and in no distress. No distress.  HENT:  Head: Normocephalic and atraumatic.  Right Ear: External ear normal.  Left Ear: External ear normal.  Nose: Nose normal.  Mouth/Throat: Oropharynx is clear and moist.  Eyes: Pupils are equal, round, and reactive to light. Conjunctivae and EOM are normal. Right eye exhibits no discharge. Left eye exhibits no discharge.  Neck: Normal range of motion. Neck supple. No JVD present. No thyromegaly present.  Cardiovascular: Normal rate, regular rhythm, normal heart sounds and intact distal pulses.  Exam reveals no gallop and no friction rub.   No murmur heard. Pulmonary/Chest: Effort normal and breath sounds normal. No respiratory distress. She has no wheezes. She has no rales. She exhibits tenderness.  Abdominal: Soft. Bowel sounds are normal. She exhibits no mass. There is no tenderness. There is no guarding.  Musculoskeletal: Normal range of motion. She exhibits no edema.  Lymphadenopathy:    She has no cervical adenopathy.  Neurological: She is alert. She has normal reflexes.  Skin: Skin is warm and dry. She is not diaphoretic.  Psychiatric: Mood and affect normal.  Nursing note and vitals reviewed.     Assessment & Plan  Problem List Items  Addressed This Visit      Other   Current smoker   Breast cancer ([place])   Relevant Medications   predniSONE (DELTASONE) 10 MG tablet   levofloxacin (LEVAQUIN) 500 MG tablet    Other Visit Diagnoses    Bronchitis    -  Primary   Relevant Medications   predniSONE (DELTASONE) 10 MG tablet   levofloxacin (LEVAQUIN) 500 MG tablet   Other Relevant Orders   DG Chest 2 View      Meds ordered this encounter  Medications  . predniSONE (DELTASONE) 10 MG tablet    Sig: Take 6 tablets  today, on day 2 take 5 tablets, day 3 take 4 tablets, day 4 take 3 tablets, day 5 take  2 tablets and 1 tablet the last day    Dispense:  21 tablet    Refill:  0  . levofloxacin (LEVAQUIN) 500 MG tablet    Sig: Take 1 tablet (500 mg total) by mouth daily.  Dispense:  7 tablet    Refill:  0      Dr. Macon [facility]  11/11/16

## 2016-12-09 ENCOUNTER — Ambulatory Visit
Admission: RE | Admit: 2016-12-09 | Discharge: 2016-12-09 | Disposition: A | Discharge status: Home / Self Care | Payer: 59 | Source: Ambulatory Visit | Attending: Hematology and Oncology | Admitting: Hematology and Oncology

## 2016-12-09 DIAGNOSIS — Z171 Estrogen receptor negative status [ER-]: Secondary | ICD-10-CM

## 2016-12-09 DIAGNOSIS — C50911 Malignant neoplasm of unspecified site of right female breast: Secondary | ICD-10-CM

## 2016-12-09 DIAGNOSIS — Z1231 Encounter for screening mammogram for malignant neoplasm of breast: Secondary | ICD-10-CM | POA: Insufficient documentation

## 2017-01-25 ENCOUNTER — Ambulatory Visit: Payer: 59 | Admitting: Family Medicine

## 2017-01-25 ENCOUNTER — Encounter: Payer: Self-pay | Admitting: Family Medicine

## 2017-01-25 VITALS — BP 130/98 | HR 72 | Ht 65.0 in | Wt 200.0 lb

## 2017-01-25 DIAGNOSIS — M7052 Other bursitis of knee, left knee: Secondary | ICD-10-CM

## 2017-01-25 MED ORDER — MELOXICAM 7.5 MG PO TABS: 7.5000 mg | ORAL_TABLET | Freq: Every day | ORAL | 0 refills | Status: DC

## 2017-01-25 NOTE — Patient Instructions (Signed)
Bursitis Bursitis is inflammation and irritation of a bursa, which is one of the small, fluid-filled sacs that cushion and protect the moving parts of your body. These sacs are located between bones and muscles, muscle attachments, or skin areas next to bones. A bursa protects these structures from the wear and tear that results from frequent movement. An inflamed bursa causes pain and swelling. Fluid may build up inside the sac. Bursitis is most common near joints, especially the knees, elbows, hips, and shoulders. What are the causes? Bursitis can be caused by:  Injury from: ? A direct blow, like falling on your knee or elbow. ? Overuse of a joint (repetitive stress).  Infection. This can happen if bacteria gets into a bursa through a cut or scrape near a joint.  Diseases that cause joint inflammation, such as gout and rheumatoid arthritis.  What increases the risk? You may be at risk for bursitis if you:  Have a job or hobby that involves a lot of repetitive stress on your joints.  Have a condition that weakens your body's defense system (immune system), such as diabetes, cancer, or HIV.  Lift and reach overhead often.  Kneel or lean on hard surfaces often.  Run or walk often.  What are the signs or symptoms? The most common signs and symptoms of bursitis are:  Pain that gets worse when you move the affected body part or put weight on it.  Inflammation.  Stiffness.  Other signs and symptoms may include:  Redness.  Tenderness.  Warmth.  Pain that continues after rest.  Fever and chills. This may occur in bursitis caused by infection.  How is this diagnosed? Bursitis may be diagnosed by:  Medical history and physical exam.  MRI.  A procedure to drain fluid from the bursa with a needle (aspiration). The fluid may be checked for signs of infection or gout.  Blood tests to rule out other causes of inflammation.  How is this treated? Bursitis can usually be  treated at home with rest, ice, compression, and elevation (RICE). For mild bursitis, RICE treatment may be all you need. Other treatments may include:  Nonsteroidal anti-inflammatory drugs (NSAIDs) to treat pain and inflammation.  Corticosteroids to fight inflammation. You may have these drugs injected into and around the area of bursitis.  Aspiration of bursitis fluid to relieve pain and improve movement.  Antibiotic medicine to treat an infected bursa.  A splint, brace, or walking aid.  Physical therapy if you continue to have pain or limited movement.  Surgery to remove a damaged or infected bursa. This may be needed if you have a very bad case of bursitis or if other treatments have not worked.  Follow these instructions at home:  Take medicines only as directed by your health care provider.  If you were prescribed an antibiotic medicine, finish it all even if you start to feel better.  Rest the affected area as directed by your health care provider. ? Keep the area elevated. ? Avoid activities that make pain worse.  Apply ice to the injured area: ? Place ice in a plastic bag. ? Place a towel between your skin and the bag. ? Leave the ice on for 20 minutes, 2-3 times a day.  Use splints, braces, pads, or walking aids as directed by your health care provider.  Keep all follow-up visits as directed by your health care provider. This is important. How is this prevented?  Wear knee pads if you kneel often.    Wear sturdy running or walking shoes that fit you well.  Take regular breaks from repetitive activity.  Warm up by stretching before doing any strenuous activity.  Maintain a healthy weight or lose weight as recommended by your health care provider. Ask your health care provider if you need help.  Exercise regularly. Start any new physical activity gradually. Contact a health care provider if:  Your bursitis is not responding to treatment or home care.  You have  a fever.  You have chills. This information is not intended to replace advice given to you by your health care provider. Make sure you discuss any questions you have with your health care provider. Document Released: 01/23/2000 Document Revised: 07/03/2015 Document Reviewed: 04/16/2013 Elsevier Interactive Patient Education  2018 [place] Cyst A Baker cyst, also called a popliteal cyst, is a sac-like growth that forms at the back of the knee. The cyst forms when the fluid-filled sac (bursa) that cushions the knee joint becomes enlarged. The bursa that becomes a Baker cyst is located at the back of the knee joint. What are the causes? In most cases, a Baker cyst results from another knee problem that causes swelling inside the knee. This makes the fluid inside the knee joint (synovial fluid) flow into the bursa behind the knee, causing the bursa to enlarge. What increases the risk? You may be more likely to develop a Baker cyst if you already have a knee problem, such as:  A tear in cartilage that cushions the knee joint (meniscal tear).  A tear in the tissues that connect the bones of the knee joint (ligament tear).  Knee swelling from osteoarthritis, rheumatoid arthritis, or gout.  What are the signs or symptoms? A Baker cyst does not always cause symptoms. A lump behind the knee may be the only sign of the condition. The lump may be painful, especially when the knee is straightened. If the lump is painful, the pain may come and go. The knee may also be stiff. Symptoms may quickly get more severe if the cyst breaks open (ruptures). If your cyst ruptures, signs and symptoms may affect the knee and the back of the lower leg (calf) and may include:  Sudden or worsening pain.  Swelling.  Bruising.  How is this diagnosed? This condition may be diagnosed based on your symptoms and medical history. Your health care provider will also do a physical exam. This may  include:  Feeling the cyst to check whether it is tender.  Checking your knee for signs of another knee condition that causes swelling.  You may have imaging tests, such as:  X-rays.  MRI.  Ultrasound.  How is this treated? A Baker cyst that is not painful may go away without treatment. If the cyst gets large or painful, it will likely get better if the underlying knee problem is treated. Treatment for a Baker cyst may include:  Resting.  Keeping weight off of the knee. This means not leaning on the knee to support your body weight.  NSAIDs to reduce pain and swelling.  A procedure to drain the fluid from the cyst with a needle (aspiration). You may also get an injection of a medicine that reduces swelling (steroid).  Surgery. This may be needed if other treatments do not work. This usually involves correcting knee damage and removing the cyst.  Follow these instructions at home:  Take over-the-counter and prescription medicines only as told by your health care provider.  Rest and return  to your normal activities as told by your health care provider. Avoid activities that make pain or swelling worse. Ask your health care provider what activities are safe for you.  Keep all follow-up visits as told by your health care provider. This is important. Contact a health care provider if:  You have knee pain, stiffness, or swelling that does not get better. Get help right away if:  You have sudden or worsening pain and swelling in your calf area. This information is not intended to replace advice given to you by your health care provider. Make sure you discuss any questions you have with your health care provider. Document Released: 01/25/2005 Document Revised: 10/16/2015 Document Reviewed: 10/16/2015 Elsevier Interactive Patient Education  2018 [employer].

## 2017-01-25 NOTE — Progress Notes (Signed)
Name: Jill Walsh   MRN: [id number]    DOB: December 12, 1970   Date:01/25/2017       Progress Note  Subjective  Chief Complaint  Chief Complaint  Patient presents with  . Knee Pain    L) knee started hurting after walking back and forth on job. Feels like "it is going to give way"- mainly around the knee, but behind calf as well    Knee Pain   The incident occurred 12 to 24 hours ago. There was no injury mechanism. The pain is present in the left thigh and left knee. The pain is mild. The pain has been fluctuating since onset. Associated symptoms include numbness and tingling. Pertinent negatives include no inability to bear weight, loss of motion, loss of sensation or muscle weakness. The symptoms are aggravated by movement and weight bearing. She has tried NSAIDs for the symptoms. The treatment provided mild relief.    No problem-specific Assessment & Plan notes found for this encounter.   Past Medical History:  Diagnosis Date  . Breast cancer ([place]) 2016   RT MASTECTOMY    Past Surgical History:  Procedure Laterality Date  . BREAST BIOPSY Left    02/28/14 negative  . BREAST BIOPSY Right     02/2014 malignant  . MASTECTOMY Right 2016   BREAST CA  . TUBAL LIGATION      Family History  Problem Relation Age of Onset  . Cancer Mother   . Breast cancer Mother 30  . Stroke Brother   . Cancer Maternal Aunt     Social History   Socioeconomic History  . Marital status: Single    Spouse name: Not on file  . Number of children: Not on file  . Years of education: Not on file  . Highest education level: Not on file  Social Needs  . Financial resource strain: Not on file  . Food insecurity - worry: Not on file  . Food insecurity - inability: Not on file  . Transportation needs - medical: Not on file  . Transportation needs - non-medical: Not on file  Occupational History  . Not on file  Tobacco Use  . Smoking status: Former Smoker    Years: 15.00  . Smokeless tobacco:  Former Network engineer and Sexual Activity  . Alcohol use: No  . Drug use: No  . Sexual activity: Not on file  Other Topics Concern  . Not on file  Social History Narrative  . Not on file    No Known Allergies  Outpatient Medications Prior to Visit  Medication Sig Dispense Refill  . acetaminophen (TYLENOL) 325 MG tablet Take 650 mg by mouth every 6 (six) hours as needed.    Marland Kitchen albuterol (PROVENTIL HFA;VENTOLIN HFA) 108 (90 Base) MCG/ACT inhaler Inhale 2 puffs into the lungs every 6 (six) hours as needed for wheezing or shortness of breath. 1 Inhaler 0  . traMADol (ULTRAM) 50 MG tablet Take 1 tablet (50 mg total) by mouth 2 (two) times daily as needed. (Patient not taking: Reported on 11/08/2016) 40 tablet 0  . cyclobenzaprine (FLEXERIL) 5 MG tablet Take 1 tablet (5 mg total) by mouth 3 (three) times daily as needed for muscle spasms. 20 tablet 0  . guaiFENesin-codeine (ROBITUSSIN AC) 100-10 MG/5ML syrup Take 5 mLs by mouth 3 (three) times daily as needed for cough. 100 mL 0  . levofloxacin (LEVAQUIN) 500 MG tablet Take 1 tablet (500 mg total) by mouth daily. 7 tablet 0  . predniSONE (  DELTASONE) 10 MG tablet Take 6 tablets  today, on day 2 take 5 tablets, day 3 take 4 tablets, day 4 take 3 tablets, day 5 take  2 tablets and 1 tablet the last day 21 tablet 0   No facility-administered medications prior to visit.     Review of Systems  Constitutional: Negative for chills, fever, malaise/fatigue and weight loss.  HENT: Negative for ear discharge, ear pain and sore throat.   Eyes: Negative for blurred vision.  Respiratory: Negative for cough, sputum production, shortness of breath and wheezing.   Cardiovascular: Negative for chest pain, palpitations and leg swelling.  Gastrointestinal: Negative for abdominal pain, blood in stool, constipation, diarrhea, heartburn, melena and nausea.  Genitourinary: Negative for dysuria, frequency, hematuria and urgency.  Musculoskeletal: Positive for joint  pain and myalgias. Negative for back pain and neck pain.  Skin: Negative for rash.  Neurological: Positive for tingling and numbness. Negative for dizziness, sensory change, focal weakness and headaches.  Endo/Heme/Allergies: Negative for environmental allergies and polydipsia. Does not bruise/bleed easily.  Psychiatric/Behavioral: Negative for depression and suicidal ideas. The patient is not nervous/anxious and does not have insomnia.      Objective  Vitals:   01/25/17 1114  BP: (!) 130/98  Pulse: 72  Weight: 200 lb (90.7 kg)  Height: 5\' 5"  (1.651 m)    Physical Exam  Constitutional: She is well-developed, well-nourished, and in no distress. No distress.  HENT:  Head: Normocephalic and atraumatic.  Right Ear: External ear normal.  Left Ear: External ear normal.  Nose: Nose normal.  Mouth/Throat: Oropharynx is clear and moist.  Eyes: Conjunctivae and EOM are normal. Pupils are equal, round, and reactive to light. Right eye exhibits no discharge. Left eye exhibits no discharge.  Neck: Normal range of motion. Neck supple. No JVD present. No thyromegaly present.  Cardiovascular: Normal rate, regular rhythm, normal heart sounds and intact distal pulses. Exam reveals no gallop and no friction rub.  No murmur heard. Pulmonary/Chest: Effort normal and breath sounds normal. She has no wheezes. She has no rales.  Abdominal: Soft. Bowel sounds are normal. She exhibits no mass. There is no tenderness. There is no guarding.  Musculoskeletal: Normal range of motion. She exhibits no edema.       Left knee: She exhibits swelling. She exhibits normal range of motion and no erythema. Tenderness found. Medial joint line and lateral joint line tenderness noted. No MCL, no LCL and no patellar tendon tenderness noted.  Tender popliteal bursa  Lymphadenopathy:    She has no cervical adenopathy.  Neurological: She is alert. She has normal sensation, normal strength and normal reflexes.  Skin: Skin is  warm and dry. She is not diaphoretic.  Psychiatric: Mood and affect normal.  Nursing note and vitals reviewed.     Assessment & Plan  Problem List Items Addressed This Visit    None    Visit Diagnoses    Popliteal bursitis of left knee    -  Primary   Relevant Medications   meloxicam (MOBIC) 7.5 MG tablet      Meds ordered this encounter  Medications  . meloxicam (MOBIC) 7.5 MG tablet    Sig: Take 1 tablet (7.5 mg total) by mouth daily.    Dispense:  30 tablet    Refill:  0      Dr. [facility]  01/25/17

## 2017-03-25 ENCOUNTER — Ambulatory Visit (INDEPENDENT_AMBULATORY_CARE_PROVIDER_SITE_OTHER): Payer: 59 | Admitting: Family Medicine

## 2017-03-25 ENCOUNTER — Encounter: Payer: Self-pay | Admitting: Family Medicine

## 2017-03-25 VITALS — BP 142/92 | HR 88 | Ht 65.0 in | Wt 202.0 lb

## 2017-03-25 DIAGNOSIS — C50911 Malignant neoplasm of unspecified site of right female breast: Secondary | ICD-10-CM

## 2017-03-25 DIAGNOSIS — R0789 Other chest pain: Secondary | ICD-10-CM | POA: Diagnosis not present

## 2017-03-25 DIAGNOSIS — Z171 Estrogen receptor negative status [ER-]: Secondary | ICD-10-CM

## 2017-03-25 MED ORDER — HYDROCODONE-ACETAMINOPHEN 5-325 MG PO TABS: 1.0000 | ORAL_TABLET | Freq: Four times a day (QID) | ORAL | 0 refills | Status: DC | PRN

## 2017-03-25 NOTE — Progress Notes (Signed)
Name: Jill Walsh   MRN: [id number]    DOB: 1970-03-01   Date:03/25/2017       Progress Note  Subjective  Chief Complaint  Chief Complaint  Patient presents with  . Breast Pain    hurting x 2 days, but got worse today after lifting things at work. Sharp pain on R) side. Hx breast cancer x 3 years. March 12- has appt with oncology    Chest Pain   This is a new (chest wall pain) problem. The current episode started yesterday. The onset quality is sudden. The problem occurs constantly. The problem has been waxing and waning. The pain is present in the lateral region. The pain is at a severity of 9/10. The pain is moderate. The quality of the pain is described as sharp and burning. The pain does not radiate. Pertinent negatives include no abdominal pain, back pain, cough, dizziness, exertional chest pressure, fever, headaches, irregular heartbeat, malaise/fatigue, nausea, numbness, orthopnea, palpitations, PND, shortness of breath or sputum production. Associated symptoms comments: Hx breast cancer with right mastectomy. The pain is aggravated by nothing. The treatment provided moderate relief.    No problem-specific Assessment & Plan notes found for this encounter.   Past Medical History:  Diagnosis Date  . Breast cancer ([place]) 2016   RT MASTECTOMY    Past Surgical History:  Procedure Laterality Date  . BREAST BIOPSY Left    02/28/14 negative  . BREAST BIOPSY Right     02/2014 malignant  . MASTECTOMY Right 2016   BREAST CA  . TUBAL LIGATION      Family History  Problem Relation Age of Onset  . Cancer Mother   . Breast cancer Mother 78  . Stroke Brother   . Cancer Maternal Aunt     Social History   Socioeconomic History  . Marital status: Single    Spouse name: Not on file  . Number of children: Not on file  . Years of education: Not on file  . Highest education level: Not on file  Social Needs  . Financial resource strain: Not on file  . Food insecurity - worry: Not  on file  . Food insecurity - inability: Not on file  . Transportation needs - medical: Not on file  . Transportation needs - non-medical: Not on file  Occupational History  . Not on file  Tobacco Use  . Smoking status: Former Smoker    Years: 15.00  . Smokeless tobacco: Former Network engineer and Sexual Activity  . Alcohol use: No  . Drug use: No  . Sexual activity: Not on file  Other Topics Concern  . Not on file  Social History Narrative  . Not on file    No Known Allergies  Outpatient Medications Prior to Visit  Medication Sig Dispense Refill  . acetaminophen (TYLENOL) 325 MG tablet Take 650 mg by mouth every 6 (six) hours as needed.    Marland Kitchen albuterol (PROVENTIL HFA;VENTOLIN HFA) 108 (90 Base) MCG/ACT inhaler Inhale 2 puffs into the lungs every 6 (six) hours as needed for wheezing or shortness of breath. 1 Inhaler 0  . meloxicam (MOBIC) 7.5 MG tablet Take 1 tablet (7.5 mg total) by mouth daily. 30 tablet 0  . traMADol (ULTRAM) 50 MG tablet Take 1 tablet (50 mg total) by mouth 2 (two) times daily as needed. (Patient not taking: Reported on 11/08/2016) 40 tablet 0   No facility-administered medications prior to visit.     Review of Systems  Constitutional:  Negative for chills, fever, malaise/fatigue and weight loss.  HENT: Negative for ear discharge, ear pain and sore throat.   Eyes: Negative for blurred vision.  Respiratory: Negative for cough, sputum production, shortness of breath and wheezing.   Cardiovascular: Positive for chest pain. Negative for palpitations, orthopnea, leg swelling and PND.  Gastrointestinal: Negative for abdominal pain, blood in stool, constipation, diarrhea, heartburn, melena and nausea.  Genitourinary: Negative for dysuria, frequency, hematuria and urgency.  Musculoskeletal: Negative for back pain, joint pain, myalgias and neck pain.  Skin: Negative for rash.  Neurological: Negative for dizziness, tingling, sensory change, focal weakness, numbness  and headaches.  Endo/Heme/Allergies: Negative for environmental allergies and polydipsia. Does not bruise/bleed easily.  Psychiatric/Behavioral: Negative for depression and suicidal ideas. The patient is not nervous/anxious and does not have insomnia.      Objective  Vitals:   03/25/17 1548  BP: (!) 142/92  Pulse: 88  Weight: 202 lb (91.6 kg)  Height: 5\' 5"  (1.651 m)    Physical Exam  Constitutional: She is well-developed, well-nourished, and in no distress. No distress.  HENT:  Head: Normocephalic and atraumatic.  Right Ear: External ear normal.  Left Ear: External ear normal.  Nose: Nose normal.  Mouth/Throat: Oropharynx is clear and moist.  Eyes: Conjunctivae and EOM are normal. Pupils are equal, round, and reactive to light. Right eye exhibits no discharge. Left eye exhibits no discharge.  Neck: Normal range of motion. Neck supple. No JVD present. No thyromegaly present.  Cardiovascular: Normal rate, regular rhythm, normal heart sounds and intact distal pulses. Exam reveals no gallop and no friction rub.  No murmur heard. Pulmonary/Chest: Effort normal and breath sounds normal. She has no wheezes. She has no rales. She exhibits tenderness and bony tenderness.    Abdominal: Soft. Bowel sounds are normal. She exhibits no mass. There is no tenderness. There is no guarding.  Musculoskeletal: Normal range of motion. She exhibits no edema.  Lymphadenopathy:    She has no cervical adenopathy.  Neurological: She is alert. She has normal reflexes.  Skin: Skin is warm and dry. No lesion noted. Rash is not vesicular. She is not diaphoretic. No erythema.  No rash  Psychiatric: Mood and affect normal.  Nursing note and vitals reviewed.     Assessment & Plan  Problem List Items Addressed This Visit      Other   Breast cancer ([place])    Other Visit Diagnoses    Right-sided chest wall pain    -  Primary   Relevant Medications   HYDROcodone-acetaminophen (NORCO/VICODIN) 5-325  MG tablet   Other Relevant Orders   Ambulatory referral to Oncology      Meds ordered this encounter  Medications  . HYDROcodone-acetaminophen (NORCO/VICODIN) 5-325 MG tablet    Sig: Take 1 tablet by mouth every 6 (six) hours as needed for moderate pain.    Dispense:  30 tablet    Refill:  0      Dr. [facility]  03/25/17

## 2017-03-27 NOTE — Progress Notes (Signed)
[facility] day:  03/28/17   Chief Complaint: Jill Walsh is a 47 y.o. 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  HPI: The patient was last seen in the medical oncology clinic on 10/18/2016.  At that time, she described job related fatigue. She was tender over her right mastectomy site.  Labs were unremarkable.  CA27.29 was normal.  Left sided mammogram on 12/09/2016 revealed no mammographic evidence of malignancy.  During the interm, patient notes that things are going "good". 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 Patient experienced an episode of chest pain while at work on Friday. She was sent to PCP from work for further evaluation. She states, "He did an exam and told me I was really tense". Patient sent home and advised to use NSAIDs. Patient notes frequent pressure in her chest on her non-surgical side. She has a prosthesis and mastectomy bra that she uses.   She is having "very very heavy" menstrual cycles that are irregular. She notes that her cramping is "terrible". She has not been seen in consult by GYN.   Patient is otherwise doing well. She denies any B symptoms or interval infections. Patient is eating well. Her weight is down 4 pounds. Patient denies pain in the clinic today.  She performs monthly self breast examinations as recommended.    Past Medical History:  Diagnosis Date  . Breast cancer ([place]) 2016   RT MASTECTOMY    Past Surgical History:  Procedure Laterality Date  . BREAST BIOPSY Left    02/28/14 negative  . BREAST BIOPSY Right     02/2014 malignant  . MASTECTOMY Right 2016   BREAST CA  . TUBAL LIGATION      Family History  Problem Relation Age of Onset  . Cancer Mother   . Breast cancer Mother 58  . Stroke Brother   . Cancer Maternal Aunt      Social History:  reports that she has quit smoking. She quit after 15.00 years of use. She has quit using smokeless tobacco. She reports that she does not drink alcohol or use drugs.  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  The patient is accompanied by her fiance, [name], today.  They are building a house together.  Allergies: No Known Allergies  Current Medications: Current Outpatient Medications  Medication Sig Dispense Refill  . acetaminophen (TYLENOL) 325 MG tablet Take 650 mg by mouth every 6 (six) hours as needed.    Marland Kitchen albuterol (PROVENTIL HFA;VENTOLIN HFA) 108 (90 Base) MCG/ACT inhaler Inhale 2 puffs into the lungs every 6 (six) hours as needed for wheezing or shortness of breath. 1 Inhaler 0  . HYDROcodone-acetaminophen (NORCO/VICODIN) 5-325 MG tablet Take 1 tablet by mouth every 6 (six) hours as needed for moderate pain. 30 tablet 0   No current facility-administered medications for this visit.     Review of Systems:  GENERAL:  Feels "fine".  No fevers or sweats.  Weight down 4 pounds. PERFORMANCE STATUS (ECOG):  0 HEENT:  No visual changes, runny nose, sore throat, mouth sores or tenderness. Lungs:  No shortness of breath or cough.  No hemoptysis.  Cardiac:  No chest pain, palpitations, orthopnea, or PND. Breasts:  No pain.  No skin changes or  nipple discharge. GI:  No nausea, vomiting, diarrhea, constipation, melena or hematochezia. GU:  No urgency, frequency, dysuria, or hematuria.  Heavy menses. Musculoskeletal:  No back pain.  No joint pain.  No muscle tenderness. Extremities:  No pain or swelling. Skin:  No rashes or skin changes. Neuro:  No headache, numbness or weakness, balance or coordination issues. Endocrine:  No diabetes, thyroid issues, hot flashes or night sweats. Psych:  No mood changes, depression or anxiety. Pain:  No focal pain. Review of systems:  All other systems reviewed and found to  be negative.  Physical Exam: Blood pressure (!) 141/88, pulse (!) 103, temperature 98.9 F (37.2 C), temperature source Tympanic, resp. rate 20, weight 198 lb 9 oz (90.1 kg), last menstrual period 03/17/2017. GENERAL:  Well developed, well nourished, woman sitting comfortably in the exam room in no acute distress.  She is smiling. MENTAL STATUS:  Alert and oriented to person, place and time. HEAD:   Long curly brown hair.  Normocephalic, atraumatic, face symmetric, no Cushingoid features. EYES:  Glasses.  Brown eyes with questionable early bilateral arcus.  Pupils equal round and reactive to light and accomodation.  No conjunctivitis or scleral icterus. ENT:  Oropharynx clear without lesion.  Tongue normal. Mucous membranes moist.  RESPIRATORY:  Clear to auscultation without rales, wheezes or rhonchi. CARDIOVASCULAR:  Regular rate and rhythm without murmur, rub or gallop. BREAST:  Right sided mastectomy without erythema, nodularity or skin changes. Tender to palpation.  Left breast with fibrocystic changes in the upper outer quadrant (no change).  No masses, skin changes or nipple discharge.  ABDOMEN:  Soft, non-tender, with active bowel sounds, and no hepatosplenomegaly.  No masses. SKIN:  No rashes, ulcers or lesions. EXTREMITIES: No edema, no skin discoloration or tenderness.  No palpable cords. LYMPH NODES: No palpable cervical, supraclavicular, axillary or inguinal adenopathy  NEUROLOGICAL: Unremarkable. PSYCH:  Appropriate.    Appointment on 03/28/2017  Component Date Value Ref Range Status  . Sodium 03/28/2017 134* 135 - 145 mmol/L Final  . Potassium 03/28/2017 3.6  3.5 - 5.1 mmol/L Final  . Chloride 03/28/2017 102  101 - 111 mmol/L Final  . CO2 03/28/2017 22  22 - 32 mmol/L Final  . Glucose, Bld 03/28/2017 115* 65 - 99 mg/dL Final  . BUN 03/28/2017 10  6 - 20 mg/dL Final  . Creatinine, Ser 03/28/2017 0.84  0.44 - 1.00 mg/dL Final  . Calcium 03/28/2017 9.3  8.9 - 10.3 mg/dL Final   . Total Protein 03/28/2017 7.5  6.5 - 8.1 g/dL Final  . Albumin 03/28/2017 3.8  3.5 - 5.0 g/dL Final  . AST 03/28/2017 20  15 - 41 U/L Final  . ALT 03/28/2017 21  14 - 54 U/L Final  . Alkaline Phosphatase 03/28/2017 75  38 - 126 U/L Final  . Total Bilirubin 03/28/2017 0.4  0.3 - 1.2 mg/dL Final  . GFR calc non Af Amer 03/28/2017 >60  >60 mL/min Final  . GFR calc Af Amer 03/28/2017 >60  >60 mL/min Final   Comment: (NOTE) The eGFR has been calculated using the CKD EPI equation. This calculation has not been validated in all clinical situations. eGFR's persistently <60 mL/min signify possible Chronic Kidney Disease.   Georgiann Hahn gap 03/28/2017 10  5 - 15 Final   Performed at [facility], [address] [place], [place] [address]  . WBC 03/28/2017 11.4* 3.6 - 11.0 K/uL Final  . RBC 03/28/2017 4.41  3.80 - 5.20 MIL/uL Final  .  Hemoglobin 03/28/2017 12.5  12.0 - 16.0 g/dL Final  . HCT 03/28/2017 37.7  35.0 - 47.0 % Final  . MCV 03/28/2017 85.4  80.0 - 100.0 fL Final  . MCH 03/28/2017 28.3  26.0 - 34.0 pg Final  . MCHC 03/28/2017 33.1  32.0 - 36.0 g/dL Final  . RDW 03/28/2017 15.0* 11.5 - 14.5 % Final  . Platelets 03/28/2017 325  150 - 440 K/uL Final  . Neutrophils Relative % 03/28/2017 73  % Final  . Neutro Abs 03/28/2017 8.3* 1.4 - 6.5 K/uL Final  . Lymphocytes Relative 03/28/2017 21  % Final  . Lymphs Abs 03/28/2017 2.4  1.0 - 3.6 K/uL Final  . Monocytes Relative 03/28/2017 5  % Final  . Monocytes Absolute 03/28/2017 0.6  0.2 - 0.9 K/uL Final  . Eosinophils Relative 03/28/2017 1  % Final  . Eosinophils Absolute 03/28/2017 0.1  0 - 0.7 K/uL Final  . Basophils Relative 03/28/2017 0  % Final  . Basophils Absolute 03/28/2017 0.0  0 - 0.1 K/uL Final   Performed at [facility], [address] [place], [place] [address]    Assessment:  Jill Walsh is a 47 y.o. 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  No  lymph nodes were removed.  Tumor was ER/PR negative and Her2/neu 3+.  Pathologic stage was [id number].    Left breast mammogram and ultrasound on 12/06/2014 revealed no mammographic or sonographic evidence of malignancy in the left breast.  There was an incidental note of two less than 4 mm benign appearing cysts at the 9:30 o'clock 9 cm and 10 cm from the nipple.  Left sided screening mammogram on 12/09/2016 revealed no mammographic evidence of malignancy.  CA27.29 has been followed: 24.4 on 04/30/2014, 18.0 on 11/25/2014, 26.8 on 03/14/2015, 20.6 on 07/14/2015, 19.1 on 12/11/2015, 21.6 on 04/15/2016, 18.6 on 10/18/2016 and 17.6 on 03/29/2017.  MyRisk genetic testing revealed no clinically significant mutation.  She has a mother who developed breast cancer at age 204  A maternal aunt has colon cancer.    Symptomatically, she complains of pressure contralateral to her mastectomy site that is caused by her intact breast "pulling". She is sore and tender over her right mastectomy site.  She has heavy menses.  Exam reveals fibrocystic changes in the LEFT breast.  Labs are unremarkable.   Plan: 1.  Labs today: CBC with diff, CMP, CA27.29. 2.  Review interval mammogram- no evidence of malignancy. Mammogram due to 12/09/2017. 3.  RTC in 6 months for MD assessment and labs (CBC with diff, CMP, CA27.29).   BHonor Loh NP  03/28/2017, 4:22 PM   I saw and evaluated the patient, participating in the key portions of the service and reviewing pertinent diagnostic studies and records.  I reviewed the nurse practitioner's note and agree with the findings and the plan.  The assessment and plan were discussed with the patient.  A few questions were asked by the patient and answered.   MLequita Asal MD  03/28/2017 , 4:22 PM

## 2017-03-28 ENCOUNTER — Inpatient Hospital Stay: Payer: 59

## 2017-03-28 ENCOUNTER — Inpatient Hospital Stay: Payer: 59 | Attending: Hematology and Oncology | Admitting: Hematology and Oncology

## 2017-03-28 ENCOUNTER — Other Ambulatory Visit: Payer: Self-pay | Admitting: *Deleted

## 2017-03-28 ENCOUNTER — Encounter: Payer: Self-pay | Admitting: Hematology and Oncology

## 2017-03-28 VITALS — BP 141/88 | HR 103 | Temp 98.9°F | Resp 20 | Wt 198.6 lb

## 2017-03-28 DIAGNOSIS — Z803 Family history of malignant neoplasm of breast: Secondary | ICD-10-CM | POA: Diagnosis not present

## 2017-03-28 DIAGNOSIS — C50911 Malignant neoplasm of unspecified site of right female breast: Secondary | ICD-10-CM | POA: Insufficient documentation

## 2017-03-28 DIAGNOSIS — Z9011 Acquired absence of right breast and nipple: Secondary | ICD-10-CM | POA: Diagnosis not present

## 2017-03-28 DIAGNOSIS — Z171 Estrogen receptor negative status [ER-]: Secondary | ICD-10-CM

## 2017-03-28 DIAGNOSIS — Z87891 Personal history of nicotine dependence: Secondary | ICD-10-CM | POA: Diagnosis not present

## 2017-03-28 DIAGNOSIS — N921 Excessive and frequent menstruation with irregular cycle: Secondary | ICD-10-CM | POA: Diagnosis not present

## 2017-03-28 DIAGNOSIS — Z853 Personal history of malignant neoplasm of breast: Secondary | ICD-10-CM

## 2017-03-28 DIAGNOSIS — N922 Excessive menstruation at puberty: Secondary | ICD-10-CM | POA: Insufficient documentation

## 2017-03-28 LAB — CBC WITH DIFFERENTIAL/PLATELET
Basophils Absolute: 0 10*3/uL (ref 0–0.1)
Basophils Relative: 0 %
Eosinophils Absolute: 0.1 10*3/uL (ref 0–0.7)
Eosinophils Relative: 1 %
HCT: 37.7 % (ref 35.0–47.0)
Hemoglobin: 12.5 g/dL (ref 12.0–16.0)
Lymphocytes Relative: 21 %
Lymphs Abs: 2.4 10*3/uL (ref 1.0–3.6)
MCH: 28.3 pg (ref 26.0–34.0)
MCHC: 33.1 g/dL (ref 32.0–36.0)
MCV: 85.4 fL (ref 80.0–100.0)
Monocytes Absolute: 0.6 10*3/uL (ref 0.2–0.9)
Monocytes Relative: 5 %
Neutro Abs: 8.3 10*3/uL — ABNORMAL HIGH (ref 1.4–6.5)
Neutrophils Relative %: 73 %
Platelets: 325 10*3/uL (ref 150–440)
RBC: 4.41 MIL/uL (ref 3.80–5.20)
RDW: 15 % — ABNORMAL HIGH (ref 11.5–14.5)
WBC: 11.4 10*3/uL — ABNORMAL HIGH (ref 3.6–11.0)

## 2017-03-28 LAB — COMPREHENSIVE METABOLIC PANEL
ALT: 21 U/L (ref 14–54)
AST: 20 U/L (ref 15–41)
Albumin: 3.8 g/dL (ref 3.5–5.0)
Alkaline Phosphatase: 75 U/L (ref 38–126)
Anion gap: 10 (ref 5–15)
BUN: 10 mg/dL (ref 6–20)
CO2: 22 mmol/L (ref 22–32)
Calcium: 9.3 mg/dL (ref 8.9–10.3)
Chloride: 102 mmol/L (ref 101–111)
Creatinine, Ser: 0.84 mg/dL (ref 0.44–1.00)
GFR calc Af Amer: >60 mL/min (ref >60)
GFR calc non Af Amer: >60 mL/min (ref >60)
Glucose, Bld: 115 mg/dL — ABNORMAL HIGH (ref 65–99)
Potassium: 3.6 mmol/L (ref 3.5–5.1)
Sodium: 134 mmol/L — ABNORMAL LOW (ref 135–145)
Total Bilirubin: 0.4 mg/dL (ref 0.3–1.2)
Total Protein: 7.5 g/dL (ref 6.5–8.1)

## 2017-03-28 NOTE — Progress Notes (Signed)
Patient here today for review of testing.  Offers no complaints today.

## 2017-03-29 LAB — CA 27.29 (SERIAL MONITOR): CA 27.29: 17.6 U/mL (ref 0.0–38.6)

## 2017-04-19 ENCOUNTER — Ambulatory Visit: Payer: Self-pay | Admitting: Hematology and Oncology

## 2017-04-19 ENCOUNTER — Other Ambulatory Visit: Payer: 59

## 2017-04-21 ENCOUNTER — Other Ambulatory Visit: Payer: 59

## 2017-04-21 ENCOUNTER — Ambulatory Visit: Payer: 59 | Admitting: Hematology and Oncology

## 2017-04-25 ENCOUNTER — Ambulatory Visit: Payer: Self-pay | Admitting: Hematology and Oncology

## 2017-04-25 ENCOUNTER — Other Ambulatory Visit: Payer: 59

## 2017-05-30 ENCOUNTER — Ambulatory Visit (INDEPENDENT_AMBULATORY_CARE_PROVIDER_SITE_OTHER): Payer: 59 | Admitting: Family Medicine

## 2017-05-30 ENCOUNTER — Encounter: Payer: Self-pay | Admitting: Family Medicine

## 2017-05-30 VITALS — BP 118/78 | HR 78 | Temp 98.6°F | Resp 16 | Ht 65.0 in | Wt 196.0 lb

## 2017-05-30 DIAGNOSIS — J410 Simple chronic bronchitis: Secondary | ICD-10-CM

## 2017-05-30 DIAGNOSIS — J4521 Mild intermittent asthma with (acute) exacerbation: Secondary | ICD-10-CM | POA: Diagnosis not present

## 2017-05-30 DIAGNOSIS — M940 Chondrocostal junction syndrome [Tietze]: Secondary | ICD-10-CM | POA: Diagnosis not present

## 2017-05-30 MED ORDER — MONTELUKAST SODIUM 10 MG PO TABS
10.0000 mg | ORAL_TABLET | Freq: Every day | ORAL | 3 refills | Status: DC
Start: 2017-05-30 — End: 2017-08-18

## 2017-05-30 MED ORDER — ALBUTEROL SULFATE HFA 108 (90 BASE) MCG/ACT IN AERS: 2.0000 | INHALATION_SPRAY | Freq: Four times a day (QID) | RESPIRATORY_TRACT | 2 refills | Status: DC | PRN

## 2017-05-30 MED ORDER — GUAIFENESIN-CODEINE 100-10 MG/5ML PO SYRP: 5.0000 mL | ORAL_SOLUTION | Freq: Three times a day (TID) | ORAL | 0 refills | Status: DC | PRN

## 2017-05-30 MED ORDER — ALBUTEROL SULFATE (2.5 MG/3ML) 0.083% IN NEBU: 2.5000 mg | INHALATION_SOLUTION | Freq: Once | RESPIRATORY_TRACT | Status: DC

## 2017-05-30 NOTE — Progress Notes (Signed)
Name: Jill Walsh   MRN: [id number]    DOB: June 06, 1970   Date:05/30/2017       Progress Note  Subjective  Chief Complaint  Chief Complaint  Patient presents with  . Cough    Cough started yesterday no fever no bodyache.     Cough  This is a new problem. The current episode started yesterday. The problem has been waxing and waning. The cough is productive of sputum (clear). Associated symptoms include shortness of breath. Pertinent negatives include no chest pain, chills, ear congestion, ear pain, fever, headaches, heartburn, hemoptysis, myalgias, postnasal drip, rash, rhinorrhea, sore throat, sweats, weight loss or wheezing. Nothing aggravates the symptoms. She has tried a beta-agonist inhaler for the symptoms. The treatment provided moderate relief. Her past medical history is significant for asthma. There is no history of bronchiectasis, bronchitis, COPD, emphysema, environmental allergies or pneumonia.    No problem-specific Assessment & Plan notes found for this encounter.   Past Medical History:  Diagnosis Date  . Breast cancer ([place]) 2016   RT MASTECTOMY    Past Surgical History:  Procedure Laterality Date  . BREAST BIOPSY Left    02/28/14 negative  . BREAST BIOPSY Right     02/2014 malignant  . MASTECTOMY Right 2016   BREAST CA  . TUBAL LIGATION      Family History  Problem Relation Age of Onset  . Cancer Mother   . Breast cancer Mother 43  . Stroke Brother   . Cancer Maternal Aunt     Social History   Socioeconomic History  . Marital status: Single    Spouse name: Not on file  . Number of children: Not on file  . Years of education: Not on file  . Highest education level: Not on file  Occupational History  . Not on file  Social Needs  . Financial resource strain: Not on file  . Food insecurity:    Worry: Not on file    Inability: Not on file  . Transportation needs:    Medical: Not on file    Non-medical: Not on file  Tobacco Use  . Smoking status:  Former Smoker    Years: 15.00  . Smokeless tobacco: Former Network engineer and Sexual Activity  . Alcohol use: No  . Drug use: No  . Sexual activity: Not on file  Lifestyle  . Physical activity:    Days per week: Not on file    Minutes per session: Not on file  . Stress: Not on file  Relationships  . Social connections:    Talks on phone: Not on file    Gets together: Not on file    Attends religious service: Not on file    Active member of club or organization: Not on file    Attends meetings of clubs or organizations: Not on file    Relationship status: Not on file  . Intimate partner violence:    Fear of current or ex partner: Not on file    Emotionally abused: Not on file    Physically abused: Not on file    Forced sexual activity: Not on file  Other Topics Concern  . Not on file  Social History Narrative  . Not on file    No Known Allergies  Outpatient Medications Prior to Visit  Medication Sig Dispense Refill  . acetaminophen (TYLENOL) 325 MG tablet Take 650 mg by mouth every 6 (six) hours as needed.    Marland Kitchen albuterol (PROVENTIL HFA;VENTOLIN  HFA) 108 (90 Base) MCG/ACT inhaler Inhale 2 puffs into the lungs every 6 (six) hours as needed for wheezing or shortness of breath. 1 Inhaler 0  . HYDROcodone-acetaminophen (NORCO/VICODIN) 5-325 MG tablet Take 1 tablet by mouth every 6 (six) hours as needed for moderate pain. 30 tablet 0   No facility-administered medications prior to visit.     Review of Systems  Constitutional: Negative for chills, fever, malaise/fatigue and weight loss.  HENT: Negative for ear discharge, ear pain, postnasal drip, rhinorrhea and sore throat.   Eyes: Negative for blurred vision.  Respiratory: Positive for cough and shortness of breath. Negative for hemoptysis, sputum production and wheezing.   Cardiovascular: Negative for chest pain, palpitations and leg swelling.  Gastrointestinal: Negative for abdominal pain, blood in stool, constipation,  diarrhea, heartburn, melena and nausea.  Genitourinary: Negative for dysuria, frequency, hematuria and urgency.  Musculoskeletal: Negative for back pain, joint pain, myalgias and neck pain.  Skin: Negative for rash.  Neurological: Negative for dizziness, tingling, sensory change, focal weakness and headaches.  Endo/Heme/Allergies: Negative for environmental allergies and polydipsia. Does not bruise/bleed easily.  Psychiatric/Behavioral: Negative for depression and suicidal ideas. The patient is not nervous/anxious and does not have insomnia.      Objective  Vitals:   05/30/17 1628 05/30/17 1709  BP: 118/78   Pulse: 78   Resp: 16   Temp: 98.6 F (37 C)   SpO2: 99% 99%  Weight: 196 lb (88.9 kg)   Height: 5\' 5"  (1.651 m)     Physical Exam  Constitutional: No distress.  HENT:  Head: Normocephalic and atraumatic.  Right Ear: External ear normal.  Left Ear: External ear normal.  Nose: Nose normal.  Mouth/Throat: Oropharynx is clear and moist.  Eyes: Pupils are equal, round, and reactive to light. Conjunctivae and EOM are normal. Right eye exhibits no discharge. Left eye exhibits no discharge.  Neck: Normal range of motion. Neck supple. No JVD present. No thyromegaly present.  Cardiovascular: Normal rate, regular rhythm, normal heart sounds and intact distal pulses. Exam reveals no gallop and no friction rub.  No murmur heard. Pulmonary/Chest: Effort normal. No respiratory distress. She has no decreased breath sounds. She has wheezes. She has no rales. She exhibits tenderness.  Abdominal: Soft. Bowel sounds are normal. She exhibits no mass. There is no tenderness. There is no guarding.  Musculoskeletal: Normal range of motion. She exhibits no edema.  Lymphadenopathy:    She has no cervical adenopathy.  Neurological: She is alert. She has normal reflexes.  Skin: Skin is warm and dry. She is not diaphoretic.  Nursing note and vitals reviewed.     Assessment & Plan  Problem  List Items Addressed This Visit      Respiratory   Simple chronic bronchitis ([facility]) - Primary   Relevant Medications   montelukast (SINGULAIR) 10 MG tablet   guaiFENesin-codeine (ROBITUSSIN AC) 100-10 MG/5ML syrup    Other Visit Diagnoses    Mild intermittent asthma with acute exacerbation       Relevant Medications   montelukast (SINGULAIR) 10 MG tablet   albuterol (PROVENTIL HFA;VENTOLIN HFA) 108 (90 Base) MCG/ACT inhaler   Costochondritis          Meds ordered this encounter  Medications  . montelukast (SINGULAIR) 10 MG tablet    Sig: Take 1 tablet (10 mg total) by mouth at bedtime.    Dispense:  30 tablet    Refill:  3  . albuterol (PROVENTIL HFA;VENTOLIN HFA) 108 (90 Base) MCG/ACT  inhaler    Sig: Inhale 2 puffs into the lungs every 6 (six) hours as needed for wheezing or shortness of breath.    Dispense:  1 Inhaler    Refill:  2  . guaiFENesin-codeine (ROBITUSSIN AC) 100-10 MG/5ML syrup    Sig: Take 5 mLs by mouth 3 (three) times daily as needed for cough.    Dispense:  150 mL    Refill:  0      Dr. Otilio Miu [facility]  05/30/17

## 2017-05-30 NOTE — Patient Instructions (Signed)
How to Use a Metered Dose Inhaler A metered dose inhaler is a handheld device for taking medicine that must be breathed into the lungs (inhaled). The device can be used to deliver a variety of inhaled medicines, including:  Quick relief or rescue medicines, such as bronchodilators.  Controller medicines, such as corticosteroids.  The medicine is delivered by pushing down on a metal canister to release a preset amount of spray and medicine. Each device contains the amount of medicine that is needed for a preset number of uses (inhalations). Your health care provider may recommend that you use a spacer with your inhaler to help you take the medicine more effectively. A spacer is a plastic tube with a mouthpiece on one end and an opening that connects to the inhaler on the other end. A spacer holds the medicine in a tube for a short time, which allows you to inhale more medicine. What are the risks? If you do not use your inhaler correctly, medicine might not reach your lungs to help you breathe. Inhaler medicine can cause side effects, such as:  Mouth or throat infection.  Cough.  Hoarseness.  Headache.  Nausea and vomiting.  Lung infection (pneumonia) in people who have a lung condition called COPD.  How to use a metered dose inhaler without a spacer 1. Remove the cap from the inhaler. 2. If you are using the inhaler for the first time, shake it for 5 seconds, turn it away from your face, then release 4 puffs into the air. This is called priming. 3. Shake the inhaler for 5 seconds. 4. Position the inhaler so the top of the canister faces up. 5. Put your index finger on the top of the medicine canister. Support the bottom of the inhaler with your thumb. 6. Breathe out normally and as completely as possible, away from the inhaler. 7. Either place the inhaler between your teeth and close your lips tightly around the mouthpiece, or hold the inhaler 1-2 inches (2.5-5 cm) away from your open  mouth. Keep your tongue down out of the way. If you are unsure which technique to use, ask your health care provider. 8. Press the canister down with your index finger to release the medicine, then inhale deeply and slowly through your mouth (not your nose) until your lungs are completely filled. Inhaling should take 4-6 seconds. 9. Hold the medicine in your lungs for 5-10 seconds (10 seconds is best). This helps the medicine get into the small airways of your lungs. 10. With your lips in a tight circle (pursed), breathe out slowly. 11. Repeat steps 3-10 until you have taken the number of puffs that your health care provider directed. Wait about 1 minute between puffs or as directed. 12. Put the cap on the inhaler. 13. If you are using a steroid inhaler, rinse your mouth with water, gargle, and spit out the water. Do not swallow the water. How to use a metered dose inhaler with a spacer 1. Remove the cap from the inhaler. 2. If you are using the inhaler for the first time, shake it for 5 seconds, turn it away from your face, then release 4 puffs into the air. This is called priming. 3. Shake the inhaler for 5 seconds. 4. Place the open end of the spacer onto the inhaler mouthpiece. 5. Position the inhaler so the top of the canister faces up and the spacer mouthpiece faces you. 6. Put your index finger on the top of the medicine canister.   Support the bottom of the inhaler and the spacer with your thumb. 7. Breathe out normally and as completely as possible, away from the spacer. 8. Place the spacer between your teeth and close your lips tightly around it. Keep your tongue down out of the way. 9. Press the canister down with your index finger to release the medicine, then inhale deeply and slowly through your mouth (not your nose) until your lungs are completely filled. Inhaling should take 4-6 seconds. 10. Hold the medicine in your lungs for 5-10 seconds (10 seconds is best). This helps the medicine  get into the small airways of your lungs. 11. With your lips in a tight circle (pursed), breathe out slowly. 12. Repeat steps 3-11 until you have taken the number of puffs that your health care provider directed. Wait about 1 minute between puffs or as directed. 13. Remove the spacer from the inhaler and put the cap on the inhaler. 14. If you are using a steroid inhaler, rinse your mouth with water, gargle, and spit out the water. Do not swallow the water. Follow these instructions at home:  Take your inhaled medicine only as told by your health care provider. Do not use the inhaler more than directed by your health care provider.  Keep all follow-up visits as told by your health care provider. This is important.  If your inhaler has a counter, you can check it to determine how full your inhaler is. If your inhaler does not have a counter, ask your health care provider when you will need to refill your inhaler and write the refill date on a calendar or on your inhaler canister. Note that you cannot know when an inhaler is empty by shaking it.  Follow directions on the package insert for care and cleaning of your inhaler and spacer. Contact a health care provider if:  Symptoms are only partially relieved with your inhaler.  You are having trouble using your inhaler.  You have an increase in phlegm.  You have headaches. Get help right away if:  You feel little or no relief after using your inhaler.  You have dizziness.  You have a fast heart rate.  You have chills or a fever.  You have night sweats.  There is blood in your phlegm. Summary  A metered dose inhaler is a handheld device for taking medicine that must be breathed into the lungs (inhaled).  The medicine is delivered by pushing down on a metal canister to release a preset amount of spray and medicine.  Each device contains the amount of medicine that is needed for a preset number of uses (inhalations). This  information is not intended to replace advice given to you by your health care provider. Make sure you discuss any questions you have with your health care provider. Document Released: 01/25/2005 Document Revised: 12/16/2015 Document Reviewed: 12/16/2015 Elsevier Interactive Patient Education  2017 [employer]  

## 2017-07-28 ENCOUNTER — Encounter: Payer: Self-pay | Admitting: Family Medicine

## 2017-07-28 ENCOUNTER — Ambulatory Visit (INDEPENDENT_AMBULATORY_CARE_PROVIDER_SITE_OTHER): Payer: 59 | Admitting: Family Medicine

## 2017-07-28 VITALS — BP 120/70 | HR 76 | Ht 65.0 in | Wt 199.0 lb

## 2017-07-28 DIAGNOSIS — M5116 Intervertebral disc disorders with radiculopathy, lumbar region: Secondary | ICD-10-CM | POA: Diagnosis not present

## 2017-07-28 MED ORDER — PREDNISONE 10 MG PO TABS: 10.0000 mg | ORAL_TABLET | Freq: Every day | ORAL | 0 refills | Status: DC

## 2017-07-28 MED ORDER — CYCLOBENZAPRINE HCL 10 MG PO TABS: 10.0000 mg | ORAL_TABLET | Freq: Three times a day (TID) | ORAL | 0 refills | Status: DC | PRN

## 2017-07-28 MED ORDER — MELOXICAM 15 MG PO TABS: 15.0000 mg | ORAL_TABLET | Freq: Every day | ORAL | 0 refills | Status: DC

## 2017-07-28 NOTE — Progress Notes (Signed)
Name: Jill Walsh   MRN: [id number]    DOB: 05/22/1970   Date:07/28/2017       Progress Note  Subjective  Chief Complaint  Chief Complaint  Patient presents with  . Back Pain    started hurting in lower/ mid back last night after "pulling double shifts and lifting too much"    Back Pain  This is a new problem. The current episode started yesterday. The problem occurs constantly. The problem has been gradually worsening since onset. The pain is present in the lumbar spine. The quality of the pain is described as aching (sharp). The pain does not radiate. The pain is at a severity of 9/10. The pain is moderate. The symptoms are aggravated by bending, twisting and sitting. Pertinent negatives include no abdominal pain, bladder incontinence, bowel incontinence, chest pain, dysuria, fever, headaches, leg pain, numbness, paresis, paresthesias, pelvic pain, perianal numbness, tingling, weakness or weight loss. She has tried analgesics and NSAIDs (tylenol) for the symptoms. The treatment provided mild relief.    No problem-specific Assessment & Plan notes found for this encounter.   Past Medical History:  Diagnosis Date  . Breast cancer ([place]) 2016   RT MASTECTOMY    Past Surgical History:  Procedure Laterality Date  . BREAST BIOPSY Left    02/28/14 negative  . BREAST BIOPSY Right     02/2014 malignant  . MASTECTOMY Right 2016   BREAST CA  . TUBAL LIGATION      Family History  Problem Relation Age of Onset  . Cancer Mother   . Breast cancer Mother 72  . Stroke Brother   . Cancer Maternal Aunt     Social History   Socioeconomic History  . Marital status: Single    Spouse name: Not on file  . Number of children: Not on file  . Years of education: Not on file  . Highest education level: Not on file  Occupational History  . Not on file  Social Needs  . Financial resource strain: Not on file  . Food insecurity:    Worry: Not on file    Inability: Not on file  .  Transportation needs:    Medical: Not on file    Non-medical: Not on file  Tobacco Use  . Smoking status: Former Smoker    Years: 15.00  . Smokeless tobacco: Former Network engineer and Sexual Activity  . Alcohol use: No  . Drug use: No  . Sexual activity: Not on file  Lifestyle  . Physical activity:    Days per week: Not on file    Minutes per session: Not on file  . Stress: Not on file  Relationships  . Social connections:    Talks on phone: Not on file    Gets together: Not on file    Attends religious service: Not on file    Active member of club or organization: Not on file    Attends meetings of clubs or organizations: Not on file    Relationship status: Not on file  . Intimate partner violence:    Fear of current or ex partner: Not on file    Emotionally abused: Not on file    Physically abused: Not on file    Forced sexual activity: Not on file  Other Topics Concern  . Not on file  Social History Narrative  . Not on file    No Known Allergies  Outpatient Medications Prior to Visit  Medication Sig Dispense Refill  .  acetaminophen (TYLENOL) 325 MG tablet Take 650 mg by mouth every 6 (six) hours as needed.    Marland Kitchen albuterol (PROVENTIL HFA;VENTOLIN HFA) 108 (90 Base) MCG/ACT inhaler Inhale 2 puffs into the lungs every 6 (six) hours as needed for wheezing or shortness of breath. 1 Inhaler 0  . albuterol (PROVENTIL HFA;VENTOLIN HFA) 108 (90 Base) MCG/ACT inhaler Inhale 2 puffs into the lungs every 6 (six) hours as needed for wheezing or shortness of breath. 1 Inhaler 2  . montelukast (SINGULAIR) 10 MG tablet Take 1 tablet (10 mg total) by mouth at bedtime. 30 tablet 3  . guaiFENesin-codeine (ROBITUSSIN AC) 100-10 MG/5ML syrup Take 5 mLs by mouth 3 (three) times daily as needed for cough. 150 mL 0   Facility-Administered Medications Prior to Visit  Medication Dose Route Frequency Provider Last Rate Last Dose  . albuterol (PROVENTIL) (2.5 MG/3ML) 0.083% nebulizer solution  2.5 mg  2.5 mg Nebulization Once Juline Patch, MD        Review of Systems  Constitutional: Negative for chills, fever, malaise/fatigue and weight loss.  HENT: Negative for ear discharge, ear pain and sore throat.   Eyes: Negative for blurred vision.  Respiratory: Negative for cough, sputum production, shortness of breath and wheezing.   Cardiovascular: Negative for chest pain, palpitations and leg swelling.  Gastrointestinal: Negative for abdominal pain, blood in stool, bowel incontinence, constipation, diarrhea, heartburn, melena and nausea.  Genitourinary: Negative for bladder incontinence, dysuria, frequency, hematuria, pelvic pain and urgency.  Musculoskeletal: Positive for back pain. Negative for joint pain, myalgias and neck pain.  Skin: Negative for rash.  Neurological: Negative for dizziness, tingling, sensory change, focal weakness, weakness, numbness, headaches and paresthesias.  Endo/Heme/Allergies: Negative for environmental allergies and polydipsia. Does not bruise/bleed easily.  Psychiatric/Behavioral: Negative for depression and suicidal ideas. The patient is not nervous/anxious and does not have insomnia.      Objective  Vitals:   07/28/17 1613  BP: 120/70  Pulse: 76  Weight: 199 lb (90.3 kg)  Height: 5\' 5"  (1.651 m)    Physical Exam  Constitutional: She is oriented to person, place, and time. She appears well-developed and well-nourished.  HENT:  Head: Normocephalic.  Right Ear: External ear normal.  Left Ear: External ear normal.  Mouth/Throat: Oropharynx is clear and moist.  Eyes: Pupils are equal, round, and reactive to light. Conjunctivae and EOM are normal. Lids are everted and swept, no foreign bodies found. Left eye exhibits no hordeolum. No foreign body present in the left eye. Right conjunctiva is not injected. Left conjunctiva is not injected. No scleral icterus.  Neck: Normal range of motion. Neck supple. No JVD present. No tracheal deviation  present. No thyromegaly present.  Cardiovascular: Normal rate, regular rhythm, normal heart sounds and intact distal pulses. Exam reveals no gallop and no friction rub.  No murmur heard. Pulmonary/Chest: Effort normal and breath sounds normal. No respiratory distress. She has no wheezes. She has no rales.  Abdominal: Soft. Bowel sounds are normal. She exhibits no mass. There is no hepatosplenomegaly. There is no tenderness. There is no rebound and no guarding.  Musculoskeletal: Normal range of motion. She exhibits no edema or tenderness.  Lymphadenopathy:    She has no cervical adenopathy.  Neurological: She is alert and oriented to person, place, and time. She has normal strength. She displays normal reflexes. No cranial nerve deficit.  Skin: Skin is warm. No rash noted.  Psychiatric: She has a normal mood and affect. Her mood appears not anxious. She  does not exhibit a depressed mood.  Nursing note and vitals reviewed.     Assessment & Plan  Problem List Items Addressed This Visit    None    Visit Diagnoses    Lumbar disc disease with radiculopathy    -  Primary   start flexeril/ meloxicam and prednisone   Relevant Medications   predniSONE (DELTASONE) 10 MG tablet   cyclobenzaprine (FLEXERIL) 10 MG tablet   meloxicam (MOBIC) 15 MG tablet      Meds ordered this encounter  Medications  . predniSONE (DELTASONE) 10 MG tablet    Sig: Take 1 tablet (10 mg total) by mouth daily with breakfast.    Dispense:  30 tablet    Refill:  0  . cyclobenzaprine (FLEXERIL) 10 MG tablet    Sig: Take 1 tablet (10 mg total) by mouth 3 (three) times daily as needed for muscle spasms.    Dispense:  30 tablet    Refill:  0  . meloxicam (MOBIC) 15 MG tablet    Sig: Take 1 tablet (15 mg total) by mouth daily.    Dispense:  30 tablet    Refill:  0      Dr. Anglia Blakley [facility]  07/28/17

## 2017-07-28 NOTE — Patient Instructions (Signed)
Herniated Disk A herniated disk is when a disk in your spine bulges out too far. There is a disk with a spongy center in between each pair of bones in the spine (vertebrae). These disks act as shock absorbers when you move. A herniated disk can cause pain and muscle weakness. This can happen anywhere in the back or neck. Follow these instructions at home: Medicines  Take over-the-counter and prescription medicines only as told by your doctor.  Do not drive or use heavy machinery while taking prescription pain medicine. Activity  Rest as told by your doctor.  After your rest period: ? Return to your normal activities. Slowly start exercising as told by your doctor. Ask what activities are safe for you. ? Use good posture. ? Avoid movements that cause pain. ? Do not lift anything that is heavier than 10 lb (4.5 kg) until your doctor says this is safe. ? Do not sit or stand for a long time without moving. ? Do not sit for a long time without getting up and moving around.  Do exercises (physical therapy) as told.  Try to strengthen your back and belly (abdomen) with exercises like crunches, swimming, or walking. General instructions  Do not use any products that contain nicotine or tobacco, such as cigarettes and e-cigarettes. If you need help quitting, ask your doctor.  Do not wear high-heeled shoes.  Do not sleep on your belly.  If you are overweight, work with your doctor to lose weight safely.  To prevent or treat constipation while you are taking prescription pain medicine, your doctor may recommend that you: ? Drink enough fluid to keep your pee (urine) clear or pale yellow. ? Take over-the-counter or prescription medicines. ? Eat foods that are high in fiber. These include fresh fruits and vegetables, whole grains, and beans. ? Limit foods that are high in fat and processed sugars. These include fried and sweet foods.  Keep all follow-up visits as told by your doctor. This  is important. How is this prevented?  Stay at a healthy weight.  Try to avoid stress.  Stay in shape. Do at least 150 minutes of moderate-intensity exercise each week, such as fast walking or water aerobics.  When lifting objects: ? Keep your feet as far apart as your shoulders (shoulder-width apart) or farther apart. ? Tighten your belly muscles. ? Bend your knees and hips and keep your spine neutral. Lift using the strength of your legs, not your back. Do not lock your knees straight out. ? Always ask for help to lift heavy or awkward objects. Contact a doctor if:  You have back pain or neck pain that does not get better after 6 weeks.  You have very bad pain.  You get any of these problems in any part of your body: ? Tingling. ? Weakness. ? Loss of feeling (numbness). Get help right away if:  You cannot move your arms or legs.  You cannot control when you pee (urinate) or poop (have a bowel movement).  You feel dizzy.  You faint.  You have trouble breathing. This information is not intended to replace advice given to you by your health care provider. Make sure you discuss any questions you have with your health care provider. Document Released: 06/11/2013 Document Revised: 09/24/2015 Document Reviewed: 07/24/2015 Elsevier Interactive Patient Education  2017 [employer]  

## 2017-08-18 ENCOUNTER — Ambulatory Visit: Payer: 59 | Admitting: Family Medicine

## 2017-08-18 ENCOUNTER — Encounter: Payer: Self-pay | Admitting: Family Medicine

## 2017-08-18 VITALS — BP 122/81 | HR 102 | Resp 16 | Ht 65.0 in | Wt 198.0 lb

## 2017-08-18 DIAGNOSIS — F329 Major depressive disorder, single episode, unspecified: Secondary | ICD-10-CM | POA: Diagnosis not present

## 2017-08-18 DIAGNOSIS — F411 Generalized anxiety disorder: Secondary | ICD-10-CM | POA: Diagnosis not present

## 2017-08-18 DIAGNOSIS — F410 Panic disorder [episodic paroxysmal anxiety] without agoraphobia: Secondary | ICD-10-CM | POA: Diagnosis not present

## 2017-08-18 MED ORDER — SERTRALINE HCL 50 MG PO TABS: 50.0000 mg | ORAL_TABLET | Freq: Every day | ORAL | 3 refills | Status: DC

## 2017-08-18 MED ORDER — ALPRAZOLAM 0.25 MG PO TABS
0.2500 mg | ORAL_TABLET | Freq: Two times a day (BID) | ORAL | 0 refills | Status: DC | PRN
Start: 2017-08-18 — End: 2017-12-23

## 2017-08-18 NOTE — Progress Notes (Signed)
Name: Jill Walsh   MRN: [id number]    DOB: [birth year]   Date:08/18/2017       Progress Note  Subjective  Chief Complaint  Chief Complaint  Patient presents with  . Depression    Son was attacked year ago and raped and now has tried to committ suicide yesterday and she can not see him now as he is in hospital but he snorted 8 of 16 lines of medications hoping to die. Patient very depressed having anxiety and needs medication.     Depression         This is a new problem.  The current episode started in the past 7 days.   The problem has been gradually worsening since onset.  Associated symptoms include fatigue, helplessness, hopelessness, insomnia, restlessness, decreased interest and sad.  Associated symptoms include no decreased concentration, not irritable, no appetite change, no body aches, no myalgias, no headaches and no suicidal ideas.     The symptoms are aggravated by family issues.  Past treatments include nothing.   No problem-specific Assessment & Plan notes found for this encounter.   Past Medical History:  Diagnosis Date  . Breast cancer ([place]) 2016   RT MASTECTOMY    Past Surgical History:  Procedure Laterality Date  . BREAST BIOPSY Left    02/28/14 negative  . BREAST BIOPSY Right     02/2014 malignant  . MASTECTOMY Right 2016   BREAST CA  . TUBAL LIGATION      Family History  Problem Relation Age of Onset  . Cancer Mother   . Breast cancer Mother 75  . Stroke Brother   . Cancer Maternal Aunt     Social History   Socioeconomic History  . Marital status: Single    Spouse name: Not on file  . Number of children: Not on file  . Years of education: Not on file  . Highest education level: Not on file  Occupational History  . Not on file  Social Needs  . Financial resource strain: Not on file  . Food insecurity:    Worry: Patient refused    Inability: Patient refused  . Transportation needs:    Medical: Patient refused    Non-medical: Patient  refused  Tobacco Use  . Smoking status: Former Smoker    Years: 15.00  . Smokeless tobacco: Former Network engineer and Sexual Activity  . Alcohol use: No  . Drug use: No  . Sexual activity: Not on file  Lifestyle  . Physical activity:    Days per week: Patient refused    Minutes per session: Patient refused  . Stress: Not on file  Relationships  . Social connections:    Talks on phone: Patient refused    Gets together: Patient refused    Attends religious service: Patient refused    Active member of club or organization: Patient refused    Attends meetings of clubs or organizations: Patient refused    Relationship status: Patient refused  . Intimate partner violence:    Fear of current or ex partner: Patient refused    Emotionally abused: Patient refused    Physically abused: Patient refused    Forced sexual activity: Patient refused  Other Topics Concern  . Not on file  Social History Narrative  . Not on file    No Known Allergies  Outpatient Medications Prior to Visit  Medication Sig Dispense Refill  . albuterol (PROVENTIL HFA;VENTOLIN HFA) 108 (90 Base) MCG/ACT inhaler Inhale 2 puffs  into the lungs every 6 (six) hours as needed for wheezing or shortness of breath. 1 Inhaler 0  . acetaminophen (TYLENOL) 325 MG tablet Take 650 mg by mouth every 6 (six) hours as needed.    Marland Kitchen albuterol (PROVENTIL HFA;VENTOLIN HFA) 108 (90 Base) MCG/ACT inhaler Inhale 2 puffs into the lungs every 6 (six) hours as needed for wheezing or shortness of breath. 1 Inhaler 2  . cyclobenzaprine (FLEXERIL) 10 MG tablet Take 1 tablet (10 mg total) by mouth 3 (three) times daily as needed for muscle spasms. 30 tablet 0  . meloxicam (MOBIC) 15 MG tablet Take 1 tablet (15 mg total) by mouth daily. 30 tablet 0  . montelukast (SINGULAIR) 10 MG tablet Take 1 tablet (10 mg total) by mouth at bedtime. 30 tablet 3  . predniSONE (DELTASONE) 10 MG tablet Take 1 tablet (10 mg total) by mouth daily with breakfast.  30 tablet 0  . albuterol (PROVENTIL) (2.5 MG/3ML) 0.083% nebulizer solution 2.5 mg      No facility-administered medications prior to visit.     Review of Systems  Constitutional: Positive for fatigue. Negative for appetite change, chills, fever, malaise/fatigue and weight loss.  HENT: Negative for ear discharge, ear pain and sore throat.   Eyes: Negative for blurred vision.  Respiratory: Negative for cough, sputum production, shortness of breath and wheezing.   Cardiovascular: Negative for chest pain, palpitations and leg swelling.  Gastrointestinal: Negative for abdominal pain, blood in stool, constipation, diarrhea, heartburn, melena and nausea.  Genitourinary: Negative for dysuria, frequency, hematuria and urgency.  Musculoskeletal: Negative for back pain, joint pain, myalgias and neck pain.  Skin: Negative for rash.  Neurological: Negative for dizziness, tingling, sensory change, focal weakness and headaches.  Endo/Heme/Allergies: Negative for environmental allergies and polydipsia. Does not bruise/bleed easily.  Psychiatric/Behavioral: Positive for depression. Negative for decreased concentration and suicidal ideas. The patient has insomnia. The patient is not nervous/anxious.      Objective  Vitals:   08/18/17 1557  BP: 122/81  Pulse: (!) 102  Resp: 16  SpO2: 98%  Weight: 198 lb (89.8 kg)  Height: 5\' 5"  (1.651 m)    Physical Exam  Constitutional: She is oriented to person, place, and time. She appears well-developed and well-nourished. She is not irritable.  HENT:  Head: Normocephalic.  Right Ear: External ear normal.  Left Ear: External ear normal.  Mouth/Throat: Oropharynx is clear and moist.  Eyes: Pupils are equal, round, and reactive to light. Conjunctivae and EOM are normal. Lids are everted and swept, no foreign bodies found. Left eye exhibits no hordeolum. No foreign body present in the left eye. Right conjunctiva is not injected. Left conjunctiva is not  injected. No scleral icterus.  Neck: Normal range of motion. Neck supple. No JVD present. No tracheal deviation present. No thyromegaly present.  Cardiovascular: Normal rate, regular rhythm, normal heart sounds and intact distal pulses. Exam reveals no gallop and no friction rub.  No murmur heard. Pulmonary/Chest: Effort normal and breath sounds normal. No respiratory distress. She has no wheezes. She has no rales.  Abdominal: Soft. Bowel sounds are normal. She exhibits no mass. There is no hepatosplenomegaly. There is no tenderness. There is no rebound and no guarding.  Musculoskeletal: Normal range of motion. She exhibits no edema or tenderness.  Lymphadenopathy:    She has no cervical adenopathy.  Neurological: She is alert and oriented to person, place, and time. She has normal strength. She displays normal reflexes. No cranial nerve deficit.  Skin:  Skin is warm. No rash noted.  Psychiatric: She has a normal mood and affect. Her mood appears not anxious. She does not exhibit a depressed mood.  Nursing note and vitals reviewed.     Assessment & Plan  Problem List Items Addressed This Visit    None    Visit Diagnoses    Reactive depression    -  Primary   Recent family stress reulting in reactive depression. Start taper of sertraline to baseline 50mg  daily. Recheck 2 weeks.   Relevant Medications   sertraline (ZOLOFT) 50 MG tablet   ALPRAZolam (XANAX) 0.25 MG tablet   Generalized anxiety disorder with panic attacks       Initiated on sertraline above with alprazolam prn panic episodes   Relevant Medications   sertraline (ZOLOFT) 50 MG tablet   ALPRAZolam (XANAX) 0.25 MG tablet      Meds ordered this encounter  Medications  . sertraline (ZOLOFT) 50 MG tablet    Sig: Take 1 tablet (50 mg total) by mouth daily.    Dispense:  30 tablet    Refill:  3  . ALPRAZolam (XANAX) 0.25 MG tablet    Sig: Take 1 tablet (0.25 mg total) by mouth 2 (two) times daily as needed for anxiety.     Dispense:  20 tablet    Refill:  0      Dr. Deanna Jones [facility]  08/18/17

## 2017-09-07 ENCOUNTER — Other Ambulatory Visit: Payer: Self-pay | Admitting: Family Medicine

## 2017-09-07 DIAGNOSIS — M5116 Intervertebral disc disorders with radiculopathy, lumbar region: Secondary | ICD-10-CM

## 2017-09-26 ENCOUNTER — Inpatient Hospital Stay: Payer: 59 | Attending: Hematology and Oncology

## 2017-09-26 ENCOUNTER — Inpatient Hospital Stay (HOSPITAL_BASED_OUTPATIENT_CLINIC_OR_DEPARTMENT_OTHER): Payer: 59 | Admitting: Hematology and Oncology

## 2017-09-26 ENCOUNTER — Encounter: Payer: Self-pay | Admitting: *Deleted

## 2017-09-26 ENCOUNTER — Encounter: Payer: Self-pay | Admitting: Hematology and Oncology

## 2017-09-26 VITALS — BP 156/92 | HR 106 | Temp 98.5°F | Resp 18 | Ht 65.0 in | Wt 199.7 lb

## 2017-09-26 DIAGNOSIS — C50911 Malignant neoplasm of unspecified site of right female breast: Secondary | ICD-10-CM | POA: Insufficient documentation

## 2017-09-26 DIAGNOSIS — Z87891 Personal history of nicotine dependence: Secondary | ICD-10-CM | POA: Diagnosis not present

## 2017-09-26 DIAGNOSIS — Z9011 Acquired absence of right breast and nipple: Secondary | ICD-10-CM | POA: Insufficient documentation

## 2017-09-26 DIAGNOSIS — Z171 Estrogen receptor negative status [ER-]: Secondary | ICD-10-CM

## 2017-09-26 LAB — CBC WITH DIFFERENTIAL/PLATELET
Basophils Absolute: 0 10*3/uL (ref 0–0.1)
Basophils Relative: 0 %
Eosinophils Absolute: 0.1 10*3/uL (ref 0–0.7)
Eosinophils Relative: 1 %
HCT: 37.6 % (ref 35.0–47.0)
Hemoglobin: 12.6 g/dL (ref 12.0–16.0)
Lymphocytes Relative: 24 %
Lymphs Abs: 2.4 10*3/uL (ref 1.0–3.6)
MCH: 28.3 pg (ref 26.0–34.0)
MCHC: 33.5 g/dL (ref 32.0–36.0)
MCV: 84.5 fL (ref 80.0–100.0)
Monocytes Absolute: 0.6 10*3/uL (ref 0.2–0.9)
Monocytes Relative: 6 %
Neutro Abs: 7 10*3/uL — ABNORMAL HIGH (ref 1.4–6.5)
Neutrophils Relative %: 69 %
Platelets: 302 10*3/uL (ref 150–440)
RBC: 4.44 MIL/uL (ref 3.80–5.20)
RDW: 14.5 % (ref 11.5–14.5)
WBC: 10.2 10*3/uL (ref 3.6–11.0)

## 2017-09-26 LAB — COMPREHENSIVE METABOLIC PANEL
ALT: 26 U/L (ref 0–44)
AST: 19 U/L (ref 15–41)
Albumin: 4.1 g/dL (ref 3.5–5.0)
Alkaline Phosphatase: 93 U/L (ref 38–126)
Anion gap: 11 (ref 5–15)
BUN: 12 mg/dL (ref 6–20)
CO2: 24 mmol/L (ref 22–32)
Calcium: 9.5 mg/dL (ref 8.9–10.3)
Chloride: 104 mmol/L (ref 98–111)
Creatinine, Ser: 0.87 mg/dL (ref 0.44–1.00)
GFR calc Af Amer: >60 mL/min (ref >60)
GFR calc non Af Amer: >60 mL/min (ref >60)
Glucose, Bld: 101 mg/dL — ABNORMAL HIGH (ref 70–99)
Potassium: 3.7 mmol/L (ref 3.5–5.1)
Sodium: 139 mmol/L (ref 135–145)
Total Bilirubin: 0.4 mg/dL (ref 0.3–1.2)
Total Protein: 7.9 g/dL (ref 6.5–8.1)

## 2017-09-26 NOTE — Progress Notes (Signed)
[facility] day:  09/26/17   Chief Complaint: Jill Walsh is a 47 y.o. 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  HPI: The patient was last seen in the medical oncology clinic on 03/28/2017.  At that time, she complained of pressure contralateral to her mastectomy site that was caused by her intact breast "pulling". She was sore and tender over her right mastectomy site.  She had heavy menses.  Exam revealed fibrocystic changes in the LEFT breast.  Labs were unremarkable.   During the interim, patient has been under a lot of stress. She has frequent episodes of chest pain due to anxiety. Patient is now on anti-anxiety medications because of what is going on with her son. Patient has not had a menstrual cycle in over 2 months. She is having significant hot flashes. She denies any breast concerns.   Past Medical History:  Diagnosis Date  . Breast cancer ([place]) 2016   RT MASTECTOMY    Past Surgical History:  Procedure Laterality Date  . BREAST BIOPSY Left    02/28/14 negative  . BREAST BIOPSY Right     02/2014 malignant  . MASTECTOMY Right 2016   BREAST CA  . TUBAL LIGATION      Family History  Problem Relation Age of Onset  . Cancer Mother   . Breast cancer Mother 21  . Stroke Brother   . Cancer Maternal Aunt     Social History:  reports that she has quit smoking. She quit after 15.00 years of use. She has quit using smokeless tobacco. She reports that she does not drink alcohol or use drugs.  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  The patient is accompanied by her fiance, [name], today.  They are building a house together.  She is alone today.  Allergies: No Known Allergies  Current Medications: Current Outpatient Medications  Medication Sig Dispense Refill  . meloxicam (MOBIC) 15 MG tablet  TAKE 1 TABLET BY MOUTH EVERY DAY 30 tablet 0  . sertraline (ZOLOFT) 50 MG tablet Take 1 tablet (50 mg total) by mouth daily. 30 tablet 3  . albuterol (PROVENTIL HFA;VENTOLIN HFA) 108 (90 Base) MCG/ACT inhaler Inhale 2 puffs into the lungs every 6 (six) hours as needed for wheezing or shortness of breath. (Patient not taking: Reported on 09/26/2017) 1 Inhaler 0  . ALPRAZolam (XANAX) 0.25 MG tablet Take 1 tablet (0.25 mg total) by mouth 2 (two) times daily as needed for anxiety. (Patient not taking: Reported on 09/26/2017) 20 tablet 0   No current facility-administered medications for this visit.     Review of Systems:  GENERAL:  Feels "good".  No fevers, sweats or weight loss.  Weight up 1 pound. PERFORMANCE STATUS (ECOG):  0 HEENT:  No visual changes, runny nose, sore throat, mouth sores or tenderness. Lungs: No shortness of breath or cough.  No hemoptysis. Cardiac:  No chest pain, palpitations, orthopnea, or PND. GI:  No nausea, vomiting, diarrhea, constipation, melena or hematochezia. GU:  No urgency, frequency, dysuria, or hematuria. Musculoskeletal:  No back pain.  No joint pain.  No muscle tenderness. Extremities:  No pain or swelling. Skin:  No rashes or skin changes. Neuro:  No headache, numbness or weakness, balance or coordination issues. Endocrine:  No diabetes, thyroid issues.  Hot flashes.  No night sweats. Psych:  Anxiety attacks.  No depression.. Pain:  No focal pain. Review of systems:  All other systems reviewed and found to be negative.   Physical Exam: Blood pressure (!) 156/92, pulse (!) 106, temperature 98.5 F (36.9 C), temperature source Tympanic, resp. rate 18, height 5' 5"  (1.651 m), weight 199 lb 11.2 oz (90.6 kg), SpO2 98 %. GENERAL:  Well developed, well nourished, woman sitting comfortably in the exam room in no acute distress.  MENTAL STATUS:  Alert and oriented to person, place and time. HEAD: Curly brown hair.  Normocephalic, atraumatic, face symmetric, no  Cushingoid features. EYES:  Brown eyes.  Pupils equal round and reactive to light and accomodation.  No conjunctivitis or scleral icterus. ENT:  Oropharynx clear without lesion.  Tongue normal. Mucous membranes moist.  RESPIRATORY:  Clear to auscultation without rales, wheezes or rhonchi. CARDIOVASCULAR:  Regular rate and rhythm without murmur, rub or gallop. BREAST:  Right mastectomy.  No erythema, skin changes or nodularity.  Left breast with scattered fibrocystic changes. No masses, skin changes or nipple discharge. ABDOMEN:  Soft, non-tender, with active bowel sounds, and no hepatosplenomegaly.  No masses. SKIN:  No rashes, ulcers or lesions. EXTREMITIES: No edema, no skin discoloration or tenderness.  No palpable cords. LYMPH NODES: No palpable cervical, supraclavicular, axillary or inguinal adenopathy  NEUROLOGICAL: Unremarkable. PSYCH:  Appropriate.  She is tearful at times.   Appointment on 09/26/2017  Component Date Value Ref Range Status  . Sodium 09/26/2017 139  135 - 145 mmol/L Final  . Potassium 09/26/2017 3.7  3.5 - 5.1 mmol/L Final  . Chloride 09/26/2017 104  98 - 111 mmol/L Final  . CO2 09/26/2017 24  22 - 32 mmol/L Final  . Glucose, Bld 09/26/2017 101* 70 - 99 mg/dL Final  . BUN 09/26/2017 12  6 - 20 mg/dL Final  . Creatinine, Ser 09/26/2017 0.87  0.44 - 1.00 mg/dL Final  . Calcium 09/26/2017 9.5  8.9 - 10.3 mg/dL Final  . Total Protein 09/26/2017 7.9  6.5 - 8.1 g/dL Final  . Albumin 09/26/2017 4.1  3.5 - 5.0 g/dL Final  . AST 09/26/2017 19  15 - 41 U/L Final  . ALT 09/26/2017 26  0 - 44 U/L Final  . Alkaline Phosphatase 09/26/2017 93  38 - 126 U/L Final  . Total Bilirubin 09/26/2017 0.4  0.3 - 1.2 mg/dL Final  . GFR calc non Af Amer 09/26/2017 >60  >60 mL/min Final  . GFR calc Af Amer 09/26/2017 >60  >60 mL/min Final   Comment: (NOTE) The eGFR has been calculated using the CKD EPI equation. This calculation has not been validated in all clinical situations. eGFR's  persistently <60 mL/min signify possible Chronic Kidney Disease.   Georgiann Hahn gap 09/26/2017 11  5 - 15 Final   Performed at [facility], [address] [place], [place] [address]  . WBC 09/26/2017 10.2  3.6 - 11.0 K/uL Final  . RBC 09/26/2017 4.44  3.80 - 5.20 MIL/uL Final  . Hemoglobin 09/26/2017 12.6  12.0 - 16.0 g/dL Final  . HCT 09/26/2017 37.6  35.0 - 47.0 % Final  . MCV 09/26/2017 84.5  80.0 - 100.0 fL Final  . MCH 09/26/2017 28.3  26.0 - 34.0 pg Final  . MCHC 09/26/2017 33.5  32.0 - 36.0 g/dL Final  . RDW 09/26/2017 14.5  11.5 - 14.5 % Final  . Platelets 09/26/2017 302  150 - 440 K/uL Final  . Neutrophils Relative % 09/26/2017 69  % Final  .  Neutro Abs 09/26/2017 7.0* 1.4 - 6.5 K/uL Final  . Lymphocytes Relative 09/26/2017 24  % Final  . Lymphs Abs 09/26/2017 2.4  1.0 - 3.6 K/uL Final  . Monocytes Relative 09/26/2017 6  % Final  . Monocytes Absolute 09/26/2017 0.6  0.2 - 0.9 K/uL Final  . Eosinophils Relative 09/26/2017 1  % Final  . Eosinophils Absolute 09/26/2017 0.1  0 - 0.7 K/uL Final  . Basophils Relative 09/26/2017 0  % Final  . Basophils Absolute 09/26/2017 0.0  0 - 0.1 K/uL Final   Performed at [facility], [address] [place], [place] [address]    Assessment:  Alessandria Henken is a 47 y.o. 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  No lymph nodes were removed.  Tumor was ER/PR negative and Her2/neu 3+.  Pathologic stage was T8mNx.    Left breast mammogram and ultrasound on 12/06/2014 revealed no mammographic or sonographic evidence of malignancy in the left breast.  There was an incidental note of two less than 4 mm benign appearing cysts at the 9:30 o'clock 9 cm and 10 cm from the nipple.  Left sided screening mammogram on 12/09/2016 revealed no mammographic evidence of malignancy.  CA27.29 has been followed: 24.4 on 04/30/2014, 18.0 on 11/25/2014, 26.8 on 03/14/2015, 20.6 on 07/14/2015,  19.1 on 12/11/2015, 21.6 on 04/15/2016, 18.6 on 10/18/2016, 17.6 on 03/29/2017, and 16.8 on 09/26/2017.  MyRisk genetic testing revealed no clinically significant mutation.  She has a mother who developed breast cancer at age 569  A maternal aunt has colon cancer.    Symptomatically, she denies any breast concerns.  Exam is stable.  CA27.29 is normal.  Plan: 1.  Labs today: CBC with diff, CMP, CA27.29. 2.  Her2neu+ multi-focal microinvasive right breast cancer:  Clinically doing well.  Mammogram on 12/09/2017. 3.  RTC in 6 months for MD assessment and labs (CBC with diff, CMP, CA27.29).   BHonor Loh NP  09/26/2017, 11:59 AM   I saw and evaluated the patient, participating in the key portions of the service and reviewing pertinent diagnostic studies and records.  I reviewed the nurse practitioner's note and agree with the findings and the plan.  The assessment and plan were discussed with the patient.  A few questions were asked by the patient and answered.   MLequita Asal MD  09/26/2017 , 11:59 AM

## 2017-09-26 NOTE — Progress Notes (Signed)
No new changes noted today 

## 2017-09-27 LAB — CA 27.29 (SERIAL MONITOR): CA 27.29: 16.8 U/mL (ref 0.0–38.6)

## 2017-11-10 ENCOUNTER — Ambulatory Visit (INDEPENDENT_AMBULATORY_CARE_PROVIDER_SITE_OTHER): Payer: 59

## 2017-11-10 ENCOUNTER — Ambulatory Visit
Admission: EM | Admit: 2017-11-10 | Discharge: 2017-11-10 | Disposition: A | Discharge status: Home / Self Care | Payer: 59 | Attending: Family Medicine | Admitting: Family Medicine

## 2017-11-10 ENCOUNTER — Other Ambulatory Visit: Payer: Self-pay

## 2017-11-10 ENCOUNTER — Encounter: Payer: Self-pay | Admitting: Gynecology

## 2017-11-10 DIAGNOSIS — X500XXA Overexertion from strenuous movement or load, initial encounter: Secondary | ICD-10-CM | POA: Diagnosis not present

## 2017-11-10 DIAGNOSIS — S6992XA Unspecified injury of left wrist, hand and finger(s), initial encounter: Secondary | ICD-10-CM

## 2017-11-10 DIAGNOSIS — M25532 Pain in left wrist: Secondary | ICD-10-CM

## 2017-11-10 IMAGING — CR DG WRIST COMPLETE 3+V*L*
4 series · 4 of 4 positions shown · non-contrast
Comparison: None.

CLINICAL DATA: Wrist pain

EXAM:
LEFT WRIST - COMPLETE 3+ VIEW

[wrist pa]
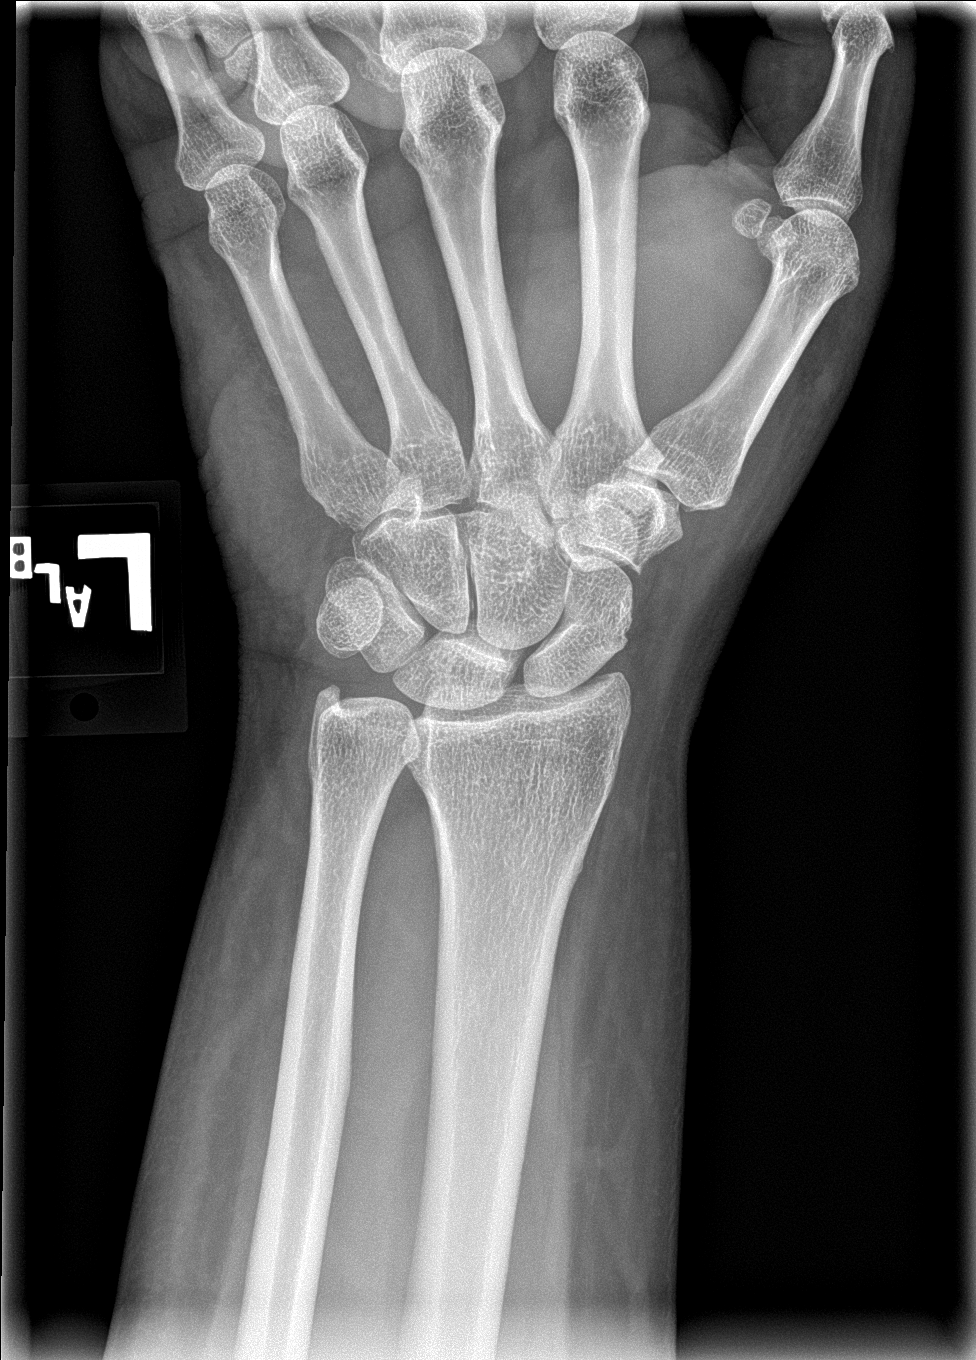

[wrist obl]
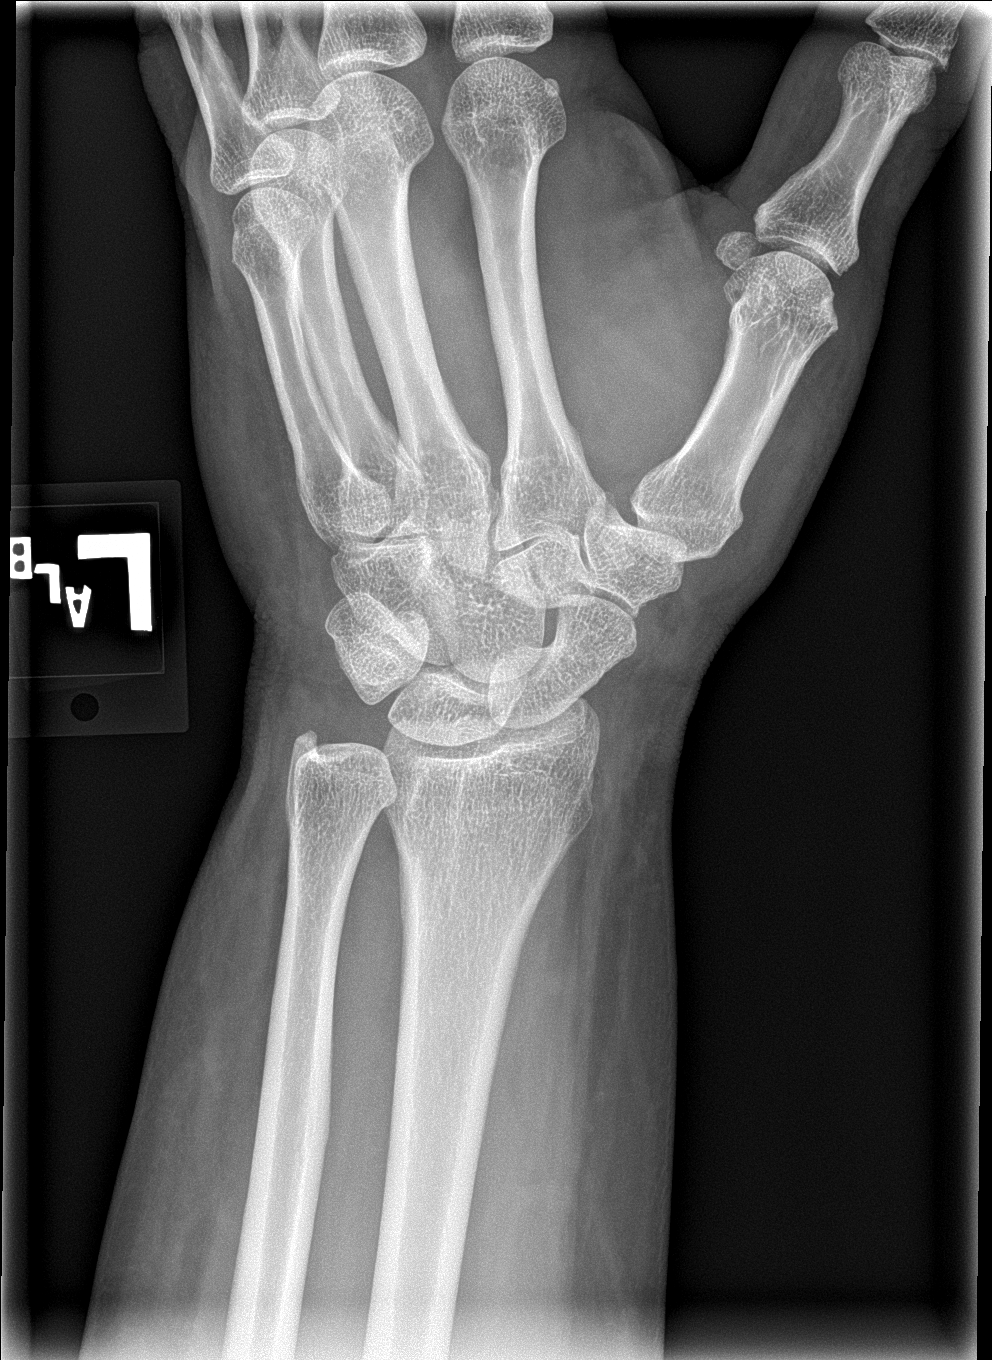

[wrist lat]
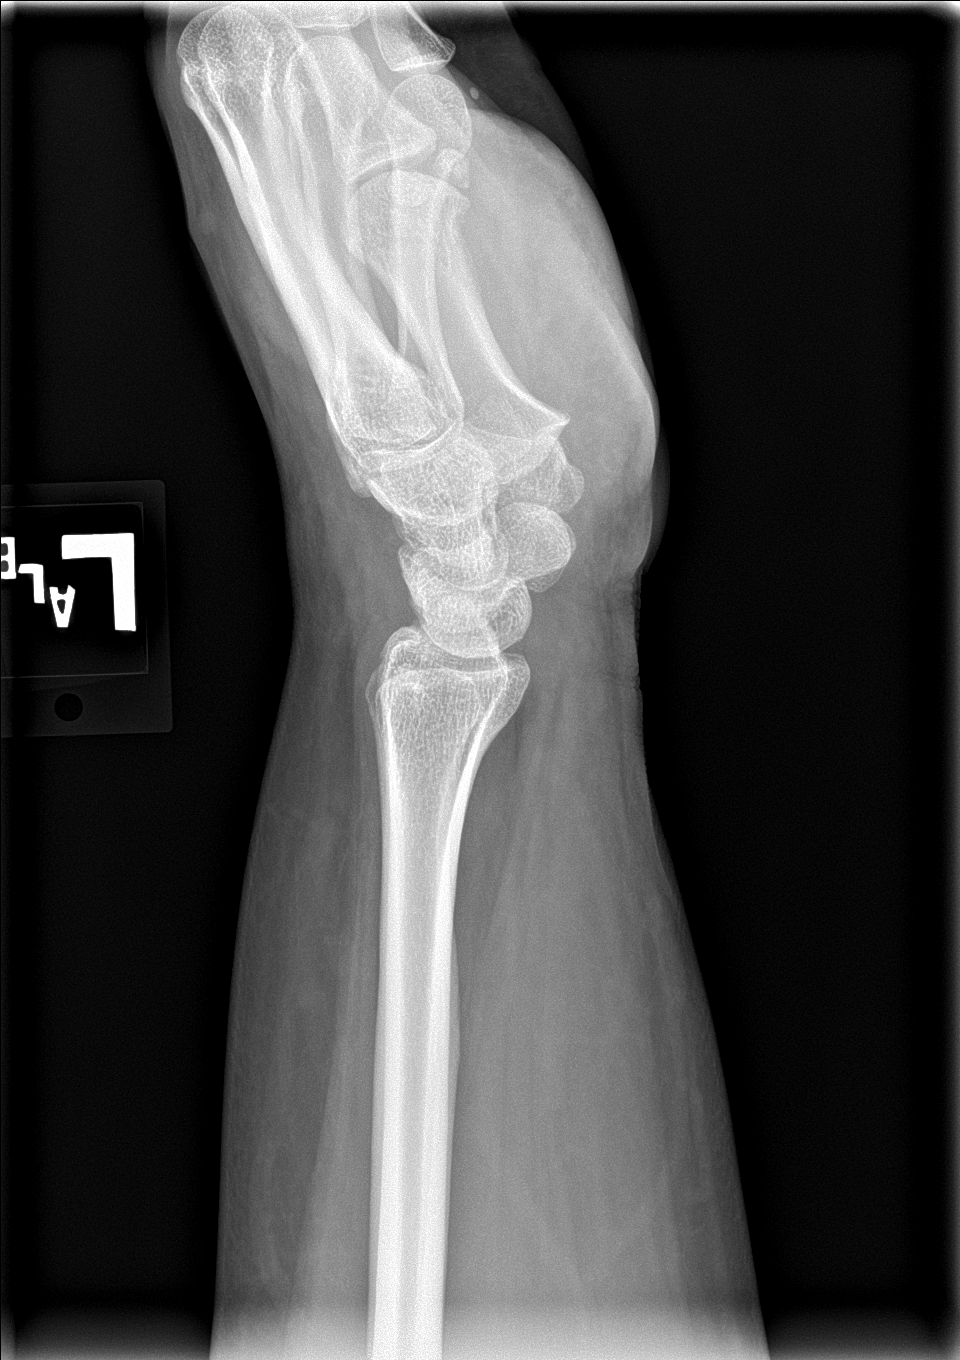

[wrist navicular]
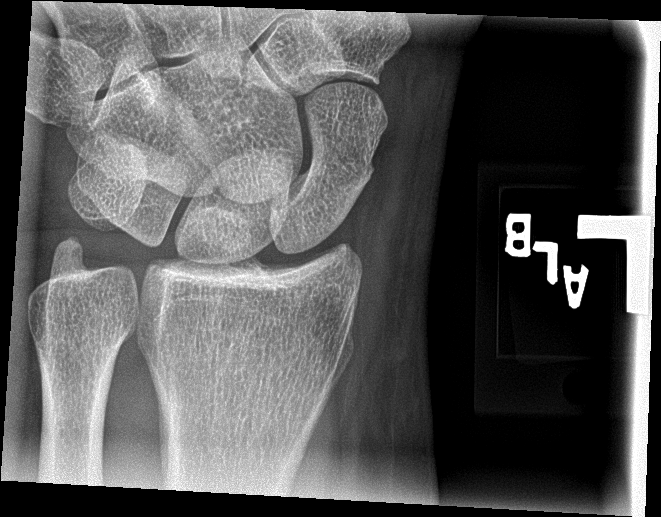

[4 of 4 positions shown; findings below may reference images not displayed]

FINDINGS: There is no evidence of fracture or dislocation. There is no
evidence of arthropathy or other focal bone abnormality. Soft
tissues are unremarkable.
IMPRESSION: Negative.

## 2017-11-10 MED ORDER — DICLOFENAC SODIUM 75 MG PO TBEC: 75.0000 mg | DELAYED_RELEASE_TABLET | Freq: Two times a day (BID) | ORAL | 0 refills | Status: DC | PRN

## 2017-11-10 NOTE — ED Triage Notes (Signed)
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 Per patient work at a [employer].

## 2017-11-10 NOTE — Discharge Instructions (Signed)
Rest, ice, elevation.  Medication as prescribed.  Take care  Dr. Steele Ledonne  

## 2017-11-10 NOTE — ED Provider Notes (Signed)
[facility]    CSN: [id number] Arrival date & time: 11/10/17  1831  History   Chief Complaint Chief Complaint  Patient presents with  . Wrist Injury   HPI  47 year old female presents with left wrist injury.  Patient was at work today.  She was lifting a heavy amount of candy.  She states that while doing so she injured her left wrist.  No reports of fall.  She reports swelling and pain.  No medications or interventions tried.  Worse with range of motion.  No relieving factors.  No other complaints.  PMH, Surgical Hx, Family Hx, Social History reviewed and updated as below.  Past Medical History:  Diagnosis Date  . Breast cancer ([place]) 2016   RT MASTECTOMY    Patient Active Problem List   Diagnosis Date Noted  . Simple chronic bronchitis ([place]) 05/30/2017  . Breast pain 12/20/2014  . Family history of breast cancer 05/28/2014  . Current smoker 05/28/2014  . Breast cancer ([place]) 05/28/2014  . Breast cancer, right breast ([place]) 03/28/2014   Past Surgical History:  Procedure Laterality Date  . BREAST BIOPSY Left    02/28/14 negative  . BREAST BIOPSY Right     02/2014 malignant  . MASTECTOMY Right 2016   BREAST CA  . TUBAL LIGATION     OB History   None    Home Medications    Prior to Admission medications   Medication Sig Start Date End Date Taking? Authorizing Provider  albuterol (PROVENTIL HFA;VENTOLIN HFA) 108 (90 Base) MCG/ACT inhaler Inhale 2 puffs into the lungs every 6 (six) hours as needed for wheezing or shortness of breath. 11/08/16  Yes Juline Patch, MD  ALPRAZolam Duanne Moron) 0.25 MG tablet Take 1 tablet (0.25 mg total) by mouth 2 (two) times daily as needed for anxiety. 08/18/17  Yes Juline Patch, MD  sertraline (ZOLOFT) 50 MG tablet Take 1 tablet (50 mg total) by mouth daily. 08/18/17  Yes Juline Patch, MD  diclofenac (VOLTAREN) 75 MG EC tablet Take 1 tablet (75 mg total) by mouth 2 (two) times daily as needed. 11/10/17   Coral Spikes, DO     Family History Family History  Problem Relation Age of Onset  . Cancer Mother   . Breast cancer Mother 20  . Stroke Brother   . Cancer Maternal Aunt     Social History Social History   Tobacco Use  . Smoking status: Former Smoker    Years: 15.00  . Smokeless tobacco: Former Network engineer Use Topics  . Alcohol use: No  . Drug use: No     Allergies   Patient has no known allergies.   Review of Systems Review of Systems  Constitutional: Negative.   Musculoskeletal:       Left wrist pain, swelling.    Physical Exam Triage Vital Signs ED Triage Vitals [11/10/17 1902]  Enc Vitals Group     BP 131/87     Pulse      Resp 16     Temp 98.2 F (36.8 C)     Temp Source Oral     SpO2 100 %     Weight 198 lb (89.8 kg)     Height 5\' 5"  (1.651 m)     Head Circumference      Peak Flow      Pain Score 10     Pain Loc      Pain Edu?      Excl.  in [place]?    Updated Vital Signs BP 131/87 (BP Location: Right Arm)   Temp 98.2 F (36.8 C) (Oral)   Resp 16   Ht 5\' 5"  (1.651 m)   Wt 89.8 kg   LMP 11/02/2017   SpO2 100%   BMI 32.95 kg/m   Visual Acuity Right Eye Distance:   Left Eye Distance:   Bilateral Distance:    Right Eye Near:   Left Eye Near:    Bilateral Near:     Physical Exam  Constitutional: She is oriented to person, place, and time. She appears well-developed. No distress.  HENT:  Head: Normocephalic and atraumatic.  Pulmonary/Chest: Effort normal. No respiratory distress.  Musculoskeletal:  Left wrist -patient with tenderness diffusely but most prominent on the ulnar aspect.  Mild swelling.  Decreased range of motion secondary to pain.  Neurological: She is alert and oriented to person, place, and time.  Psychiatric: She has a normal mood and affect. Her behavior is normal.  Nursing note and vitals reviewed.  UC Treatments / Results  Labs (all labs ordered are listed, but only abnormal results are displayed) Labs Reviewed - No data to  display  EKG None  Radiology Dg Wrist Complete Left  Result Date: 11/10/2017 CLINICAL DATA:  Wrist pain EXAM: LEFT WRIST - COMPLETE 3+ VIEW COMPARISON:  None. FINDINGS: There is no evidence of fracture or dislocation. There is no evidence of arthropathy or other focal bone abnormality. Soft tissues are unremarkable. IMPRESSION: Negative. Electronically Signed   By: Donavan Foil M.D.   On: 11/10/2017 19:34    Procedures Procedures (including critical care time)  Medications Ordered in UC Medications - No data to display  Initial Impression / Assessment and Plan / UC Course  I have reviewed the triage vital signs and the nursing notes.  Pertinent labs & imaging results that were available during my care of the patient were reviewed by me and considered in my medical decision making (see chart for details).    47 year old female presents with a left wrist injury.  X-ray negative.  Supportive care.  Rest, ice, elevation.  Diclofenac as directed.  Workmen's Comp. paper work filled out.  Patient excused from work tomorrow.  Final Clinical Impressions(s) / UC Diagnoses   Final diagnoses:  Left wrist pain     Discharge Instructions     Rest, ice, elevation.  Medication as prescribed.  Take care  Dr. Lacinda Axon    ED Prescriptions    Medication Sig Dispense Auth. Provider   diclofenac (VOLTAREN) 75 MG EC tablet Take 1 tablet (75 mg total) by mouth 2 (two) times daily as needed. 30 tablet Coral Spikes, DO     Controlled Substance Prescriptions [place] Controlled Substance Registry consulted? Not Applicable   Coral Spikes, DO 11/10/17 2047

## 2017-12-12 ENCOUNTER — Ambulatory Visit
Admission: RE | Admit: 2017-12-12 | Discharge: 2017-12-12 | Disposition: A | Discharge status: Home / Self Care | Payer: 59 | Source: Ambulatory Visit | Attending: Urgent Care | Admitting: Urgent Care

## 2017-12-12 DIAGNOSIS — Z171 Estrogen receptor negative status [ER-]: Secondary | ICD-10-CM | POA: Insufficient documentation

## 2017-12-12 DIAGNOSIS — Z1231 Encounter for screening mammogram for malignant neoplasm of breast: Secondary | ICD-10-CM | POA: Diagnosis not present

## 2017-12-12 DIAGNOSIS — C50911 Malignant neoplasm of unspecified site of right female breast: Secondary | ICD-10-CM | POA: Insufficient documentation

## 2017-12-21 ENCOUNTER — Other Ambulatory Visit: Payer: Self-pay

## 2017-12-21 ENCOUNTER — Ambulatory Visit
Admission: EM | Admit: 2017-12-21 | Discharge: 2017-12-21 | Disposition: A | Discharge status: Home / Self Care | Payer: 59 | Attending: Family Medicine | Admitting: Family Medicine

## 2017-12-21 ENCOUNTER — Ambulatory Visit (INDEPENDENT_AMBULATORY_CARE_PROVIDER_SITE_OTHER): Payer: 59

## 2017-12-21 ENCOUNTER — Encounter: Payer: Self-pay | Admitting: Emergency Medicine

## 2017-12-21 DIAGNOSIS — W010XXA Fall on same level from slipping, tripping and stumbling without subsequent striking against object, initial encounter: Secondary | ICD-10-CM

## 2017-12-21 DIAGNOSIS — M25562 Pain in left knee: Secondary | ICD-10-CM | POA: Diagnosis not present

## 2017-12-21 DIAGNOSIS — S83411A Sprain of medial collateral ligament of right knee, initial encounter: Secondary | ICD-10-CM | POA: Diagnosis not present

## 2017-12-21 DIAGNOSIS — S8992XA Unspecified injury of left lower leg, initial encounter: Secondary | ICD-10-CM | POA: Diagnosis not present

## 2017-12-21 DIAGNOSIS — S83412A Sprain of medial collateral ligament of left knee, initial encounter: Secondary | ICD-10-CM

## 2017-12-21 MED ORDER — HYDROCODONE-ACETAMINOPHEN 5-325 MG PO TABS: ORAL_TABLET | ORAL | 0 refills | Status: DC

## 2017-12-21 NOTE — ED Triage Notes (Signed)
Patient states that she was walking and states that she slipped and fell at work last night.  Patient c/o pain in her left knee.

## 2017-12-21 NOTE — ED Provider Notes (Signed)
[facility]    CSN: [id number] Arrival date & time: 12/21/17  1012     History   Chief Complaint Chief Complaint  Patient presents with  . Knee Pain    left    HPI Jill Walsh is a 47 y.o. female.   47 yo female with a c/o left knee pain after slipping and falling at work last night. States today knee is more swollen.   The history is provided by the patient.  Knee Pain    Past Medical History:  Diagnosis Date  . Breast cancer ([place]) 2016   RT MASTECTOMY    Patient Active Problem List   Diagnosis Date Noted  . Simple chronic bronchitis ([place]) 05/30/2017  . Breast pain 12/20/2014  . Family history of breast cancer 05/28/2014  . Current smoker 05/28/2014  . Breast cancer ([place]) 05/28/2014  . Breast cancer, right breast ([place]) 03/28/2014    Past Surgical History:  Procedure Laterality Date  . BREAST BIOPSY Left    02/28/14 negative  . BREAST BIOPSY Right     02/2014 malignant  . MASTECTOMY Right 2016   BREAST CA  . TUBAL LIGATION      OB History   None      Home Medications    Prior to Admission medications   Medication Sig Start Date End Date Taking? Authorizing Provider  albuterol (PROVENTIL HFA;VENTOLIN HFA) 108 (90 Base) MCG/ACT inhaler Inhale 2 puffs into the lungs every 6 (six) hours as needed for wheezing or shortness of breath. 11/08/16  Yes Juline Patch, MD  ALPRAZolam Duanne Moron) 0.25 MG tablet Take 1 tablet (0.25 mg total) by mouth 2 (two) times daily as needed for anxiety. 08/18/17   Juline Patch, MD  diclofenac (VOLTAREN) 75 MG EC tablet Take 1 tablet (75 mg total) by mouth 2 (two) times daily as needed. 11/10/17   Coral Spikes, DO  HYDROcodone-acetaminophen (NORCO/VICODIN) 5-325 MG tablet 1-2 tabs po q 8 hours prn 12/21/17   Norval Gable, MD  sertraline (ZOLOFT) 50 MG tablet Take 1 tablet (50 mg total) by mouth daily. 08/18/17   Juline Patch, MD    Family History Family History  Problem Relation Age of Onset  . Cancer  Mother   . Breast cancer Mother 37  . Stroke Brother   . Cancer Maternal Aunt     Social History Social History   Tobacco Use  . Smoking status: Former Smoker    Years: 15.00  . Smokeless tobacco: Former Network engineer Use Topics  . Alcohol use: No  . Drug use: No     Allergies   Patient has no known allergies.   Review of Systems Review of Systems   Physical Exam Triage Vital Signs ED Triage Vitals  Enc Vitals Group     BP 12/21/17 1029 127/87     Pulse Rate 12/21/17 1029 93     Resp 12/21/17 1029 16     Temp 12/21/17 1029 98.2 F (36.8 C)     Temp Source 12/21/17 1029 Oral     SpO2 12/21/17 1029 99 %     Weight 12/21/17 1026 194 lb (88 kg)     Height 12/21/17 1026 5\' 5"  (1.651 m)     Head Circumference --      Peak Flow --      Pain Score 12/21/17 1026 10     Pain Loc --      Pain Edu? --  Excl. in [place]? --    No data found.  Updated Vital Signs BP 127/87 (BP Location: Left Arm)   Pulse 93   Temp 98.2 F (36.8 C) (Oral)   Resp 16   Ht 5\' 5"  (1.651 m)   Wt 88 kg   LMP 12/10/2017 (Exact Date)   SpO2 99%   BMI 32.28 kg/m   Visual Acuity Right Eye Distance:   Left Eye Distance:   Bilateral Distance:    Right Eye Near:   Left Eye Near:    Bilateral Near:     Physical Exam  Constitutional: She appears well-developed and well-nourished. No distress.  Musculoskeletal:       Left knee: She exhibits decreased range of motion and swelling. She exhibits no effusion, no ecchymosis, no deformity, no laceration, no erythema, normal alignment, no LCL laxity, normal patellar mobility, no bony tenderness, normal meniscus and no MCL laxity. Tenderness found. MCL tenderness noted.  Skin: She is not diaphoretic.  Nursing note and vitals reviewed.    UC Treatments / Results  Labs (all labs ordered are listed, but only abnormal results are displayed) Labs Reviewed - No data to display  EKG None  Radiology Dg Knee Complete 4 Views Left  Result  Date: 12/21/2017 CLINICAL DATA:  Pt slipped last pm injuring left knee. Did a split and left knee twisted back. Most pain medial side of knee joint area EXAM: LEFT KNEE - COMPLETE 4+ VIEW COMPARISON:  None. FINDINGS: No fracture.  No bone lesion. Minimal tricompartmental marginal osteophytes with spurring from the tibial spine. No other arthropathic change. No joint effusion. Soft tissues are unremarkable. IMPRESSION: 1. No fracture or acute finding. 2. Minimal osteoarthritis. Electronically Signed   By: Lajean Manes M.D.   On: 12/21/2017 11:14    Procedures Procedures (including critical care time)  Medications Ordered in UC Medications - No data to display  Initial Impression / Assessment and Plan / UC Course  I have reviewed the triage vital signs and the nursing notes.  Pertinent labs & imaging results that were available during my care of the patient were reviewed by me and considered in my medical decision making (see chart for details).      Final Clinical Impressions(s) / UC Diagnoses   Final diagnoses:  Sprain of medial collateral ligament of left knee, initial encounter     Discharge Instructions     Rest, ice, elevate leg Over the counter advil/motrin or aleve    ED Prescriptions    Medication Sig Dispense Auth. Provider   HYDROcodone-acetaminophen (NORCO/VICODIN) 5-325 MG tablet 1-2 tabs po q 8 hours prn 10 tablet Sharrieff Spratlin, MD     1. x-ray results and diagnosis reviewed with patient 2. rx as per orders above; reviewed possible side effects, interactions, risks and benefits  3. Recommend supportive treatment as above 4. Knee brace given  5. Follow-up prn if symptoms worsen or don't improve    Controlled Substance Prescriptions [place] Controlled Substance Registry consulted? Not Applicable   Norval Gable, MD 12/21/17 [phone]

## 2017-12-21 NOTE — Discharge Instructions (Signed)
Rest, ice, elevate leg Over the counter advil/motrin or aleve

## 2017-12-23 ENCOUNTER — Other Ambulatory Visit: Payer: Self-pay

## 2017-12-23 ENCOUNTER — Ambulatory Visit
Admission: EM | Admit: 2017-12-23 | Discharge: 2017-12-23 | Disposition: A | Discharge status: Home / Self Care | Payer: 59 | Attending: Family Medicine | Admitting: Family Medicine

## 2017-12-23 ENCOUNTER — Encounter: Payer: Self-pay | Admitting: Emergency Medicine

## 2017-12-23 DIAGNOSIS — S83412D Sprain of medial collateral ligament of left knee, subsequent encounter: Secondary | ICD-10-CM | POA: Diagnosis not present

## 2017-12-23 DIAGNOSIS — M25562 Pain in left knee: Secondary | ICD-10-CM

## 2017-12-23 MED ORDER — MELOXICAM 15 MG PO TABS: 15.0000 mg | ORAL_TABLET | Freq: Every day | ORAL | 0 refills | Status: DC | PRN

## 2017-12-23 NOTE — ED Triage Notes (Signed)
Pt was seen on 12/21/17 for left knee pain (MCL sprain) she had a fall at work. She is back today because she is still in pain. She has been wearing the brace as directed. She states that the pain is worse with walking and standing. She was suppose to go back to work today but she was hurting too bad to go back.

## 2017-12-23 NOTE — ED Provider Notes (Signed)
[facility]    CSN: [id number] Arrival date & time: 12/23/17  1014  History   Chief Complaint Chief Complaint  Patient presents with  . Knee Pain    left   HPI  47 year old female presents with left knee pain.  Patient recently seen on 11/13.  Patient had injury at work.  She was evaluated here and diagnosed with an MCL sprain.  She was placed in a knee brace.  She has been taking pain medication and ibuprofen without resolution.  Continues to have pain.  Worse with activity.  No relieving factors. Does not feel like she is able to go back to work.  No other complaints or concerns at this time  PMH, Surgical Hx, Family Hx, Social History reviewed and updated as below.  Past Medical History:  Diagnosis Date  . Breast cancer ([place]) 2016   RT MASTECTOMY   Patient Active Problem List   Diagnosis Date Noted  . Simple chronic bronchitis ([place]) 05/30/2017  . Breast pain 12/20/2014  . Family history of breast cancer 05/28/2014  . Current smoker 05/28/2014  . Breast cancer ([place]) 05/28/2014  . Breast cancer, right breast ([place]) 03/28/2014    Past Surgical History:  Procedure Laterality Date  . BREAST BIOPSY Left    02/28/14 negative  . BREAST BIOPSY Right     02/2014 malignant  . MASTECTOMY Right 2016   BREAST CA  . TUBAL LIGATION      OB History   None      Home Medications    Prior to Admission medications   Medication Sig Start Date End Date Taking? Authorizing Provider  albuterol (PROVENTIL HFA;VENTOLIN HFA) 108 (90 Base) MCG/ACT inhaler Inhale 2 puffs into the lungs every 6 (six) hours as needed for wheezing or shortness of breath. 11/08/16  Yes Juline Patch, MD  HYDROcodone-acetaminophen (NORCO/VICODIN) 5-325 MG tablet 1-2 tabs po q 8 hours prn 12/21/17  Yes Conty, Linward Foster, MD  meloxicam (MOBIC) 15 MG tablet Take 1 tablet (15 mg total) by mouth daily as needed. 12/23/17   Coral Spikes, DO    Family History Family History  Problem Relation Age of  Onset  . Cancer Mother   . Breast cancer Mother 74  . Stroke Brother   . Cancer Maternal Aunt     Social History Social History   Tobacco Use  . Smoking status: Former Smoker    Years: 15.00  . Smokeless tobacco: Former Network engineer Use Topics  . Alcohol use: No  . Drug use: No     Allergies   Patient has no known allergies.   Review of Systems Review of Systems  Constitutional: Negative.   Musculoskeletal:       Left knee pain.   Physical Exam Triage Vital Signs ED Triage Vitals  Enc Vitals Group     BP 12/23/17 1024 127/74     Pulse Rate 12/23/17 1024 77     Resp 12/23/17 1024 18     Temp 12/23/17 1024 98.2 F (36.8 C)     Temp Source 12/23/17 1024 Oral     SpO2 12/23/17 1024 100 %     Weight 12/23/17 1022 194 lb (88 kg)     Height 12/23/17 1022 5\' 5"  (1.651 m)     Head Circumference --      Peak Flow --      Pain Score 12/23/17 1022 9     Pain Loc --      Pain  Edu? --      Excl. in [place]? --    Updated Vital Signs BP 127/74 (BP Location: Left Arm)   Pulse 77   Temp 98.2 F (36.8 C) (Oral)   Resp 18   Ht 5\' 5"  (1.651 m)   Wt 88 kg   LMP 12/10/2017 (Exact Date)   SpO2 100%   BMI 32.28 kg/m   Visual Acuity Right Eye Distance:   Left Eye Distance:   Bilateral Distance:    Right Eye Near:   Left Eye Near:    Bilateral Near:     Physical Exam  Constitutional: She is oriented to person, place, and time. She appears well-developed. No distress.  HENT:  Head: Normocephalic and atraumatic.  Pulmonary/Chest: Effort normal. No respiratory distress.  Musculoskeletal:  Left knee with exquisite tenderness to palpation medially as well as medial joint line.  Neurological: She is alert and oriented to person, place, and time.  Psychiatric: She has a normal mood and affect. Her behavior is normal.  Nursing note and vitals reviewed.  UC Treatments / Results  Labs (all labs ordered are listed, but only abnormal results are displayed) Labs Reviewed  - No data to display  EKG None  Radiology No results found.  Procedures Procedures (including critical care time)  Medications Ordered in UC Medications - No data to display  Initial Impression / Assessment and Plan / UC Course  I have reviewed the triage vital signs and the nursing notes.  Pertinent labs & imaging results that were available during my care of the patient were reviewed by me and considered in my medical decision making (see chart for details).    47 year old female presents with persistent and severe left knee pain.  Continue use of brace.  Stop Motrin.  Start meloxicam.  Continue pain medication as previously directed.  Rest, ice, elevate.  If does not improve will need to see orthopedics.  May need MRI.  Final Clinical Impressions(s) / UC Diagnoses   Final diagnoses:  Acute pain of left knee     Discharge Instructions     If persists, see [facility].  Mobic instead of Ibuprofen.  Pain medication as directed.  Take care  Dr. Lacinda Axon    ED Prescriptions    Medication Sig Dispense Auth. Provider   meloxicam (MOBIC) 15 MG tablet Take 1 tablet (15 mg total) by mouth daily as needed. 30 tablet Coral Spikes, DO     Controlled Substance Prescriptions [place] Controlled Substance Registry consulted? Not Applicable   Coral Spikes, DO 12/23/17 1231

## 2017-12-23 NOTE — Discharge Instructions (Signed)
If persists, see [facility].  Mobic instead of Ibuprofen.  Pain medication as directed.  Take care  Dr. Lacinda Axon

## 2018-02-09 ENCOUNTER — Telehealth: Payer: Self-pay | Admitting: *Deleted

## 2018-02-27 ENCOUNTER — Ambulatory Visit: Payer: 59 | Admitting: Family Medicine

## 2018-02-27 ENCOUNTER — Encounter: Payer: Self-pay | Admitting: Family Medicine

## 2018-02-27 VITALS — BP 130/80 | HR 60 | Ht 65.0 in | Wt 209.0 lb

## 2018-02-27 DIAGNOSIS — L739 Follicular disorder, unspecified: Secondary | ICD-10-CM | POA: Diagnosis not present

## 2018-02-27 MED ORDER — MUPIROCIN 2 % EX OINT: 1.0000 "application " | TOPICAL_OINTMENT | Freq: Two times a day (BID) | CUTANEOUS | 0 refills | Status: DC

## 2018-02-27 MED ORDER — DOXYCYCLINE HYCLATE 100 MG PO TABS: 100.0000 mg | ORAL_TABLET | Freq: Two times a day (BID) | ORAL | 0 refills | Status: DC

## 2018-02-27 NOTE — Progress Notes (Signed)
Date:  02/27/2018   Name:  Jill Walsh   DOB:  Dec 18, 1970   MRN:  [id number]   Chief Complaint: underarm pain (Felt 2 knots under L) arm- x 2 weeks- tender/ bra hurts them)  Rash  This is a new problem. The current episode started in the past 7 days. The problem has been gradually worsening since onset. The affected locations include the left axilla. The rash is characterized by redness and swelling (tender). She was exposed to nothing. Pertinent negatives include no congestion, cough, diarrhea, eye pain, fatigue, fever, rhinorrhea, shortness of breath, sore throat or vomiting. Past treatments include nothing.    Review of Systems  Constitutional: Negative.  Negative for chills, fatigue, fever and unexpected weight change.  HENT: Negative for congestion, ear discharge, ear pain, rhinorrhea, sinus pressure, sneezing and sore throat.   Eyes: Negative for photophobia, pain, discharge, redness and itching.  Respiratory: Negative for cough, shortness of breath, wheezing and stridor.   Gastrointestinal: Negative for abdominal pain, blood in stool, constipation, diarrhea, nausea and vomiting.  Endocrine: Negative for cold intolerance, heat intolerance, polydipsia, polyphagia and polyuria.  Genitourinary: Negative for dysuria, flank pain, frequency, hematuria, menstrual problem, pelvic pain, urgency, vaginal bleeding and vaginal discharge.  Musculoskeletal: Negative for arthralgias, back pain and myalgias.  Skin: Positive for rash.  Allergic/Immunologic: Negative for environmental allergies and food allergies.  Neurological: Negative for dizziness, weakness, light-headedness, numbness and headaches.  Hematological: Negative for adenopathy. Does not bruise/bleed easily.  Psychiatric/Behavioral: Negative for dysphoric mood. The patient is not nervous/anxious.     Patient Active Problem List   Diagnosis Date Noted  . Simple chronic bronchitis ([place]) 05/30/2017  . Breast pain 12/20/2014  .  Family history of breast cancer 05/28/2014  . Current smoker 05/28/2014  . Breast cancer ([place]) 05/28/2014  . Breast cancer, right breast ([place]) 03/28/2014    No Known Allergies  Past Surgical History:  Procedure Laterality Date  . BREAST BIOPSY Left    02/28/14 negative  . BREAST BIOPSY Right     02/2014 malignant  . MASTECTOMY Right 2016   BREAST CA  . TUBAL LIGATION      Social History   Tobacco Use  . Smoking status: Former Smoker    Years: 15.00  . Smokeless tobacco: Former Network engineer Use Topics  . Alcohol use: No  . Drug use: No     Medication list has been reviewed and updated.  Current Meds  Medication Sig  . albuterol (PROVENTIL HFA;VENTOLIN HFA) 108 (90 Base) MCG/ACT inhaler Inhale 2 puffs into the lungs every 6 (six) hours as needed for wheezing or shortness of breath.  . meloxicam (MOBIC) 15 MG tablet Take 1 tablet (15 mg total) by mouth daily as needed.    PHQ 2/9 Scores 08/18/2017 11/08/2016  PHQ - 2 Score 6 0  PHQ- 9 Score 18 0    Physical Exam Vitals signs and nursing note reviewed.  Constitutional:      General: She is not in acute distress.    Appearance: She is not diaphoretic.  HENT:     Head: Normocephalic and atraumatic.     Right Ear: External ear normal.     Left Ear: External ear normal.     Nose: Nose normal.  Eyes:     General:        Right eye: No discharge.        Left eye: No discharge.     Conjunctiva/sclera: Conjunctivae normal.  Pupils: Pupils are equal, round, and reactive to light.  Neck:     Musculoskeletal: Normal range of motion and neck supple.     Thyroid: No thyromegaly.     Vascular: No JVD.  Cardiovascular:     Rate and Rhythm: Normal rate and regular rhythm.     Heart sounds: Normal heart sounds. No murmur. No friction rub. No gallop.   Pulmonary:     Effort: Pulmonary effort is normal.     Breath sounds: Normal breath sounds.  Abdominal:     General: Bowel sounds are normal.     Palpations: Abdomen  is soft. There is no mass.     Tenderness: There is no abdominal tenderness. There is no guarding.  Musculoskeletal: Normal range of motion.  Lymphadenopathy:     Cervical: No cervical adenopathy.     Upper Body:     Right upper body: No supraclavicular or axillary adenopathy.     Left upper body: No supraclavicular or axillary adenopathy.  Skin:    General: Skin is warm and dry.     Findings: Erythema present.  Neurological:     Mental Status: She is alert.     Deep Tendon Reflexes: Reflexes are normal and symmetric.     BP 130/80   Pulse 60   Ht 5\' 5"  (1.651 m)   Wt 209 lb (94.8 kg)   LMP 02/25/2018 (Exact Date) Comment: finished on 1/18  BMI 34.78 kg/m   Assessment and Plan: 1. Folliculitis Raised, red areas under Left armpit. Tender to touch. Course of doxycycline and advised patient to pick up antibacterial soap. Wash area twice a day, lathering up area well. Then cover apply bactroban ointment and cover with band-aide until clear.  - doxycycline (VIBRA-TABS) 100 MG tablet; Take 1 tablet (100 mg total) by mouth 2 (two) times daily.  Dispense: 20 tablet; Refill: 0 - mupirocin ointment (BACTROBAN) 2 %; Apply 1 application topically 2 (two) times daily.  Dispense: 22 g; Refill: 0

## 2018-02-27 NOTE — Patient Instructions (Signed)

## 2018-03-14 ENCOUNTER — Encounter: Payer: Self-pay | Admitting: Emergency Medicine

## 2018-03-14 ENCOUNTER — Emergency Department: Payer: 59

## 2018-03-14 ENCOUNTER — Other Ambulatory Visit: Payer: Self-pay

## 2018-03-14 ENCOUNTER — Emergency Department
Admission: EM | Admit: 2018-03-14 | Discharge: 2018-03-14 | Disposition: A | Discharge status: Home / Self Care | Payer: 59 | Attending: Emergency Medicine | Admitting: Emergency Medicine

## 2018-03-14 DIAGNOSIS — Z853 Personal history of malignant neoplasm of breast: Secondary | ICD-10-CM | POA: Diagnosis not present

## 2018-03-14 DIAGNOSIS — J45909 Unspecified asthma, uncomplicated: Secondary | ICD-10-CM | POA: Diagnosis not present

## 2018-03-14 DIAGNOSIS — Z87891 Personal history of nicotine dependence: Secondary | ICD-10-CM | POA: Insufficient documentation

## 2018-03-14 DIAGNOSIS — B9789 Other viral agents as the cause of diseases classified elsewhere: Secondary | ICD-10-CM | POA: Diagnosis not present

## 2018-03-14 DIAGNOSIS — J069 Acute upper respiratory infection, unspecified: Secondary | ICD-10-CM

## 2018-03-14 DIAGNOSIS — M7918 Myalgia, other site: Secondary | ICD-10-CM | POA: Diagnosis present

## 2018-03-14 DIAGNOSIS — R05 Cough: Secondary | ICD-10-CM | POA: Diagnosis not present

## 2018-03-14 MED ORDER — IPRATROPIUM-ALBUTEROL 0.5-2.5 (3) MG/3ML IN SOLN
3.0000 mL | Freq: Once | RESPIRATORY_TRACT | Status: AC
  Administered 2018-03-14: 3 mL via RESPIRATORY_TRACT
  Filled 2018-03-14: qty 3

## 2018-03-14 MED ORDER — METHYLPREDNISOLONE SODIUM SUCC 125 MG IJ SOLR
125.0000 mg | Freq: Once | INTRAMUSCULAR | Status: AC
  Administered 2018-03-14: 125 mg via INTRAMUSCULAR
  Filled 2018-03-14: qty 2

## 2018-03-14 MED ORDER — PREDNISONE 10 MG PO TABS: ORAL_TABLET | ORAL | 0 refills | Status: DC

## 2018-03-14 MED ORDER — GUAIFENESIN-CODEINE 100-10 MG/5ML PO SYRP: 5.0000 mL | ORAL_SOLUTION | Freq: Three times a day (TID) | ORAL | 0 refills | Status: AC | PRN

## 2018-03-14 NOTE — ED Notes (Signed)
See triage note   Presents with cough  States cough is prod  And started about 3 days ago  Afebrile on arrival

## 2018-03-14 NOTE — ED Triage Notes (Signed)
Pt arrives with complaint of productive cough that started Sunday. Pt reports no relief with OTC medication.

## 2018-03-14 NOTE — ED Provider Notes (Signed)
[facility] Emergency Department Provider Note  ____________________________________________  Time seen: Approximately 2:05 PM  I have reviewed the triage vital signs and the nursing notes.   HISTORY  Chief Complaint Cough    HPI Jill Walsh is a 48 y.o. female that presents to the emergency department for evaluation of body aches, hoarse voice, occasionally productive cough, vomiting, diarrhea for 3 days. She feels like she has had a fever but has not checked temperature. Patient states that she is coughing up clear sputum occasionally.  She can occasionally hear herself wheezing.  Patient has asthma and uses an inhaler.  No SOB, CP, abdominal pain.    Past Medical History:  Diagnosis Date  . Breast cancer ([place]) 2016   RT MASTECTOMY    Patient Active Problem List   Diagnosis Date Noted  . Simple chronic bronchitis ([place]) 05/30/2017  . Breast pain 12/20/2014  . Family history of breast cancer 05/28/2014  . Current smoker 05/28/2014  . Breast cancer ([place]) 05/28/2014  . Breast cancer, right breast ([place]) 03/28/2014    Past Surgical History:  Procedure Laterality Date  . BREAST BIOPSY Left    02/28/14 negative  . BREAST BIOPSY Right     02/2014 malignant  . MASTECTOMY Right 2016   BREAST CA  . TUBAL LIGATION      Prior to Admission medications   Medication Sig Start Date End Date Taking? Authorizing Provider  albuterol (PROVENTIL HFA;VENTOLIN HFA) 108 (90 Base) MCG/ACT inhaler Inhale 2 puffs into the lungs every 6 (six) hours as needed for wheezing or shortness of breath. 11/08/16   Juline Patch, MD  doxycycline (VIBRA-TABS) 100 MG tablet Take 1 tablet (100 mg total) by mouth 2 (two) times daily. 02/27/18   Juline Patch, MD  guaiFENesin-codeine (ROBITUSSIN AC) 100-10 MG/5ML syrup Take 5 mLs by mouth 3 (three) times daily as needed for up to 2 days for cough. 03/14/18 03/16/18  Laban Emperor, PA-C  meloxicam (MOBIC) 15 MG tablet Take 1 tablet (15  mg total) by mouth daily as needed. 12/23/17   Coral Spikes, DO  mupirocin ointment (BACTROBAN) 2 % Apply 1 application topically 2 (two) times daily. 02/27/18   Juline Patch, MD  predniSONE (DELTASONE) 10 MG tablet Take 6 tablets on day 1, take 5 tablets on day 2, take 4 tablets on day 3, take 3 tablets on day 4, take 2 tablets on day 5, take 1 tablet on day 6 03/14/18   Laban Emperor, PA-C    Allergies Patient has no known allergies.  Family History  Problem Relation Age of Onset  . Cancer Mother   . Breast cancer Mother 32  . Stroke Brother   . Cancer Maternal Aunt     Social History Social History   Tobacco Use  . Smoking status: Former Smoker    Years: 15.00  . Smokeless tobacco: Former Network engineer Use Topics  . Alcohol use: No  . Drug use: No     Review of Systems  Constitutional: Positive for feeling warm. Eyes: No visual changes. No discharge. ENT: Positive for congestion and rhinorrhea. Cardiovascular: No chest pain. Respiratory: Positive for cough. No SOB. Gastrointestinal: No abdominal pain.  Positive for nausea, vomiting, diarrhea.  No constipation. Musculoskeletal: Positive for body aches. Skin: Negative for rash, abrasions, lacerations, ecchymosis. Neurological: Negative for headaches.   ____________________________________________   PHYSICAL EXAM:  VITAL SIGNS: ED Triage Vitals  Enc Vitals Group     BP 03/14/18 1247 (!)  141/89     Pulse Rate 03/14/18 1247 90     Resp 03/14/18 1247 18     Temp 03/14/18 1247 97.7 F (36.5 C)     Temp src --      SpO2 03/14/18 1247 99 %     Weight 03/14/18 1249 192 lb (87.1 kg)     Height 03/14/18 1249 5\' 5"  (1.651 m)     Head Circumference --      Peak Flow --      Pain Score 03/14/18 1249 0     Pain Loc --      Pain Edu? --      Excl. in [place]? --      Constitutional: Alert and oriented. Well appearing and in no acute distress. Eyes: Conjunctivae are normal. PERRL. EOMI. No discharge. Head:  Atraumatic. ENT: No frontal and maxillary sinus tenderness.      Ears: Tympanic membranes pearly gray with good landmarks. No discharge.      Nose: Mild congestion/rhinnorhea.      Mouth/Throat: Mucous membranes are moist. Oropharynx non-erythematous. Tonsils not enlarged. No exudates. Uvula midline. Neck: No stridor.   Hematological/Lymphatic/Immunilogical: No cervical lymphadenopathy. Cardiovascular: Normal rate, regular rhythm.  Good peripheral circulation. Respiratory: Normal respiratory effort without tachypnea or retractions. Lungs CTAB. Good air entry to the bases with no decreased or absent breath sounds. Gastrointestinal: Bowel sounds 4 quadrants. Soft and nontender to palpation. No guarding or rigidity. No palpable masses. No distention. Musculoskeletal: Full range of motion to all extremities. No gross deformities appreciated. Neurologic:  Normal speech and language. No gross focal neurologic deficits are appreciated.  Skin:  Skin is warm, dry and intact. No rash noted. Psychiatric: Mood and affect are normal. Speech and behavior are normal. Patient exhibits appropriate insight and judgement.   ____________________________________________   LABS (all labs ordered are listed, but only abnormal results are displayed)  Labs Reviewed - No data to display ____________________________________________  EKG   ____________________________________________  RADIOLOGY Robinette Haines, personally viewed and evaluated these images (plain radiographs) as part of my medical decision making, as well as reviewing the written report by the radiologist.  Dg Chest 2 View  Result Date: 03/14/2018 CLINICAL DATA:  Pt arrives with complaint of productive cough that started Sunday. EXAM: CHEST - 2 VIEW COMPARISON:  11/11/2016 FINDINGS: Coarse perihilar bronchovascular markings. No confluent airspace disease or overt edema. Heart size upper limits normal. No pneumothorax. No effusion. Anterior  vertebral endplate spurring at multiple levels in the lower thoracic spine. IMPRESSION: No acute cardiopulmonary disease. Electronically Signed   By: Lucrezia Europe M.D.   On: 03/14/2018 13:33    ____________________________________________    PROCEDURES  Procedure(s) performed:    Procedures    Medications  ipratropium-albuterol (DUONEB) 0.5-2.5 (3) MG/3ML nebulizer solution 3 mL (3 mLs Nebulization Given 03/14/18 1450)  methylPREDNISolone sodium succinate (SOLU-MEDROL) 125 mg/2 mL injection 125 mg (125 mg Intramuscular Given 03/14/18 1451)     ____________________________________________   INITIAL IMPRESSION / ASSESSMENT AND PLAN / ED COURSE  Pertinent labs & imaging results that were available during my care of the patient were reviewed by me and considered in my medical decision making (see chart for details).  Review of the [place] CSRS was performed in accordance of the [place] prior to dispensing any controlled drugs.  Patient's diagnosis is consistent with viral URI. Vital signs and exam are reassuring.  Patient was given IM Solu-Medrol and DuoNeb for wheezing.  She feels well.  Patient appears well  and is staying well hydrated. Patient should alternate tylenol and ibuprofen for fever. Patient feels comfortable going home. Patient will be discharged home with prescriptions for prednisone, robitussin. Patient is to follow up with PCP as needed or otherwise directed. Patient is given ED precautions to return to the ED for any worsening or new symptoms.     ____________________________________________  FINAL CLINICAL IMPRESSION(S) / ED DIAGNOSES  Final diagnoses:  Viral URI with cough  Uncomplicated asthma, unspecified asthma severity, unspecified whether persistent      NEW MEDICATIONS STARTED DURING THIS VISIT:  ED Discharge Orders         Ordered    predniSONE (DELTASONE) 10 MG tablet     03/14/18 1518    guaiFENesin-codeine (ROBITUSSIN AC) 100-10 MG/5ML syrup  3 times  daily PRN     03/14/18 1518              This chart was dictated using voice recognition software/Dragon. Despite best efforts to proofread, errors can occur which can change the meaning. Any change was purely unintentional.    Laban Emperor, PA-C 03/14/18 [place], [place], MD 03/15/18 [phone]

## 2018-03-21 ENCOUNTER — Encounter: Payer: Self-pay | Admitting: Family Medicine

## 2018-03-21 ENCOUNTER — Ambulatory Visit: Payer: 59 | Admitting: Family Medicine

## 2018-03-21 VITALS — BP 122/84 | HR 92 | Temp 98.2°F | Ht 65.0 in | Wt 207.0 lb

## 2018-03-21 DIAGNOSIS — J209 Acute bronchitis, unspecified: Secondary | ICD-10-CM | POA: Diagnosis not present

## 2018-03-21 DIAGNOSIS — J45901 Unspecified asthma with (acute) exacerbation: Secondary | ICD-10-CM | POA: Diagnosis not present

## 2018-03-21 MED ORDER — IPRATROPIUM-ALBUTEROL 0.5-2.5 (3) MG/3ML IN SOLN
3.0000 mL | Freq: Once | RESPIRATORY_TRACT | Status: AC
  Administered 2018-03-21: 3 mL via RESPIRATORY_TRACT

## 2018-03-21 MED ORDER — IPRATROPIUM-ALBUTEROL 0.5-2.5 (3) MG/3ML IN SOLN: 3.0000 mL | Freq: Four times a day (QID) | RESPIRATORY_TRACT | 1 refills | Status: DC | PRN

## 2018-03-21 MED ORDER — AZITHROMYCIN 250 MG PO TABS: ORAL_TABLET | ORAL | 0 refills | Status: DC

## 2018-03-21 MED ORDER — BENZONATATE 100 MG PO CAPS: 100.0000 mg | ORAL_CAPSULE | Freq: Two times a day (BID) | ORAL | 0 refills | Status: DC | PRN

## 2018-03-21 NOTE — Progress Notes (Signed)
Date:  03/21/2018   Name:  Jill Walsh   DOB:  October 02, 1970   MRN:  [id number]   Chief Complaint: Follow-up (was seen in ER on 2/4- Dx asthma- had solumedrol inj, neb Tx, cough syrup, pred taper. Chest xray was normal. Still coughing- no production, diarrhea, sore throat)  Cough  This is a new problem. The current episode started 1 to 4 weeks ago (10 days ago). The problem has been gradually improving (on inhaler). The cough is productive of sputum (clear). Associated symptoms include chills, shortness of breath and wheezing. Pertinent negatives include no chest pain, ear congestion, ear pain, eye redness, fever, headaches, heartburn, hemoptysis, myalgias, nasal congestion, postnasal drip, rash, rhinorrhea, sore throat, sweats or weight loss. The symptoms are aggravated by lying down. She has tried a beta-agonist inhaler, oral steroids and prescription cough suppressant for the symptoms. The treatment provided mild relief. Her past medical history is significant for asthma. There is no history of bronchiectasis, bronchitis, COPD, emphysema or environmental allergies.  Asthma  She complains of chest tightness, cough, shortness of breath, sputum production and wheezing. There is no difficulty breathing, frequent throat clearing, hemoptysis or hoarse voice. This is a new problem. The current episode started 1 to 4 weeks ago. The problem occurs intermittently. The problem has been waxing and waning. The cough is productive, barking and croupy. Associated symptoms include malaise/fatigue, orthopnea and sneezing. Pertinent negatives include no chest pain, dyspnea on exertion, ear congestion, ear pain, fever, headaches, heartburn, myalgias, nasal congestion, PND, postnasal drip, rhinorrhea, sore throat, sweats, trouble swallowing or weight loss. Her symptoms are aggravated by change in weather. Her symptoms are alleviated by beta-agonist. Her past medical history is significant for asthma. There is no history  of bronchiectasis, bronchitis, COPD or emphysema.    Review of Systems  Constitutional: Positive for chills and malaise/fatigue. Negative for diaphoresis, fatigue, fever, unexpected weight change and weight loss.  HENT: Positive for sneezing. Negative for congestion, ear discharge, ear pain, hoarse voice, postnasal drip, rhinorrhea, sinus pressure, sore throat and trouble swallowing.   Eyes: Negative for photophobia, pain, discharge, redness and itching.  Respiratory: Positive for cough, sputum production, shortness of breath and wheezing. Negative for apnea, hemoptysis, choking and stridor.   Cardiovascular: Negative for chest pain, dyspnea on exertion, palpitations, leg swelling and PND.  Gastrointestinal: Negative for abdominal pain, blood in stool, constipation, diarrhea, heartburn, nausea and vomiting.  Endocrine: Negative for cold intolerance, heat intolerance, polydipsia, polyphagia and polyuria.  Genitourinary: Negative for dysuria, flank pain, frequency, hematuria, menstrual problem, pelvic pain, urgency, vaginal bleeding and vaginal discharge.  Musculoskeletal: Negative for arthralgias, back pain, myalgias and neck stiffness.  Skin: Negative for color change, pallor, rash and wound.  Allergic/Immunologic: Negative for environmental allergies and food allergies.  Neurological: Negative for dizziness, weakness, light-headedness, numbness and headaches.  Hematological: Negative for adenopathy. Does not bruise/bleed easily.  Psychiatric/Behavioral: Negative for dysphoric mood. The patient is not nervous/anxious.     Patient Active Problem List   Diagnosis Date Noted  . Simple chronic bronchitis ([place]) 05/30/2017  . Breast pain 12/20/2014  . Family history of breast cancer 05/28/2014  . Current smoker 05/28/2014  . Breast cancer ([place]) 05/28/2014  . Breast cancer, right breast ([place]) 03/28/2014    No Known Allergies  Past Surgical History:  Procedure Laterality Date  . BREAST  BIOPSY Left    02/28/14 negative  . BREAST BIOPSY Right     02/2014 malignant  . MASTECTOMY Right 2016   BREAST  CA  . TUBAL LIGATION      Social History   Tobacco Use  . Smoking status: Former Smoker    Years: 15.00  . Smokeless tobacco: Former Network engineer Use Topics  . Alcohol use: No  . Drug use: No     Medication list has been reviewed and updated.  No outpatient medications have been marked as taking for the 03/21/18 encounter (Office Visit) with Juline Patch, MD.    [facility] 2/9 Scores 08/18/2017 11/08/2016  PHQ - 2 Score 6 0  PHQ- 9 Score 18 0    Physical Exam Vitals signs and nursing note reviewed.  Constitutional:      General: She is not in acute distress.    Appearance: She is normal weight. She is not diaphoretic.  HENT:     Head: Normocephalic and atraumatic.     Right Ear: Tympanic membrane, ear canal and external ear normal. There is no impacted cerumen.     Left Ear: Tympanic membrane, ear canal and external ear normal. There is no impacted cerumen.     Nose: Nose normal. No congestion or rhinorrhea.     Mouth/Throat:     Mouth: Mucous membranes are moist.     Pharynx: Oropharynx is clear. No oropharyngeal exudate or posterior oropharyngeal erythema.  Eyes:     General:        Right eye: No discharge.        Left eye: No discharge.     Extraocular Movements: Extraocular movements intact.     Conjunctiva/sclera: Conjunctivae normal.     Pupils: Pupils are equal, round, and reactive to light.  Neck:     Musculoskeletal: Normal range of motion and neck supple.     Thyroid: No thyromegaly.     Vascular: No carotid bruit or JVD.  Cardiovascular:     Rate and Rhythm: Normal rate and regular rhythm.     Pulses: Normal pulses.     Heart sounds: Normal heart sounds. No murmur. No friction rub. No gallop.   Pulmonary:     Effort: Pulmonary effort is normal. No respiratory distress.     Breath sounds: No stridor. Wheezing present. No rhonchi or rales.    Chest:     Chest wall: No tenderness.  Abdominal:     General: Abdomen is flat. Bowel sounds are normal. There is no distension.     Palpations: Abdomen is soft. There is no mass.     Tenderness: There is no abdominal tenderness. There is no guarding or rebound.  Musculoskeletal: Normal range of motion.     Left lower leg: Edema present.  Lymphadenopathy:     Cervical: No cervical adenopathy.  Skin:    General: Skin is warm and dry.  Neurological:     Mental Status: She is alert.     Deep Tendon Reflexes: Reflexes are normal and symmetric.     BP 122/84   Pulse 92   Temp 98.2 F (36.8 C) (Oral)   Ht 5\' 5"  (1.651 m)   Wt 207 lb (93.9 kg)   LMP 02/25/2018 (Exact Date) Comment: finished on 1/18  SpO2 99%   BMI 34.45 kg/m   Assessment and Plan: 1. Acute bronchitis with asthma with acute exacerbation Patient has almost persistent cough while sitting but slows and calms when she is occupied or walking.  Patient was noted to have decreased air movement rales rhonchi or rubs there is given a nebulization with DuoNeb and there was some  improvement with air movement but cough was persisting.  Patient was initiated on azithromycin 2 today and 1 a day for 4 days and given Tessalon Perles for cough as well as started on a nebulization regimen at home every 6-8 hours.  Will be rechecked on Thursday to determine if improved to baseline.  Patient may return as needed any exacerbation from today. - ipratropium-albuterol (DUONEB) 0.5-2.5 (3) MG/3ML SOLN; Take 3 mLs by nebulization every 6 (six) hours as needed.  Dispense: 360 mL; Refill: 1 - azithromycin (ZITHROMAX) 250 MG tablet; 2 today then 1 a day for 4 days  Dispense: 6 tablet; Refill: 0 - benzonatate (TESSALON) 100 MG capsule; Take 1 capsule (100 mg total) by mouth 2 (two) times daily as needed for cough.  Dispense: 20 capsule; Refill: 0 - ipratropium-albuterol (DUONEB) 0.5-2.5 (3) MG/3ML nebulizer solution 3 mL

## 2018-03-23 ENCOUNTER — Encounter: Payer: Self-pay | Admitting: Family Medicine

## 2018-03-23 ENCOUNTER — Ambulatory Visit: Payer: 59 | Admitting: Family Medicine

## 2018-03-23 VITALS — BP 118/80 | HR 98 | Ht 65.0 in | Wt 211.0 lb

## 2018-03-23 DIAGNOSIS — J209 Acute bronchitis, unspecified: Secondary | ICD-10-CM

## 2018-03-23 DIAGNOSIS — J45901 Unspecified asthma with (acute) exacerbation: Secondary | ICD-10-CM | POA: Diagnosis not present

## 2018-03-23 DIAGNOSIS — J4521 Mild intermittent asthma with (acute) exacerbation: Secondary | ICD-10-CM | POA: Diagnosis not present

## 2018-03-23 MED ORDER — IPRATROPIUM-ALBUTEROL 0.5-2.5 (3) MG/3ML IN SOLN: 3.0000 mL | Freq: Four times a day (QID) | RESPIRATORY_TRACT | 1 refills | Status: DC | PRN

## 2018-03-23 MED ORDER — ALBUTEROL SULFATE HFA 108 (90 BASE) MCG/ACT IN AERS: 2.0000 | INHALATION_SPRAY | Freq: Four times a day (QID) | RESPIRATORY_TRACT | 0 refills | Status: DC | PRN

## 2018-03-23 NOTE — Progress Notes (Signed)
Date:  03/23/2018   Name:  Jill Walsh   DOB:  02-20-1970   MRN:  [id number]   Chief Complaint: Follow-up (on pred, [employer], needs neb machine)  Asthma  She complains of cough, frequent throat clearing and wheezing. There is no chest tightness, difficulty breathing, hemoptysis, hoarse voice, shortness of breath or sputum production. Primary symptoms comments: HPA albuterol inhaler. The current episode started more than 1 year ago. The problem occurs intermittently. The problem has been gradually improving. Pertinent negatives include no ear pain, fever, headaches, myalgias, rhinorrhea, sneezing or sore throat. Her past medical history is significant for asthma.    Review of Systems  Constitutional: Negative.  Negative for chills, fatigue, fever and unexpected weight change.  HENT: Negative for congestion, ear discharge, ear pain, hoarse voice, rhinorrhea, sinus pressure, sneezing and sore throat.   Eyes: Negative for photophobia, pain, discharge, redness and itching.  Respiratory: Positive for cough and wheezing. Negative for hemoptysis, sputum production, shortness of breath and stridor.   Gastrointestinal: Negative for abdominal pain, blood in stool, constipation, diarrhea, nausea and vomiting.  Endocrine: Negative for cold intolerance, heat intolerance, polydipsia, polyphagia and polyuria.  Genitourinary: Negative for dysuria, flank pain, frequency, hematuria, menstrual problem, pelvic pain, urgency, vaginal bleeding and vaginal discharge.  Musculoskeletal: Negative for arthralgias, back pain and myalgias.  Skin: Negative for rash.  Allergic/Immunologic: Negative for environmental allergies and food allergies.  Neurological: Negative for dizziness, weakness, light-headedness, numbness and headaches.  Hematological: Negative for adenopathy. Does not bruise/bleed easily.  Psychiatric/Behavioral: Negative for dysphoric mood. The patient is not nervous/anxious.     Patient Active Problem  List   Diagnosis Date Noted  . Simple chronic bronchitis ([place]) 05/30/2017  . Breast pain 12/20/2014  . Family history of breast cancer 05/28/2014  . Current smoker 05/28/2014  . Breast cancer ([place]) 05/28/2014  . Breast cancer, right breast ([place]) 03/28/2014    No Known Allergies  Past Surgical History:  Procedure Laterality Date  . BREAST BIOPSY Left    02/28/14 negative  . BREAST BIOPSY Right     02/2014 malignant  . MASTECTOMY Right 2016   BREAST CA  . TUBAL LIGATION      Social History   Tobacco Use  . Smoking status: Former Smoker    Years: 15.00  . Smokeless tobacco: Former Network engineer Use Topics  . Alcohol use: No  . Drug use: No     Medication list has been reviewed and updated.  Current Meds  Medication Sig  . albuterol (PROVENTIL HFA;VENTOLIN HFA) 108 (90 Base) MCG/ACT inhaler Inhale 2 puffs into the lungs every 6 (six) hours as needed for wheezing or shortness of breath.  Marland Kitchen azithromycin (ZITHROMAX) 250 MG tablet 2 today then 1 a day for 4 days  . benzonatate (TESSALON) 100 MG capsule Take 1 capsule (100 mg total) by mouth 2 (two) times daily as needed for cough.  Marland Kitchen ipratropium-albuterol (DUONEB) 0.5-2.5 (3) MG/3ML SOLN Take 3 mLs by nebulization every 6 (six) hours as needed.  . meloxicam (MOBIC) 15 MG tablet Take 1 tablet (15 mg total) by mouth daily as needed.  . mupirocin ointment (BACTROBAN) 2 % Apply 1 application topically 2 (two) times daily.  . predniSONE (DELTASONE) 10 MG tablet Take 6 tablets on day 1, take 5 tablets on day 2, take 4 tablets on day 3, take 3 tablets on day 4, take 2 tablets on day 5, take 1 tablet on day 6    PHQ 2/9 Scores  08/18/2017 11/08/2016  PHQ - 2 Score 6 0  PHQ- 9 Score 18 0    Physical Exam Vitals signs and nursing note reviewed.  Constitutional:      General: She is not in acute distress.    Appearance: She is not diaphoretic.  HENT:     Head: Normocephalic and atraumatic.     Right Ear: External ear normal.      Left Ear: External ear normal.     Nose: Nose normal.  Eyes:     General:        Right eye: No discharge.        Left eye: No discharge.     Conjunctiva/sclera: Conjunctivae normal.     Pupils: Pupils are equal, round, and reactive to light.  Neck:     Musculoskeletal: Normal range of motion and neck supple.     Thyroid: No thyromegaly.     Vascular: No JVD.  Cardiovascular:     Rate and Rhythm: Normal rate and regular rhythm.     Heart sounds: Normal heart sounds. No murmur. No friction rub. No gallop.   Pulmonary:     Effort: Pulmonary effort is normal.     Breath sounds: Normal breath sounds. No decreased air movement. No decreased breath sounds, wheezing, rhonchi or rales.     Comments: Minimal wheezes Abdominal:     General: Bowel sounds are normal.     Palpations: Abdomen is soft. There is no mass.     Tenderness: There is no abdominal tenderness. There is no guarding.  Musculoskeletal: Normal range of motion.  Lymphadenopathy:     Cervical: No cervical adenopathy.  Skin:    General: Skin is warm and dry.  Neurological:     Mental Status: She is alert.     Deep Tendon Reflexes: Reflexes are normal and symmetric.     BP 118/80   Pulse 98   Ht 5\' 5"  (1.651 m)   Wt 211 lb (95.7 kg)   LMP 02/25/2018 (Exact Date) Comment: finished on 1/18  SpO2 98%   BMI 35.11 kg/m   Assessment and Plan: 1. Mild intermittent asthma with acute exacerbation Continue using albuterol inhaler as needed and duoneb nebulization. Pick up Nebulizer at [employer]. - albuterol (PROVENTIL HFA;VENTOLIN HFA) 108 (90 Base) MCG/ACT inhaler; Inhale 2 puffs into the lungs every 6 (six) hours as needed for wheezing or shortness of breath.  Dispense: 1 Inhaler; Refill: 0 - ipratropium-albuterol (DUONEB) 0.5-2.5 (3) MG/3ML SOLN; Take 3 mLs by nebulization every 6 (six) hours as needed.  Dispense: 360 mL; Refill: 1  2. Acute bronchitis with asthma with acute exacerbation Improved on prednisone  taper and nebulizer as needed. - albuterol (PROVENTIL HFA;VENTOLIN HFA) 108 (90 Base) MCG/ACT inhaler; Inhale 2 puffs into the lungs every 6 (six) hours as needed for wheezing or shortness of breath.  Dispense: 1 Inhaler; Refill: 0 - ipratropium-albuterol (DUONEB) 0.5-2.5 (3) MG/3ML SOLN; Take 3 mLs by nebulization every 6 (six) hours as needed.  Dispense: 360 mL; Refill: 1

## 2018-03-30 NOTE — Progress Notes (Deleted)
[facility] day:  03/30/18   Chief Complaint: Jill Walsh is a 48 y.o. 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  HPI: The patient was last seen in the medical oncology clinic on 09/26/2017.  At that time, she denied any breast concerns.  Exam was stable.  CA27.29 was normal.  Left mammogram on 12/12/2017 revealed no evidence of malignancy.  During the interim,    Past Medical History:  Diagnosis Date  . Breast cancer (Jill Walsh) 2016   RT MASTECTOMY    Past Surgical History:  Procedure Laterality Date  . BREAST BIOPSY Left    02/28/14 negative  . BREAST BIOPSY Right     02/2014 malignant  . MASTECTOMY Right 2016   BREAST CA  . TUBAL LIGATION      Family History  Problem Relation Age of Onset  . Cancer Mother   . Breast cancer Mother 69  . Stroke Brother   . Cancer Maternal Aunt     Social History:  reports that she has quit smoking. She quit after 15.00 years of use. She has quit using smokeless tobacco. She reports that she does not drink alcohol or use drugs.  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  The patient is accompanied by her fiance, Jill Walsh, today.  They are building a house together.  She is alone today.  Allergies: No Known Allergies  Current Medications: Current Outpatient Medications  Medication Sig Dispense Refill  . albuterol (PROVENTIL HFA;VENTOLIN HFA) 108 (90 Base) MCG/ACT inhaler Inhale 2 puffs into the lungs every 6 (six) hours as needed for wheezing or shortness of breath. 1 Inhaler 0  . azithromycin (ZITHROMAX) 250 MG tablet 2 today then 1 a day for 4 days 6 tablet 0  . benzonatate (TESSALON) 100 MG capsule Take 1 capsule (100 mg total) by mouth 2 (two) times daily as needed for cough. 20 capsule 0  . ipratropium-albuterol (DUONEB) 0.5-2.5 (3) MG/3ML SOLN Take 3 mLs  by nebulization every 6 (six) hours as needed. 360 mL 1  . meloxicam (MOBIC) 15 MG tablet Take 1 tablet (15 mg total) by mouth daily as needed. 30 tablet 0  . mupirocin ointment (BACTROBAN) 2 % Apply 1 application topically 2 (two) times daily. 22 g 0  . predniSONE (DELTASONE) 10 MG tablet Take 6 tablets on day 1, take 5 tablets on day 2, take 4 tablets on day 3, take 3 tablets on day 4, take 2 tablets on day 5, take 1 tablet on day 6 21 tablet 0   No current facility-administered medications for this visit.     Review of Systems:  GENERAL:  Feels "good".  No fevers, sweats or weight loss.  Weight up 1 pound. PERFORMANCE STATUS (ECOG):  0 HEENT:  No visual changes, runny nose, sore throat, mouth sores or tenderness. Lungs: No shortness of breath or cough.  No hemoptysis. Cardiac:  No chest pain, palpitations, orthopnea, or PND. GI:  No nausea, vomiting, diarrhea, constipation, melena or hematochezia. GU:  No urgency, frequency, dysuria, or hematuria. Musculoskeletal:  No back pain.  No joint pain.  No muscle tenderness. Extremities:  No pain or swelling. Skin:  No rashes or skin changes. Neuro:  No headache, numbness or weakness, balance or coordination issues. Endocrine:  No diabetes, thyroid issues.  Hot flashes.  No night sweats.  Psych:  Anxiety attacks.  No depression.. Pain:  No focal pain. Review of systems:  All other systems reviewed and found to be negative.   Physical Exam: There were no vitals taken for this visit. GENERAL:  Well developed, well nourished, woman sitting comfortably in the exam room in no acute distress.  MENTAL STATUS:  Alert and oriented to person, place and time. HEAD: Curly brown hair.  Normocephalic, atraumatic, face symmetric, no Cushingoid features. EYES:  Brown eyes.  Pupils equal round and reactive to light and accomodation.  No conjunctivitis or scleral icterus. ENT:  Oropharynx clear without lesion.  Tongue normal. Mucous membranes moist.   RESPIRATORY:  Clear to auscultation without rales, wheezes or rhonchi. CARDIOVASCULAR:  Regular rate and rhythm without murmur, rub or gallop. BREAST:  Right mastectomy.  No erythema, skin changes or nodularity.  Left breast with scattered fibrocystic changes. No masses, skin changes or nipple discharge. ABDOMEN:  Soft, non-tender, with active bowel sounds, and no hepatosplenomegaly.  No masses. SKIN:  No rashes, ulcers or lesions. EXTREMITIES: No edema, no skin discoloration or tenderness.  No palpable cords. LYMPH NODES: No palpable cervical, supraclavicular, axillary or inguinal adenopathy  NEUROLOGICAL: Unremarkable. PSYCH:  Appropriate.  She is tearful at times.   No visits with results within 3 Day(s) from this visit.  Latest known visit with results is:  Appointment on 09/26/2017  Component Date Value Ref Range Status  . CA 27.29 09/26/2017 16.8  0.0 - 38.6 U/mL Final   Comment: (NOTE) Siemens Centaur Immunochemiluminometric Methodology [facility]) Values obtained with different assay methods or kits cannot be used interchangeably. Results cannot be interpreted as absolute evidence of the presence or absence of malignant disease. Performed At: [facility] [place], [place] [id number] Rush Farmer MD [id number] Performed At: [facility] [place], [place] [id number] Nechama Guard MD PZ:[id number]   . Sodium 09/26/2017 139  135 - 145 mmol/L Final  . Potassium 09/26/2017 3.7  3.5 - 5.1 mmol/L Final  . Chloride 09/26/2017 104  98 - 111 mmol/L Final  . CO2 09/26/2017 24  22 - 32 mmol/L Final  . Glucose, Bld 09/26/2017 101* 70 - 99 mg/dL Final  . BUN 09/26/2017 12  6 - 20 mg/dL Final  . Creatinine, Ser 09/26/2017 0.87  0.44 - 1.00 mg/dL Final  . Calcium 09/26/2017 9.5  8.9 - 10.3 mg/dL Final  . Total Protein 09/26/2017 7.9  6.5 - 8.1 g/dL Final  . Albumin 09/26/2017 4.1  3.5 - 5.0 g/dL Final  . AST 09/26/2017 19  15 - 41 U/L Final   . ALT 09/26/2017 26  0 - 44 U/L Final  . Alkaline Phosphatase 09/26/2017 93  38 - 126 U/L Final  . Total Bilirubin 09/26/2017 0.4  0.3 - 1.2 mg/dL Final  . GFR calc non Af Amer 09/26/2017 >60  >60 mL/min Final  . GFR calc Af Amer 09/26/2017 >60  >60 mL/min Final   Comment: (NOTE) The eGFR has been calculated using the CKD EPI equation. This calculation has not been validated in all clinical situations. eGFR's persistently <60 mL/min signify possible Chronic Kidney Disease.   Georgiann Hahn gap 09/26/2017 11  5 - 15 Final   Performed at [facility], [address] [place], [place] [address]  . WBC 09/26/2017 10.2  3.6 - 11.0 K/uL Final  . RBC 09/26/2017 4.44  3.80 - 5.20 MIL/uL Final  . Hemoglobin 09/26/2017 12.6  12.0 - 16.0 g/dL Final  .  HCT 09/26/2017 37.6  35.0 - 47.0 % Final  . MCV 09/26/2017 84.5  80.0 - 100.0 fL Final  . MCH 09/26/2017 28.3  26.0 - 34.0 pg Final  . MCHC 09/26/2017 33.5  32.0 - 36.0 g/dL Final  . RDW 09/26/2017 14.5  11.5 - 14.5 % Final  . Platelets 09/26/2017 302  150 - 440 K/uL Final  . Neutrophils Relative % 09/26/2017 69  % Final  . Neutro Abs 09/26/2017 7.0* 1.4 - 6.5 K/uL Final  . Lymphocytes Relative 09/26/2017 24  % Final  . Lymphs Abs 09/26/2017 2.4  1.0 - 3.6 K/uL Final  . Monocytes Relative 09/26/2017 6  % Final  . Monocytes Absolute 09/26/2017 0.6  0.2 - 0.9 K/uL Final  . Eosinophils Relative 09/26/2017 1  % Final  . Eosinophils Absolute 09/26/2017 0.1  0 - 0.7 K/uL Final  . Basophils Relative 09/26/2017 0  % Final  . Basophils Absolute 09/26/2017 0.0  0 - 0.1 K/uL Final   Performed at [facility], [address] [place], [place] [address]    Assessment:  Jill Walsh is a 48 y.o. 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  No lymph nodes were removed.  Tumor was ER/PR negative and Her2/neu 3+.  Pathologic stage was T46mNx.    Left mammogram and ultrasound on 12/06/2014  revealed no mammographic or sonographic evidence of malignancy in the left breast.  There was an incidental note of two less than 4 mm benign appearing cysts at the 9:30 o'clock 9 cm and 10 cm from the nipple.  Left screening mammogram on 12/09/2016 revealed no mammographic evidence of malignancy.  Left screening mammogram on 12/12/2017 revealed no evidence of malignancy.  CA27.29 has been followed: 24.4 on 04/30/2014, 18.0 on 11/25/2014, 26.8 on 03/14/2015, 20.6 on 07/14/2015, 19.1 on 12/11/2015, 21.6 on 04/15/2016, 18.6 on 10/18/2016, 17.6 on 03/29/2017, and 16.8 on 09/26/2017.  MyRisk genetic testing revealed no clinically significant mutation.  She has a mother who developed breast cancer at age 49  A maternal aunt has colon cancer.    Symptomatically,   Plan: 1.  Labs today: CBC with diff, CMP, CA27.29.   2.  Her2neu+ multi-focal microinvasive right breast cancer:  Clinically doing well.  Mammogram on 12/09/2017. 3.  RTC in 6 months for MD assessment and labs (CBC with diff, CMP, CA27.29).   MLequita Asal MD  03/30/2018, 8:21 PM   I saw and evaluated the patient, participating in the key portions of the service and reviewing pertinent diagnostic studies and records.  I reviewed the nurse practitioner's note and agree with the findings and the plan.  The assessment and plan were discussed with the patient.  A few questions were asked by the patient and answered.   MLequita Asal MD  03/30/2018 , 8:21 PM

## 2018-03-31 ENCOUNTER — Ambulatory Visit: Payer: 59 | Admitting: Hematology and Oncology

## 2018-03-31 ENCOUNTER — Other Ambulatory Visit: Payer: 59

## 2018-04-05 ENCOUNTER — Inpatient Hospital Stay (HOSPITAL_BASED_OUTPATIENT_CLINIC_OR_DEPARTMENT_OTHER): Payer: 59 | Admitting: Hematology and Oncology

## 2018-04-05 ENCOUNTER — Encounter: Payer: Self-pay | Admitting: Hematology and Oncology

## 2018-04-05 ENCOUNTER — Inpatient Hospital Stay: Payer: 59 | Attending: Hematology and Oncology

## 2018-04-05 VITALS — BP 151/87 | HR 86 | Temp 97.7°F | Resp 16 | Wt 209.5 lb

## 2018-04-05 DIAGNOSIS — Z9011 Acquired absence of right breast and nipple: Secondary | ICD-10-CM

## 2018-04-05 DIAGNOSIS — C50911 Malignant neoplasm of unspecified site of right female breast: Secondary | ICD-10-CM | POA: Insufficient documentation

## 2018-04-05 DIAGNOSIS — Z171 Estrogen receptor negative status [ER-]: Secondary | ICD-10-CM

## 2018-04-05 DIAGNOSIS — Z87891 Personal history of nicotine dependence: Secondary | ICD-10-CM

## 2018-04-05 LAB — CBC WITH DIFFERENTIAL/PLATELET
Abs Immature Granulocytes: 0.03 10*3/uL (ref 0.00–0.07)
Basophils Absolute: 0.1 10*3/uL (ref 0.0–0.1)
Basophils Relative: 1 %
Eosinophils Absolute: 0.1 10*3/uL (ref 0.0–0.5)
Eosinophils Relative: 2 %
HCT: 37.5 % (ref 36.0–46.0)
Hemoglobin: 12.4 g/dL (ref 12.0–15.0)
Immature Granulocytes: 0 %
Lymphocytes Relative: 31 %
Lymphs Abs: 2.6 10*3/uL (ref 0.7–4.0)
MCH: 27.9 pg (ref 26.0–34.0)
MCHC: 33.1 g/dL (ref 30.0–36.0)
MCV: 84.5 fL (ref 80.0–100.0)
Monocytes Absolute: 0.5 10*3/uL (ref 0.1–1.0)
Monocytes Relative: 7 %
Neutro Abs: 5.1 10*3/uL (ref 1.7–7.7)
Neutrophils Relative %: 59 %
Platelets: 280 10*3/uL (ref 150–400)
RBC: 4.44 MIL/uL (ref 3.87–5.11)
RDW: 15 % (ref 11.5–15.5)
WBC: 8.4 10*3/uL (ref 4.0–10.5)
nRBC: 0 % (ref 0.0–0.2)

## 2018-04-05 LAB — COMPREHENSIVE METABOLIC PANEL
ALT: 17 U/L (ref 0–44)
AST: 18 U/L (ref 15–41)
Albumin: 4.1 g/dL (ref 3.5–5.0)
Alkaline Phosphatase: 76 U/L (ref 38–126)
Anion gap: 9 (ref 5–15)
BUN: 13 mg/dL (ref 6–20)
CO2: 26 mmol/L (ref 22–32)
Calcium: 9.5 mg/dL (ref 8.9–10.3)
Chloride: 103 mmol/L (ref 98–111)
Creatinine, Ser: 0.8 mg/dL (ref 0.44–1.00)
GFR calc Af Amer: >60 mL/min (ref >60)
GFR calc non Af Amer: >60 mL/min (ref >60)
Glucose, Bld: 111 mg/dL — ABNORMAL HIGH (ref 70–99)
Potassium: 3.7 mmol/L (ref 3.5–5.1)
Sodium: 138 mmol/L (ref 135–145)
Total Bilirubin: 0.4 mg/dL (ref 0.3–1.2)
Total Protein: 8.1 g/dL (ref 6.5–8.1)

## 2018-04-05 NOTE — Progress Notes (Signed)
Pt here for follow up. Reports she is having pain in right breast d/t pressure from left breast because of position she is lying when going to sleep. Reports this has been going on "for awhile" Denies any other concerns at this time.

## 2018-04-05 NOTE — Progress Notes (Signed)
[facility] day:  04/05/18   Chief Complaint: Jill Walsh is a 48 y.o. 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  HPI: The patient was last seen in the medical oncology clinic on 09/26/2017.  At that time, she denied any breast concerns.  Exam was stable.  CA27.29 was normal.  Left mammogram on 12/12/2017 revealed no evidence of malignancy.  During the interim, patient has been under a great deal of stress.  Her father passed away from cancer.  Her brother was just diagnosed with cancer and is currently undergoing chemotherapy and radiation. Patient's 67 year old son has tried to commit suicide twice.    Patient denies that she has experienced any B symptoms. She denies any interval infections.  Patient complains on pain in her RIGHT breast when in bed at night. Patient performs monthly self breast examinations as recommended.  She makes mention of some areas of painful swelling to her LEFT axilla. She has been seen by PCP and advised that they areas were "cysts". She was prescribed a topical, which she notes was effective.   She is unable to lie on her LEFT side due to orthopnea. Patient has recently been diagnosed with OSAH, for which she is on nocturnal PAP therapy.   Patient advises that she maintains an adequate appetite. She is eating well. Weight today is 209 lb 8.8 oz (95.1 kg), which compared to her last visit to the clinic, represents a 10 pound increase.   Patient denies pain in the clinic today.   Past Medical History:  Diagnosis Date  . Breast cancer ([place]) 2016   RT MASTECTOMY    Past Surgical History:  Procedure Laterality Date  . BREAST BIOPSY Left    02/28/14 negative  . BREAST BIOPSY Right     02/2014 malignant  . MASTECTOMY Right 2016   BREAST CA  . TUBAL LIGATION      Family History  Problem Relation Age of Onset  . Cancer Mother   .  Breast cancer Mother 52  . Stroke Brother   . Cancer Maternal Aunt     Social History:  reports that she has quit smoking. She quit after 15.00 years of use. She has quit using smokeless tobacco. She reports that she does not drink alcohol or use drugs.  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  The patient is accompanied by her fiance, [name], today.  They are building a house together.  She is alone today.  Allergies: No Known Allergies  Current Medications: Current Outpatient Medications  Medication Sig Dispense Refill  . albuterol (PROVENTIL HFA;VENTOLIN HFA) 108 (90 Base) MCG/ACT inhaler Inhale 2 puffs into the lungs every 6 (six) hours as needed for wheezing or shortness of breath. 1 Inhaler 0  . ipratropium-albuterol (DUONEB) 0.5-2.5 (3) MG/3ML SOLN Take 3 mLs by nebulization every 6 (six) hours as needed. (Patient taking differently: Take 3 mLs by nebulization at bedtime. ) 360 mL 1   No current facility-administered medications for this visit.     Review of Systems:  GENERAL:  Feels "good until I lay down".  No fevers, sweats.  Weight up 10 pounds. PERFORMANCE STATUS (ECOG):  0 HEENT:  No visual changes, runny nose, sore throat, mouth sores or tenderness. Lungs: No shortness of breath or cough.  No hemoptysis.  CPAP at  night. Cardiac:  No chest pain, palpitations, orthopnea, or PND. GI:  No nausea, vomiting, diarrhea, constipation, melena or hematochezia. GU:  No urgency, frequency, dysuria, or hematuria. Musculoskeletal:  No back pain.  No joint pain.  No muscle tenderness. Extremities:  No pain or swelling. Skin:  No rashes or skin changes. Neuro:  No headache, numbness or weakness, balance or coordination issues. Endocrine:  No diabetes, thyroid issues, hot flashes or night sweats. Psych:  Stress (father, brother, son). Pain:  Right chest wall pain at night. Review of systems:  All other systems reviewed and  found to be negative.   Physical Exam: Blood pressure (!) 151/87, pulse 86, temperature 97.7 F (36.5 C), temperature source Oral, resp. rate 16, weight 209 lb 8.8 oz (95.1 kg), SpO2 100 %. GENERAL:  Well developed, well nourished, woman sitting comfortably in the exam room in no acute distress. MENTAL STATUS:  Alert and oriented to person, place and time. HEAD:  Long brown hair.  Normocephalic, atraumatic, face symmetric, no Cushingoid features. EYES:  Brown eyes.  Pupils equal round and reactive to light and accomodation.  No conjunctivitis or scleral icterus. ENT:  Oropharynx clear without lesion.  Tongue normal.  Upper dentures.  Mucous membranes moist.  RESPIRATORY:  Clear to auscultation without rales, wheezes or rhonchi. CARDIOVASCULAR:  Regular rate and rhythm without murmur, rub or gallop. BREAST:  Right mastectomy without erythema or nodularity.  Left breast with fibrocystic changes medially and inferiorly.  No masses, skin changes or nipple discharge. ABDOMEN:  Soft, non-tender, with active bowel sounds, and no hepatosplenomegaly.  No masses. SKIN:  No rashes, ulcers or lesions. EXTREMITIES: No edema, no skin discoloration or tenderness.  No palpable cords. LYMPH NODES: No palpable cervical, supraclavicular, axillary or inguinal adenopathy  NEUROLOGICAL: Unremarkable. PSYCH:  Appropriate.    Appointment on 04/05/2018  Component Date Value Ref Range Status  . Sodium 04/05/2018 138  135 - 145 mmol/L Final  . Potassium 04/05/2018 3.7  3.5 - 5.1 mmol/L Final  . Chloride 04/05/2018 103  98 - 111 mmol/L Final  . CO2 04/05/2018 26  22 - 32 mmol/L Final  . Glucose, Bld 04/05/2018 111* 70 - 99 mg/dL Final  . BUN 04/05/2018 13  6 - 20 mg/dL Final  . Creatinine, Ser 04/05/2018 0.80  0.44 - 1.00 mg/dL Final  . Calcium 04/05/2018 9.5  8.9 - 10.3 mg/dL Final  . Total Protein 04/05/2018 8.1  6.5 - 8.1 g/dL Final  . Albumin 04/05/2018 4.1  3.5 - 5.0 g/dL Final  . AST 04/05/2018 18  15 -  41 U/L Final  . ALT 04/05/2018 17  0 - 44 U/L Final  . Alkaline Phosphatase 04/05/2018 76  38 - 126 U/L Final  . Total Bilirubin 04/05/2018 0.4  0.3 - 1.2 mg/dL Final  . GFR calc non Af Amer 04/05/2018 >60  >60 mL/min Final  . GFR calc Af Amer 04/05/2018 >60  >60 mL/min Final  . Anion gap 04/05/2018 9  5 - 15 Final   Performed at [facility], [address] [place], [place] [address]  . WBC 04/05/2018 8.4  4.0 - 10.5 K/uL Final  . RBC 04/05/2018 4.44  3.87 - 5.11 MIL/uL Final  . Hemoglobin 04/05/2018 12.4  12.0 - 15.0 g/dL Final  . HCT 04/05/2018 37.5  36.0 - 46.0 % Final  . MCV 04/05/2018 84.5  80.0 - 100.0 fL Final  . MCH 04/05/2018 27.9  26.0 - 34.0 pg Final  . MCHC  04/05/2018 33.1  30.0 - 36.0 g/dL Final  . RDW 04/05/2018 15.0  11.5 - 15.5 % Final  . Platelets 04/05/2018 280  150 - 400 K/uL Final  . nRBC 04/05/2018 0.0  0.0 - 0.2 % Final  . Neutrophils Relative % 04/05/2018 59  % Final  . Neutro Abs 04/05/2018 5.1  1.7 - 7.7 K/uL Final  . Lymphocytes Relative 04/05/2018 31  % Final  . Lymphs Abs 04/05/2018 2.6  0.7 - 4.0 K/uL Final  . Monocytes Relative 04/05/2018 7  % Final  . Monocytes Absolute 04/05/2018 0.5  0.1 - 1.0 K/uL Final  . Eosinophils Relative 04/05/2018 2  % Final  . Eosinophils Absolute 04/05/2018 0.1  0.0 - 0.5 K/uL Final  . Basophils Relative 04/05/2018 1  % Final  . Basophils Absolute 04/05/2018 0.1  0.0 - 0.1 K/uL Final  . Immature Granulocytes 04/05/2018 0  % Final  . Abs Immature Granulocytes 04/05/2018 0.03  0.00 - 0.07 K/uL Final   Performed at [facility], [address], [place], [place] [address]    Assessment:  Jill Walsh is a 48 y.o. 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  No lymph nodes were removed.  Tumor was ER/PR negative and Her2/neu 3+.  Pathologic stage was T10mNx.    Left mammogram and ultrasound on 12/06/2014 revealed no mammographic or  sonographic evidence of malignancy in the left breast.  There was an incidental note of two less than 4 mm benign appearing cysts at the 9:30 o'clock 9 cm and 10 cm from the nipple.  Left screening mammogram on 12/09/2016 revealed no mammographic evidence of malignancy.  Left screening mammogram on 12/12/2017 revealed no evidence of malignancy.  CA27.29 has been followed: 24.4 on 04/30/2014, 18.0 on 11/25/2014, 26.8 on 03/14/2015, 20.6 on 07/14/2015, 19.1 on 12/11/2015, 21.6 on 04/15/2016, 18.6 on 10/18/2016, 17.6 on 03/29/2017, 16.8 on 09/26/2017, and 13.2 on 04/05/2018.  MyRisk genetic testing revealed no clinically significant mutation.  She has a mother who developed breast cancer at age 49  A maternal aunt has colon cancer.    Symptomatically, she notes right breast pain at night.  She has ongoing stressors related to family.  Exam is unremarkable.  Plan: 1.   Labs today: CBC with diff, CMP, CA27.29. 2.   Her2neu+ multi-focal microinvasive right breast cancer:  Clinically she continues to do well.  Exam reveals no evidence of local recurrence.  Left mammogram on 12/12/2017 revealed no evidence of malignancy.  Continue surveillance. 3.   Note for work. 4.   RTC in 6 months for MD assessment and labs (CBC with diff, CMP, CA27.29).   BHonor Loh NP  04/05/2018, 2:35 PM   I saw and evaluated the patient, participating in the key portions of the service and reviewing pertinent diagnostic studies and records.  I reviewed the nurse practitioner's note and agree with the findings and the plan.  The assessment and plan were discussed with the patient.  A few questions were asked by the patient and answered.   MLequita Asal MD  04/05/2018 , 2:35 PM

## 2018-04-06 LAB — CANCER ANTIGEN 27.29: CA 27.29: 13.2 U/mL (ref 0.0–38.6)

## 2018-05-09 ENCOUNTER — Other Ambulatory Visit: Payer: Self-pay

## 2018-05-09 ENCOUNTER — Ambulatory Visit: Payer: 59 | Admitting: Family Medicine

## 2018-05-09 ENCOUNTER — Encounter: Payer: Self-pay | Admitting: Family Medicine

## 2018-05-09 VITALS — BP 120/70 | HR 80 | Ht 65.0 in | Wt 210.0 lb

## 2018-05-09 DIAGNOSIS — J45901 Unspecified asthma with (acute) exacerbation: Secondary | ICD-10-CM

## 2018-05-09 DIAGNOSIS — J209 Acute bronchitis, unspecified: Secondary | ICD-10-CM

## 2018-05-09 MED ORDER — GUAIFENESIN-CODEINE 100-10 MG/5ML PO SYRP: 5.0000 mL | ORAL_SOLUTION | Freq: Four times a day (QID) | ORAL | 0 refills | Status: DC | PRN

## 2018-05-09 MED ORDER — PREDNISONE 10 MG PO TABS: 10.0000 mg | ORAL_TABLET | Freq: Every day | ORAL | 0 refills | Status: DC

## 2018-05-09 MED ORDER — AZITHROMYCIN 250 MG PO TABS: ORAL_TABLET | ORAL | 0 refills | Status: DC

## 2018-05-09 NOTE — Progress Notes (Signed)
Date:  05/09/2018   Name:  Jill Walsh   DOB:  1970-08-26   MRN:  [id number]   Chief Complaint: Allergic Rhinitis  (cough, sneezing, NO FEVER, having to do breathing Tx at home)  Cough  This is a new problem. The current episode started in the past 7 days (Thursday). The problem has been gradually improving. The problem occurs every few minutes. The cough is non-productive. Associated symptoms include wheezing. Pertinent negatives include no chest pain, chills, ear congestion, ear pain, eye redness, fever, headaches, heartburn, hemoptysis, myalgias, nasal congestion, postnasal drip, rash, rhinorrhea, sore throat, shortness of breath, sweats or weight loss. The symptoms are aggravated by pollens. She has tried a beta-agonist inhaler for the symptoms. The treatment provided moderate relief. There is no history of asthma, bronchiectasis, bronchitis, COPD, emphysema, environmental allergies or pneumonia.    Review of Systems  Constitutional: Negative.  Negative for chills, fatigue, fever, unexpected weight change and weight loss.  HENT: Negative for congestion, ear discharge, ear pain, postnasal drip, rhinorrhea, sinus pressure, sneezing and sore throat.   Eyes: Negative for photophobia, pain, discharge, redness and itching.  Respiratory: Positive for cough and wheezing. Negative for hemoptysis, shortness of breath and stridor.   Cardiovascular: Negative for chest pain.  Gastrointestinal: Negative for abdominal pain, blood in stool, constipation, diarrhea, heartburn, nausea and vomiting.  Endocrine: Negative for cold intolerance, heat intolerance, polydipsia, polyphagia and polyuria.  Genitourinary: Negative for dysuria, flank pain, frequency, hematuria, menstrual problem, pelvic pain, urgency, vaginal bleeding and vaginal discharge.  Musculoskeletal: Negative for arthralgias, back pain and myalgias.  Skin: Negative for rash.  Allergic/Immunologic: Negative for environmental allergies and food  allergies.  Neurological: Negative for dizziness, weakness, light-headedness, numbness and headaches.  Hematological: Negative for adenopathy. Does not bruise/bleed easily.  Psychiatric/Behavioral: Negative for dysphoric mood. The patient is not nervous/anxious.     Patient Active Problem List   Diagnosis Date Noted  . Simple chronic bronchitis ([place]) 05/30/2017  . Breast pain 12/20/2014  . Family history of breast cancer 05/28/2014  . Current smoker 05/28/2014  . Breast cancer ([place]) 05/28/2014  . Breast cancer, right breast ([place]) 03/28/2014    No Known Allergies  Past Surgical History:  Procedure Laterality Date  . BREAST BIOPSY Left    02/28/14 negative  . BREAST BIOPSY Right     02/2014 malignant  . MASTECTOMY Right 2016   BREAST CA  . TUBAL LIGATION      Social History   Tobacco Use  . Smoking status: Former Smoker    Years: 15.00  . Smokeless tobacco: Former Network engineer Use Topics  . Alcohol use: No  . Drug use: No     Medication list has been reviewed and updated.  Current Meds  Medication Sig  . albuterol (PROVENTIL HFA;VENTOLIN HFA) 108 (90 Base) MCG/ACT inhaler Inhale 2 puffs into the lungs every 6 (six) hours as needed for wheezing or shortness of breath.  Marland Kitchen ipratropium-albuterol (DUONEB) 0.5-2.5 (3) MG/3ML SOLN Take 3 mLs by nebulization every 6 (six) hours as needed. (Patient taking differently: Take 3 mLs by nebulization at bedtime. )    PHQ 2/9 Scores 08/18/2017 11/08/2016  PHQ - 2 Score 6 0  PHQ- 9 Score 18 0    BP Readings from Last 3 Encounters:  05/09/18 120/70  04/05/18 (!) 151/87  03/23/18 118/80    Physical Exam Vitals signs and nursing note reviewed.  Constitutional:      General: She is not in acute distress.  Appearance: She is not diaphoretic.  HENT:     Head: Normocephalic and atraumatic.     Right Ear: Tympanic membrane, ear canal and external ear normal.     Left Ear: Tympanic membrane, ear canal and external ear normal.      Nose: Nose normal. No congestion or rhinorrhea.     Mouth/Throat:     Mouth: Mucous membranes are moist.     Pharynx: Oropharynx is clear.  Eyes:     General:        Right eye: No discharge.        Left eye: No discharge.     Extraocular Movements: Extraocular movements intact.     Conjunctiva/sclera: Conjunctivae normal.     Pupils: Pupils are equal, round, and reactive to light.  Neck:     Musculoskeletal: Normal range of motion and neck supple.     Thyroid: No thyromegaly.     Vascular: No JVD.  Cardiovascular:     Rate and Rhythm: Normal rate and regular rhythm.     Pulses: Normal pulses.     Heart sounds: Normal heart sounds. No murmur. No friction rub. No gallop.   Pulmonary:     Effort: Pulmonary effort is normal. No respiratory distress.     Breath sounds: Normal breath sounds and air entry. No stridor. No decreased breath sounds, wheezing, rhonchi or rales.  Chest:     Chest wall: No tenderness.  Abdominal:     General: Abdomen is flat. Bowel sounds are normal. There is no distension.     Palpations: Abdomen is soft. There is no mass.     Tenderness: There is no abdominal tenderness. There is no right CVA tenderness, left CVA tenderness, guarding or rebound.     Hernia: No hernia is present.  Musculoskeletal: Normal range of motion.  Lymphadenopathy:     Cervical: No cervical adenopathy.  Skin:    General: Skin is warm and dry.  Neurological:     Mental Status: She is alert.     Deep Tendon Reflexes: Reflexes are normal and symmetric.     Wt Readings from Last 3 Encounters:  05/09/18 210 lb (95.3 kg)  04/05/18 209 lb 8.8 oz (95.1 kg)  03/23/18 211 lb (95.7 kg)    BP 120/70   Pulse 80   Ht 5\' 5"  (1.651 m)   Wt 210 lb (95.3 kg)   SpO2 99%   BMI 34.95 kg/m   Assessment and Plan:  1. Acute bronchitis with asthma with acute exacerbation And has baseline asthma which his had an acute flare as well as likely an acute bronchitis overlapping.  Patient  will be initiated on prednisone once a day for 2 weeks azithromycin 250 mg 2 today followed by 1 a day for 4 days and Robitussin-AC is been given for the cough. - predniSONE (DELTASONE) 10 MG tablet; Take 1 tablet (10 mg total) by mouth daily with breakfast.  Dispense: 30 tablet; Refill: 0 - azithromycin (ZITHROMAX) 250 MG tablet; 2 today then 1 a day for 4 days  Dispense: 6 tablet; Refill: 0

## 2018-05-10 ENCOUNTER — Telehealth: Payer: Self-pay

## 2018-05-10 NOTE — Telephone Encounter (Signed)
-----   Message from Lequita Asal, MD sent at 05/10/2018  2:36 PM EDT ----- Regarding: FW: update of last visit  Will her MD who manages her asthma and sleep apnea write the letter?  M ----- Message ----- From: Secundino Ginger Sent: 05/10/2018   2:20 PM EDT To: Lequita Asal, MD Subject: update of last visit                           She wants to talk to you about putting her out of work since the corona virus is going around. She has asthma and sleeps with breathing machine at night. She is afraid to be out and working.

## 2018-05-10 NOTE — Telephone Encounter (Signed)
Spoke with patient and informed her, per Dr. Mike Gip, a work note would need to come from her MD that is managing her respiratory conditions. Patient verbalizes understanding.

## 2018-06-01 ENCOUNTER — Other Ambulatory Visit: Payer: Self-pay | Admitting: Family Medicine

## 2018-06-01 DIAGNOSIS — J209 Acute bronchitis, unspecified: Secondary | ICD-10-CM

## 2018-06-01 DIAGNOSIS — J45901 Unspecified asthma with (acute) exacerbation: Principal | ICD-10-CM

## 2018-06-16 ENCOUNTER — Encounter: Payer: Self-pay | Admitting: Emergency Medicine

## 2018-06-16 ENCOUNTER — Ambulatory Visit
Admission: EM | Admit: 2018-06-16 | Discharge: 2018-06-16 | Disposition: A | Discharge status: Home / Self Care | Payer: 59 | Attending: Family Medicine | Admitting: Family Medicine

## 2018-06-16 ENCOUNTER — Other Ambulatory Visit: Payer: Self-pay

## 2018-06-16 DIAGNOSIS — H6012 Cellulitis of left external ear: Secondary | ICD-10-CM

## 2018-06-16 MED ORDER — SULFAMETHOXAZOLE-TRIMETHOPRIM 800-160 MG PO TABS: 1.0000 | ORAL_TABLET | Freq: Two times a day (BID) | ORAL | 0 refills | Status: DC

## 2018-06-16 NOTE — ED Triage Notes (Signed)
Pt c/o left ear pain. Started about 3 days ago. She has been wearing a mask and glasses and is irritating her ear. Pain is internal and external.

## 2018-06-16 NOTE — Discharge Instructions (Signed)
Warm compresses to area °

## 2018-06-19 NOTE — ED Provider Notes (Signed)
[facility]    CSN: [id number] Arrival date & time: 06/16/18  1125     History   Chief Complaint Chief Complaint  Patient presents with  . Otalgia    left    HPI Jill Walsh is a 48 y.o. female.   48 yo female with a c/o left ear pain for the past 3 days. Noticed swelling and redness of the earlobe. Denies any fevers, chills or drainage.   The history is provided by the patient.  Otalgia    Past Medical History:  Diagnosis Date  . Breast cancer ([place]) 2016   RT MASTECTOMY    Patient Active Problem List   Diagnosis Date Noted  . Simple chronic bronchitis ([place]) 05/30/2017  . Breast pain 12/20/2014  . Family history of breast cancer 05/28/2014  . Current smoker 05/28/2014  . Breast cancer ([place]) 05/28/2014  . Breast cancer, right breast ([place]) 03/28/2014    Past Surgical History:  Procedure Laterality Date  . BREAST BIOPSY Left    02/28/14 negative  . BREAST BIOPSY Right     02/2014 malignant  . MASTECTOMY Right 2016   BREAST CA  . TUBAL LIGATION      OB History   No obstetric history on file.      Home Medications    Prior to Admission medications   Medication Sig Start Date End Date Taking? Authorizing Provider  albuterol (PROVENTIL HFA;VENTOLIN HFA) 108 (90 Base) MCG/ACT inhaler Inhale 2 puffs into the lungs every 6 (six) hours as needed for wheezing or shortness of breath. 03/23/18  Yes Juline Patch, MD  azithromycin (ZITHROMAX) 250 MG tablet 2 today then 1 a day for 4 days 05/09/18   Juline Patch, MD  guaiFENesin-codeine [facility]) 100-10 MG/5ML syrup Take 5 mLs by mouth 4 (four) times daily as needed for cough. 05/09/18   Juline Patch, MD  ipratropium-albuterol (DUONEB) 0.5-2.5 (3) MG/3ML SOLN Take 3 mLs by nebulization every 6 (six) hours as needed. Patient taking differently: Take 3 mLs by nebulization at bedtime.  03/23/18   Juline Patch, MD  predniSONE (DELTASONE) 10 MG tablet Take 1 tablet (10 mg total) by mouth daily  with breakfast. 05/09/18   Juline Patch, MD  sulfamethoxazole-trimethoprim (BACTRIM DS) 800-160 MG tablet Take 1 tablet by mouth 2 (two) times daily. 06/16/18   Norval Gable, MD    Family History Family History  Problem Relation Age of Onset  . Cancer Mother   . Breast cancer Mother 62  . Stroke Brother   . Cancer Maternal Aunt     Social History Social History   Tobacco Use  . Smoking status: Former Smoker    Years: 15.00  . Smokeless tobacco: Former Network engineer Use Topics  . Alcohol use: No  . Drug use: No     Allergies   Patient has no known allergies.   Review of Systems Review of Systems  HENT: Positive for ear pain.      Physical Exam Triage Vital Signs ED Triage Vitals  Enc Vitals Group     BP 06/16/18 1151 117/82     Pulse Rate 06/16/18 1151 86     Resp 06/16/18 1151 18     Temp 06/16/18 1151 98.1 F (36.7 C)     Temp Source 06/16/18 1151 Oral     SpO2 06/16/18 1151 99 %     Weight 06/16/18 1148 194 lb (88 kg)     Height 06/16/18 1148  5\' 5"  (1.651 m)     Head Circumference --      Peak Flow --      Pain Score 06/16/18 1148 8     Pain Loc --      Pain Edu? --      Excl. in [place]? --    No data found.  Updated Vital Signs BP 117/82 (BP Location: Left Arm)   Pulse 86   Temp 98.1 F (36.7 C) (Oral)   Resp 18   Ht 5\' 5"  (1.651 m)   Wt 88 kg   LMP 06/10/2018 (Approximate)   SpO2 99%   BMI 32.28 kg/m   Visual Acuity Right Eye Distance:   Left Eye Distance:   Bilateral Distance:    Right Eye Near:   Left Eye Near:    Bilateral Near:     Physical Exam Vitals signs reviewed.  Constitutional:      General: She is not in acute distress.    Appearance: She is not toxic-appearing or diaphoretic.  HENT:     Left Ear: Tympanic membrane normal.     Ears:     Comments: Left external ear (earlobe) with edema, warmth, blanchable erythema and tenderness to palpation; no drainage or fluctuance Neurological:     Mental Status: She is  alert.      UC Treatments / Results  Labs (all labs ordered are listed, but only abnormal results are displayed) Labs Reviewed - No data to display  EKG None  Radiology No results found.  Procedures Procedures (including critical care time)  Medications Ordered in UC Medications - No data to display  Initial Impression / Assessment and Plan / UC Course  I have reviewed the triage vital signs and the nursing notes.  Pertinent labs & imaging results that were available during my care of the patient were reviewed by me and considered in my medical decision making (see chart for details).      Final Clinical Impressions(s) / UC Diagnoses   Final diagnoses:  Cellulitis of external ear, left     Discharge Instructions     Warm compresses to area    ED Prescriptions    Medication Sig Dispense Auth. Provider   sulfamethoxazole-trimethoprim (BACTRIM DS) 800-160 MG tablet Take 1 tablet by mouth 2 (two) times daily. 20 tablet Norval Gable, MD     1.diagnosis reviewed with patient 2. rx as per orders above; reviewed possible side effects, interactions, risks and benefits  3. Recommend supportive treatment as above 4. Follow-up prn if symptoms worsen or don't improve   Controlled Substance Prescriptions [place] Controlled Substance Registry consulted? Not Applicable   Norval Gable, MD 06/19/18 [phone]

## 2018-08-01 ENCOUNTER — Other Ambulatory Visit: Payer: Self-pay

## 2018-08-01 ENCOUNTER — Emergency Department: Payer: 59

## 2018-08-01 ENCOUNTER — Encounter: Payer: Self-pay | Admitting: Emergency Medicine

## 2018-08-01 ENCOUNTER — Emergency Department
Admission: EM | Admit: 2018-08-01 | Discharge: 2018-08-01 | Disposition: A | Discharge status: Home / Self Care | Payer: 59 | Attending: Emergency Medicine | Admitting: Emergency Medicine

## 2018-08-01 DIAGNOSIS — Z87891 Personal history of nicotine dependence: Secondary | ICD-10-CM | POA: Insufficient documentation

## 2018-08-01 DIAGNOSIS — R0789 Other chest pain: Secondary | ICD-10-CM | POA: Insufficient documentation

## 2018-08-01 DIAGNOSIS — Z853 Personal history of malignant neoplasm of breast: Secondary | ICD-10-CM | POA: Insufficient documentation

## 2018-08-01 LAB — CBC WITH DIFFERENTIAL/PLATELET
Abs Immature Granulocytes: 0.04 10*3/uL (ref 0.00–0.07)
Basophils Absolute: 0.1 10*3/uL (ref 0.0–0.1)
Basophils Relative: 0 %
Eosinophils Absolute: 0.2 10*3/uL (ref 0.0–0.5)
Eosinophils Relative: 2 %
HCT: 37.4 % (ref 36.0–46.0)
Hemoglobin: 12.2 g/dL (ref 12.0–15.0)
Immature Granulocytes: 0 %
Lymphocytes Relative: 20 %
Lymphs Abs: 2.7 10*3/uL (ref 0.7–4.0)
MCH: 27.5 pg (ref 26.0–34.0)
MCHC: 32.6 g/dL (ref 30.0–36.0)
MCV: 84.4 fL (ref 80.0–100.0)
Monocytes Absolute: 0.9 10*3/uL (ref 0.1–1.0)
Monocytes Relative: 7 %
Neutro Abs: 9.4 10*3/uL — ABNORMAL HIGH (ref 1.7–7.7)
Neutrophils Relative %: 71 %
Platelets: 273 10*3/uL (ref 150–400)
RBC: 4.43 MIL/uL (ref 3.87–5.11)
RDW: 14.5 % (ref 11.5–15.5)
WBC: 13.3 10*3/uL — ABNORMAL HIGH (ref 4.0–10.5)
nRBC: 0 % (ref 0.0–0.2)

## 2018-08-01 LAB — COMPREHENSIVE METABOLIC PANEL
ALT: 25 U/L (ref 0–44)
AST: 22 U/L (ref 15–41)
Albumin: 4.1 g/dL (ref 3.5–5.0)
Alkaline Phosphatase: 90 U/L (ref 38–126)
Anion gap: 8 (ref 5–15)
BUN: 13 mg/dL (ref 6–20)
CO2: 23 mmol/L (ref 22–32)
Calcium: 9.2 mg/dL (ref 8.9–10.3)
Chloride: 108 mmol/L (ref 98–111)
Creatinine, Ser: 0.9 mg/dL (ref 0.44–1.00)
GFR calc Af Amer: >60 mL/min (ref >60)
GFR calc non Af Amer: >60 mL/min (ref >60)
Glucose, Bld: 133 mg/dL — ABNORMAL HIGH (ref 70–99)
Potassium: 3.7 mmol/L (ref 3.5–5.1)
Sodium: 139 mmol/L (ref 135–145)
Total Bilirubin: 0.4 mg/dL (ref 0.3–1.2)
Total Protein: 7.9 g/dL (ref 6.5–8.1)

## 2018-08-01 LAB — TROPONIN I (HIGH SENSITIVITY): Troponin I (High Sensitivity): <2 ng/L (ref <18)

## 2018-08-01 MED ORDER — KETOROLAC TROMETHAMINE 30 MG/ML IJ SOLN
30.0000 mg | Freq: Once | INTRAMUSCULAR | Status: AC
  Administered 2018-08-01: 30 mg via INTRAVENOUS
  Filled 2018-08-01: qty 1

## 2018-08-01 MED ORDER — NAPROXEN 500 MG PO TABS: 500.0000 mg | ORAL_TABLET | Freq: Two times a day (BID) | ORAL | 2 refills | Status: DC

## 2018-08-01 NOTE — ED Notes (Signed)
See triage note. Pt c/o intermittent central chest pain, non-radiating, reproducible to palpation. Pt states she had a nurse neighbor come out last night and check VS, pt tried reflux meds last night without pain relief. States pain is worse when laying on R side and eases off somewhat when laying on L side with pillow in between breasts. +mild SOB with exertion, reports hx asthma.

## 2018-08-01 NOTE — ED Provider Notes (Signed)
[facility] Emergency Department Provider Note   ____________________________________________    I have reviewed the triage vital signs and the nursing notes.   HISTORY  Chief Complaint Chest Pain     HPI Jill Walsh is a 48 y.o. female who presents with complaints of chest pain.  Patient describes right-sided anterior chest pain since yesterday which is been constant.  Worse with movement, pressure to the area.  Denies injury to the area although reports reaching above her head with her right arm may have caused it at work.  No fevers or chills.  No cough.  No nausea vomiting or diaphoresis.  No history of heart disease.  Has not take anything for this.,  Pain is sharp in nature does not radiate  Past Medical History:  Diagnosis Date  . Breast cancer ([place]) 2016   RT MASTECTOMY    Patient Active Problem List   Diagnosis Date Noted  . Simple chronic bronchitis ([place]) 05/30/2017  . Breast pain 12/20/2014  . Family history of breast cancer 05/28/2014  . Current smoker 05/28/2014  . Breast cancer ([place]) 05/28/2014  . Breast cancer, right breast ([place]) 03/28/2014    Past Surgical History:  Procedure Laterality Date  . BREAST BIOPSY Left    02/28/14 negative  . BREAST BIOPSY Right     02/2014 malignant  . MASTECTOMY Right 2016   BREAST CA  . TUBAL LIGATION      Prior to Admission medications   Medication Sig Start Date End Date Taking? Authorizing Provider  albuterol (PROVENTIL HFA;VENTOLIN HFA) 108 (90 Base) MCG/ACT inhaler Inhale 2 puffs into the lungs every 6 (six) hours as needed for wheezing or shortness of breath. 03/23/18  Yes Juline Patch, MD  ibuprofen (ADVIL) 200 MG tablet Take 200 mg by mouth every 6 (six) hours as needed.   Yes [provider]  ipratropium-albuterol (DUONEB) 0.5-2.5 (3) MG/3ML SOLN Take 3 mLs by nebulization every 6 (six) hours as needed. Patient taking differently: Take 3 mLs by nebulization at bedtime.   03/23/18  Yes Juline Patch, MD  naproxen (NAPROSYN) 500 MG tablet Take 1 tablet (500 mg total) by mouth 2 (two) times daily with a meal. 08/01/18   Lavonia Drafts, MD     Allergies Tramadol  Family History  Problem Relation Age of Onset  . Cancer Mother   . Breast cancer Mother 10  . Stroke Brother   . Cancer Maternal Aunt     Social History Social History   Tobacco Use  . Smoking status: Former Smoker    Years: 15.00  . Smokeless tobacco: Former Network engineer Use Topics  . Alcohol use: No  . Drug use: No    Review of Systems  Constitutional: No fever/chills Eyes: No visual changes.  ENT: No sore throat. Cardiovascular: As above Respiratory: Denies shortness of breath. Gastrointestinal: No abdominal pain.    Genitourinary: Negative for dysuria. Musculoskeletal: Negative for back pain. Skin: Negative for rash. Neurological: Negative for headaches    ____________________________________________   PHYSICAL EXAM:  VITAL SIGNS: ED Triage Vitals  Enc Vitals Group     BP 08/01/18 1019 128/82     Pulse Rate 08/01/18 1019 (!) 102     Resp 08/01/18 1019 20     Temp 08/01/18 1019 98.6 F (37 C)     Temp Source 08/01/18 1019 Oral     SpO2 08/01/18 1019 100 %     Weight 08/01/18 1017 92.5 kg (204  lb)     Height 08/01/18 1017 1.651 m (5\' 5" )     Head Circumference --      Peak Flow --      Pain Score 08/01/18 1017 7     Pain Loc --      Pain Edu? --      Excl. in [place]? --     Constitutional: Alert and oriented.  Eyes: Conjunctivae are normal.   Nose: No congestion/rhinnorhea. Mouth/Throat: Mucous membranes are moist.    Cardiovascular: Normal rate, regular rhythm. Grossly normal heart sounds.  Good peripheral circulation.  Significant chest wall tenderness on the right essentially at the midclavicular line third/fourth rib no bruising or rash noted but palpation here mimics her pain Respiratory: Normal respiratory effort.  No retractions. Lungs CTAB.  Gastrointestinal: Soft and nontender. No distention.  No CVA tenderness.  Musculoskeletal: No lower extremity tenderness nor edema.  Warm and well perfused Neurologic:  Normal speech and language. No gross focal neurologic deficits are appreciated.  Skin:  Skin is warm, dry and intact. No rash noted. Psychiatric: Mood and affect are normal. Speech and behavior are normal.  ____________________________________________   LABS (all labs ordered are listed, but only abnormal results are displayed)  Labs Reviewed  CBC WITH DIFFERENTIAL/PLATELET - Abnormal; Notable for the following components:      Result Value   WBC 13.3 (*)    Neutro Abs 9.4 (*)    All other components within normal limits  COMPREHENSIVE METABOLIC PANEL - Abnormal; Notable for the following components:   Glucose, Bld 133 (*)    All other components within normal limits  TROPONIN I (HIGH SENSITIVITY)   ____________________________________________  EKG  ED ECG REPORT I, Lavonia Drafts, the attending physician, personally viewed and interpreted this ECG.  Date: 08/01/2018  Rhythm: normal sinus rhythm QRS Axis: normal Intervals: normal ST/T Wave abnormalities: normal Narrative Interpretation: no evidence of acute ischemia  ____________________________________________  RADIOLOGY  Chest x-ray unremarkable ____________________________________________   PROCEDURES  Procedure(s) performed: No  Procedures   Critical Care performed: No ____________________________________________   INITIAL IMPRESSION / ASSESSMENT AND PLAN / ED COURSE  Pertinent labs & imaging results that were available during my care of the patient were reviewed by me and considered in my medical decision making (see chart for details).  Patient presents with chest pain as described above, exam is most consistent with chest wall pain/muscle injury.  Less likely ACS, EKG is reassuring, pending troponin, chest x-ray normal.  Will treat  with IV Toradol and reevaluate.  Patient significantly improved after IV Toradol, lab work is reassuring, high sensitive troponin less than 2.  We will treat the patient with NSAIDs have her follow-up closely with her PCP, she knows to return if any worsening of her symptoms    ____________________________________________   FINAL CLINICAL IMPRESSION(S) / ED DIAGNOSES  Final diagnoses:  Chest wall pain        Note:  This document was prepared using Dragon voice recognition software and may include unintentional dictation errors.   Lavonia Drafts, MD 08/01/18 [phone]

## 2018-08-01 NOTE — ED Notes (Signed)

## 2018-08-01 NOTE — ED Triage Notes (Signed)
Pt reports CP that is tight in nature and intermittent since last pm. Denies SOB, pain that radiates or nausea.

## 2018-10-04 ENCOUNTER — Other Ambulatory Visit: Payer: 59

## 2018-10-04 ENCOUNTER — Ambulatory Visit: Payer: 59 | Admitting: Hematology and Oncology

## 2018-10-20 NOTE — Progress Notes (Signed)
Called patient no answer left message to call back for prescreening and pre check in

## 2018-10-23 ENCOUNTER — Encounter: Payer: Self-pay | Admitting: Hematology and Oncology

## 2018-10-23 ENCOUNTER — Inpatient Hospital Stay: Payer: 59 | Attending: Hematology and Oncology

## 2018-10-23 ENCOUNTER — Other Ambulatory Visit: Payer: Self-pay

## 2018-10-23 ENCOUNTER — Inpatient Hospital Stay (HOSPITAL_BASED_OUTPATIENT_CLINIC_OR_DEPARTMENT_OTHER): Payer: 59 | Admitting: Hematology and Oncology

## 2018-10-23 VITALS — BP 125/97 | HR 85 | Temp 96.3°F | Resp 18 | Ht 65.0 in | Wt 206.8 lb

## 2018-10-23 DIAGNOSIS — Z803 Family history of malignant neoplasm of breast: Secondary | ICD-10-CM | POA: Insufficient documentation

## 2018-10-23 DIAGNOSIS — C50911 Malignant neoplasm of unspecified site of right female breast: Secondary | ICD-10-CM | POA: Diagnosis not present

## 2018-10-23 DIAGNOSIS — Z888 Allergy status to other drugs, medicaments and biological substances status: Secondary | ICD-10-CM | POA: Insufficient documentation

## 2018-10-23 DIAGNOSIS — N644 Mastodynia: Secondary | ICD-10-CM | POA: Diagnosis not present

## 2018-10-23 DIAGNOSIS — Z0000 Encounter for general adult medical examination without abnormal findings: Secondary | ICD-10-CM

## 2018-10-23 DIAGNOSIS — Z87891 Personal history of nicotine dependence: Secondary | ICD-10-CM | POA: Diagnosis not present

## 2018-10-23 DIAGNOSIS — Z800 Family history of malignant neoplasm of digestive organs: Secondary | ICD-10-CM

## 2018-10-23 DIAGNOSIS — Z171 Estrogen receptor negative status [ER-]: Secondary | ICD-10-CM | POA: Diagnosis not present

## 2018-10-23 DIAGNOSIS — Z9011 Acquired absence of right breast and nipple: Secondary | ICD-10-CM | POA: Diagnosis not present

## 2018-10-23 DIAGNOSIS — L732 Hidradenitis suppurativa: Secondary | ICD-10-CM | POA: Insufficient documentation

## 2018-10-23 DIAGNOSIS — Z885 Allergy status to narcotic agent status: Secondary | ICD-10-CM | POA: Insufficient documentation

## 2018-10-23 DIAGNOSIS — R5383 Other fatigue: Secondary | ICD-10-CM | POA: Diagnosis not present

## 2018-10-23 DIAGNOSIS — Z823 Family history of stroke: Secondary | ICD-10-CM | POA: Insufficient documentation

## 2018-10-23 DIAGNOSIS — Z79899 Other long term (current) drug therapy: Secondary | ICD-10-CM | POA: Diagnosis not present

## 2018-10-23 LAB — COMPREHENSIVE METABOLIC PANEL
ALT: 30 U/L (ref 0–44)
AST: 21 U/L (ref 15–41)
Albumin: 3.9 g/dL (ref 3.5–5.0)
Alkaline Phosphatase: 101 U/L (ref 38–126)
Anion gap: 9 (ref 5–15)
BUN: 14 mg/dL (ref 6–20)
CO2: 24 mmol/L (ref 22–32)
Calcium: 9.4 mg/dL (ref 8.9–10.3)
Chloride: 105 mmol/L (ref 98–111)
Creatinine, Ser: 0.83 mg/dL (ref 0.44–1.00)
GFR calc Af Amer: >60 mL/min (ref >60)
GFR calc non Af Amer: >60 mL/min (ref >60)
Glucose, Bld: 100 mg/dL — ABNORMAL HIGH (ref 70–99)
Potassium: 3.8 mmol/L (ref 3.5–5.1)
Sodium: 138 mmol/L (ref 135–145)
Total Bilirubin: 0.5 mg/dL (ref 0.3–1.2)
Total Protein: 7.8 g/dL (ref 6.5–8.1)

## 2018-10-23 LAB — CBC WITH DIFFERENTIAL/PLATELET
Abs Immature Granulocytes: 0.02 10*3/uL (ref 0.00–0.07)
Basophils Absolute: 0 10*3/uL (ref 0.0–0.1)
Basophils Relative: 0 %
Eosinophils Absolute: 0.2 10*3/uL (ref 0.0–0.5)
Eosinophils Relative: 2 %
HCT: 38.8 % (ref 36.0–46.0)
Hemoglobin: 12.6 g/dL (ref 12.0–15.0)
Immature Granulocytes: 0 %
Lymphocytes Relative: 35 %
Lymphs Abs: 3.2 10*3/uL (ref 0.7–4.0)
MCH: 27.3 pg (ref 26.0–34.0)
MCHC: 32.5 g/dL (ref 30.0–36.0)
MCV: 84.2 fL (ref 80.0–100.0)
Monocytes Absolute: 0.7 10*3/uL (ref 0.1–1.0)
Monocytes Relative: 7 %
Neutro Abs: 5 10*3/uL (ref 1.7–7.7)
Neutrophils Relative %: 56 %
Platelets: 256 10*3/uL (ref 150–400)
RBC: 4.61 MIL/uL (ref 3.87–5.11)
RDW: 14.9 % (ref 11.5–15.5)
WBC: 9.2 10*3/uL (ref 4.0–10.5)
nRBC: 0 % (ref 0.0–0.2)

## 2018-10-23 NOTE — Progress Notes (Signed)
[facility]  [address], [address] [place], [place] [address] Phone: [phone]  Fax: [phone]   Clinic Day:  10/23/2018  Referring physician: Juline Patch, MD  Chief Complaint: Jill Walsh is a 48 y.o. 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 At that time, she noted right breast pain at night.  She had ongoing stressors related to family.  Exam was unremarkable.  CBC and CMP were normal.  CA27.29 was 13.2.  During the interim, she notes soreness under her arm.  She has no new breast concerns. She has some fatigue due to work.  She is working 6-7 days a week (12 hours/day).  Her brother passed away from stage IV colon cancer at 55 years old. Her two aunts were diagnosed with colon cancer.   She is due for mammography in 12/2018.    Past Medical History:  Diagnosis Date  . Breast cancer ([place]) 2016   RT MASTECTOMY    Past Surgical History:  Procedure Laterality Date  . BREAST BIOPSY Left    02/28/14 negative  . BREAST BIOPSY Right     02/2014 malignant  . MASTECTOMY Right 2016   BREAST CA  . TUBAL LIGATION      Family History  Problem Relation Age of Onset  . Cancer Mother   . Breast cancer Mother 49  . Stroke Brother   . Cancer Maternal Aunt     Social History:  reports that she has quit smoking. She quit after 15.00 years of use. She has quit using smokeless tobacco. She reports that she does not drink alcohol or use drugs. 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 She recently sold a home and is in the process of building a new one. The patient is alone today.  Allergies:  Allergies  Allergen Reactions  . Tramadol Other (See Comments)    Real bad headache    Current Medications: Current Outpatient Medications   Medication Sig Dispense Refill  . albuterol (PROVENTIL HFA;VENTOLIN HFA) 108 (90 Base) MCG/ACT inhaler Inhale 2 puffs into the lungs every 6 (six) hours as needed for wheezing or shortness of breath. 1 Inhaler 0  . ibuprofen (ADVIL) 200 MG tablet Take 200 mg by mouth every 6 (six) hours as needed.    Marland Kitchen ipratropium-albuterol (DUONEB) 0.5-2.5 (3) MG/3ML SOLN Take 3 mLs by nebulization every 6 (six) hours as needed. 360 mL 1  . naproxen (NAPROSYN) 500 MG tablet Take 1 tablet (500 mg total) by mouth 2 (two) times daily with a meal. 20 tablet 2   No current facility-administered medications for this visit.     Review of Systems  Constitutional: Positive for weight loss (down 3 lbs). Negative for chills, diaphoresis, fever and malaise/fatigue.       Fatigue from working long hours.  HENT: Negative for congestion and sore throat.   Eyes: Negative.  Negative for blurred vision, double vision, photophobia and pain.  Respiratory: Negative.  Negative for cough, hemoptysis, sputum production and shortness of breath.   Cardiovascular: Negative.  Negative for chest pain, palpitations, orthopnea and PND.  Gastrointestinal: Negative.  Negative for blood in stool, constipation, diarrhea, melena, nausea and vomiting.  Genitourinary: Negative.  Negative for dysuria, frequency, hematuria and urgency.  Musculoskeletal: Negative.  Negative for  back pain, joint pain and myalgias.  Skin: Negative for rash.       Axillary rash- hidradenitis suppurtiva.  Neurological: Negative.  Negative for dizziness, sensory change, speech change, focal weakness, weakness and headaches.  Endo/Heme/Allergies: Negative.   Psychiatric/Behavioral: The patient is nervous/anxious (social stress from loss of brother).    Performance status (ECOG): 1  Vitals Blood pressure (!) 125/97, pulse 85, temperature (!) 96.3 F (35.7 C), temperature source Tympanic, resp. rate 18, height 5' 5"  (1.651 m), weight 206 lb 12.7 oz (93.8 kg), SpO2  100 %.   Physical Exam  Constitutional: She is oriented to person, place, and time. She appears well-developed and well-nourished. No distress. Face mask in place.  HENT:  Head: Normocephalic and atraumatic.  Right Ear: Hearing normal.  Left Ear: Hearing normal.  Nose: Nose normal.  Mouth/Throat: Oropharynx is clear and moist. Mucous membranes are dry. She has dentures (upper). No oral lesions. No oropharyngeal exudate.  Long curly brown hair.  Eyes: Pupils are equal, round, and reactive to light. Conjunctivae and EOM are normal.  Brown eyes.   Neck: No JVD present.  Cardiovascular: Normal rate, regular rhythm and normal heart sounds. Exam reveals no gallop and no friction rub.  No murmur heard. Pulmonary/Chest: Effort normal and breath sounds normal. She has no wheezes. She has no rhonchi. She has no rales. She exhibits no mass, no tenderness and no edema. Right breast exhibits no tenderness. Left breast exhibits no nipple discharge.  Right mastectomy  Abdominal: Soft. Normal appearance and bowel sounds are normal. She exhibits no distension and no mass. There is no hepatosplenomegaly. There is no abdominal tenderness. There is no rebound, no guarding and no CVA tenderness.  Musculoskeletal: Normal range of motion.        General: No edema.  Lymphadenopathy:    She has no cervical adenopathy.       Right cervical: No superficial cervical adenopathy present.   She has no axillary adenopathy.       Right: No inguinal adenopathy present.       Left: No inguinal adenopathy present.  Neurological: She is alert and oriented to person, place, and time.  Skin: Skin is warm, dry and intact. No bruising, no lesion and no rash noted. No erythema. No pallor.  Left axillary hidradenitis suppurativa.  Psychiatric: She has a normal mood and affect. Her behavior is normal. Judgment and thought content normal.    Appointment on 10/23/2018  Component Date Value Ref Range Status  . CA 27.29  10/23/2018 13.1  0.0 - 38.6 U/mL Final   Comment: (NOTE) Siemens Centaur Immunochemiluminometric Methodology [facility]) Values obtained with different assay methods or kits cannot be used interchangeably. Results cannot be interpreted as absolute evidence of the presence or absence of malignant disease. Performed At: [facility] [place], [place] [id number] Rush Farmer MD WU:[id number]   . Sodium 10/23/2018 138  135 - 145 mmol/L Final  . Potassium 10/23/2018 3.8  3.5 - 5.1 mmol/L Final  . Chloride 10/23/2018 105  98 - 111 mmol/L Final  . CO2 10/23/2018 24  22 - 32 mmol/L Final  . Glucose, Bld 10/23/2018 100* 70 - 99 mg/dL Final  . BUN 10/23/2018 14  6 - 20 mg/dL Final  . Creatinine, Ser 10/23/2018 0.83  0.44 - 1.00 mg/dL Final  . Calcium 10/23/2018 9.4  8.9 - 10.3 mg/dL Final  . Total Protein 10/23/2018 7.8  6.5 - 8.1 g/dL Final  . Albumin 10/23/2018 3.9  3.5 - 5.0 g/dL Final  . AST 10/23/2018 21  15 - 41 U/L Final  . ALT 10/23/2018 30  0 - 44 U/L Final  . Alkaline Phosphatase 10/23/2018 101  38 - 126 U/L Final  . Total Bilirubin 10/23/2018 0.5  0.3 - 1.2 mg/dL Final  . GFR calc non Af Amer 10/23/2018 >60  >60 mL/min Final  . GFR calc Af Amer 10/23/2018 >60  >60 mL/min Final  . Anion gap 10/23/2018 9  5 - 15 Final   Performed at [facility], [address] [place], [place] [address]  . WBC 10/23/2018 9.2  4.0 - 10.5 K/uL Final  . RBC 10/23/2018 4.61  3.87 - 5.11 MIL/uL Final  . Hemoglobin 10/23/2018 12.6  12.0 - 15.0 g/dL Final  . HCT 10/23/2018 38.8  36.0 - 46.0 % Final  . MCV 10/23/2018 84.2  80.0 - 100.0 fL Final  . MCH 10/23/2018 27.3  26.0 - 34.0 pg Final  . MCHC 10/23/2018 32.5  30.0 - 36.0 g/dL Final  . RDW 10/23/2018 14.9  11.5 - 15.5 % Final  . Platelets 10/23/2018 256  150 - 400 K/uL Final  . nRBC 10/23/2018 0.0  0.0 - 0.2 % Final  . Neutrophils Relative % 10/23/2018 56  % Final  . Neutro Abs 10/23/2018 5.0  1.7 - 7.7 K/uL Final   . Lymphocytes Relative 10/23/2018 35  % Final  . Lymphs Abs 10/23/2018 3.2  0.7 - 4.0 K/uL Final  . Monocytes Relative 10/23/2018 7  % Final  . Monocytes Absolute 10/23/2018 0.7  0.1 - 1.0 K/uL Final  . Eosinophils Relative 10/23/2018 2  % Final  . Eosinophils Absolute 10/23/2018 0.2  0.0 - 0.5 K/uL Final  . Basophils Relative 10/23/2018 0  % Final  . Basophils Absolute 10/23/2018 0.0  0.0 - 0.1 K/uL Final  . Immature Granulocytes 10/23/2018 0  % Final  . Abs Immature Granulocytes 10/23/2018 0.02  0.00 - 0.07 K/uL Final   Performed at [facility], [address], [place], [place] [address]    Assessment:  Taffy Delconte is a 48 y.o. 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  No lymph nodes were removed.  Tumor was ER/PR negative and Her2/neu 3+.  Pathologic stage was T81mNx.    Left mammogram and ultrasound on 12/06/2014 revealed no mammographic or sonographic evidence of malignancy in the left breast.  There was an incidental note of two less than 4 mm benign appearing cysts at the 9:30 o'clock 9 cm and 10 cm from the nipple.  Left screening mammogram on 12/09/2016 revealed no mammographic evidence of malignancy.  Left screening mammogram on 12/12/2017 revealed no evidence of malignancy.  CA27.29 has been followed: 24.4 on 04/30/2014, 18.0 on 11/25/2014, 26.8 on 03/14/2015, 20.6 on 07/14/2015, 19.1 on 12/11/2015, 21.6 on 04/15/2016, 18.6 on 10/18/2016, 17.6 on 03/29/2017, 16.8 on 09/26/2017, 13.2 on 04/05/2018, and 13.1 on 10/23/2018.  MyRisk genetic testing revealed no clinically significant mutation.  She has a mother who developed breast cancer at age 49  A maternal aunt has colon cancer.    Symptomatically, she is doing well.  She has left axillary hidradenitis suppurativa.  Plan: 1.   Labs today: CBC with diff, CMP, CA27.29. 2.   Her2neu+ multi-focal microinvasive right breast cancer:              Clinically, she is doing well.  Exam reveals no evidence of recurrence.  Schedule  mammogram on 12/14/2018.             Continue surveillance. 3.   Family history of colon cancer  Patient has a significant family history.  Patient's brother recently died at age 33.  She has 2 aunts with colon cancer.  Discuss referral to GI for screening colonoscopy. 4.   Hidradenitis suppurativa  Follow-up with Dr Ronnald Ramp. 5.   Note for absence from work. 6.   RTC in 6 months for MD assessment, labs (CBC with diff, CMP, CA27.29), and review of mammogram.  I discussed the assessment and treatment plan with the patient.  The patient was provided an opportunity to ask questions and all were answered.  The patient agreed with the plan and demonstrated an understanding of the instructions.  The patient was advised to call back if the symptoms worsen or if the condition fails to improve as anticipated.   Lequita Asal, MD, PhD    10/23/2018, 4:35 PM  I, Jacqualyn Posey, am acting as Education administrator for [employer]. Mike Gip, MD, PhD.  I, Melissa C. Mike Gip, MD, have reviewed the above documentation for accuracy and completeness, and I agree with the above.

## 2018-10-23 NOTE — Progress Notes (Signed)
Patient c/o little soreness noted to right axillar.

## 2018-10-24 LAB — CANCER ANTIGEN 27.29: CA 27.29: 13.1 U/mL (ref 0.0–38.6)

## 2018-10-30 ENCOUNTER — Telehealth: Payer: Self-pay

## 2018-10-30 NOTE — Telephone Encounter (Signed)
Returned patients call to schedule colonoscopy, however her voicemail was full-unable to contact.  Thanks [employer]

## 2018-11-01 ENCOUNTER — Telehealth: Payer: Self-pay

## 2018-11-01 ENCOUNTER — Other Ambulatory Visit: Payer: Self-pay

## 2018-11-01 DIAGNOSIS — Z1211 Encounter for screening for malignant neoplasm of colon: Secondary | ICD-10-CM

## 2018-11-01 DIAGNOSIS — Z800 Family history of malignant neoplasm of digestive organs: Secondary | ICD-10-CM

## 2018-11-01 MED ORDER — NA SULFATE-K SULFATE-MG SULF 17.5-3.13-1.6 GM/177ML PO SOLN: 1.0000 | Freq: Once | ORAL | 0 refills | Status: AC

## 2018-11-01 NOTE — Telephone Encounter (Signed)
Gastroenterology Pre-Procedure Review  Request Date: 11/13/18 Requesting Physician: Dr. Vicente Males  PATIENT REVIEW QUESTIONS: The patient responded to the following health history questions as indicated:    1. Are you having any GI issues? no 2. Do you have a personal history of Polyps? no 3. Do you have a family history of Colon Cancer or Polyps? yes (two aunts had colon cancer) 4. Diabetes Mellitus? no 5. Joint replacements in the past 12 months?no 6. Major health problems in the past 3 months?no 7. Any artificial heart valves, MVP, or defibrillator?no    MEDICATIONS & ALLERGIES:    Patient reports the following regarding taking any anticoagulation/antiplatelet therapy:   Plavix, Coumadin, Eliquis, Xarelto, Lovenox, Pradaxa, Brilinta, or Effient? no Aspirin? no  Patient confirms/reports the following medications:  Current Outpatient Medications  Medication Sig Dispense Refill  . albuterol (PROVENTIL HFA;VENTOLIN HFA) 108 (90 Base) MCG/ACT inhaler Inhale 2 puffs into the lungs every 6 (six) hours as needed for wheezing or shortness of breath. 1 Inhaler 0  . ibuprofen (ADVIL) 200 MG tablet Take 200 mg by mouth every 6 (six) hours as needed.    Marland Kitchen ipratropium-albuterol (DUONEB) 0.5-2.5 (3) MG/3ML SOLN Take 3 mLs by nebulization every 6 (six) hours as needed. 360 mL 1  . naproxen (NAPROSYN) 500 MG tablet Take 1 tablet (500 mg total) by mouth 2 (two) times daily with a meal. 20 tablet 2   No current facility-administered medications for this visit.     Patient confirms/reports the following allergies:  Allergies  Allergen Reactions  . Tramadol Other (See Comments)    Real bad headache    No orders of the defined types were placed in this encounter.   AUTHORIZATION INFORMATION Primary Insurance: 1D#: Group #:  Secondary Insurance: 1D#: Group #:  SCHEDULE INFORMATION: Date: 11/13/18 Time: Location:[facility]

## 2018-11-09 ENCOUNTER — Other Ambulatory Visit
Admission: RE | Admit: 2018-11-09 | Discharge: 2018-11-09 | Disposition: A | Discharge status: Home / Self Care | Payer: 59 | Source: Ambulatory Visit | Attending: Gastroenterology | Admitting: Gastroenterology

## 2018-11-09 ENCOUNTER — Other Ambulatory Visit: Payer: Self-pay

## 2018-11-09 DIAGNOSIS — Z01812 Encounter for preprocedural laboratory examination: Secondary | ICD-10-CM | POA: Diagnosis not present

## 2018-11-09 DIAGNOSIS — Z20828 Contact with and (suspected) exposure to other viral communicable diseases: Secondary | ICD-10-CM | POA: Insufficient documentation

## 2018-11-09 LAB — SARS CORONAVIRUS 2 (TAT 6-24 HRS): SARS Coronavirus 2: NEGATIVE

## 2018-11-12 ENCOUNTER — Encounter: Payer: Self-pay | Admitting: *Deleted

## 2018-11-12 NOTE — Patient Instructions (Signed)
FMLA forms have been completed for patient. Forms have been scanned to Dr. Drenda Freeze clinic team for MD signature and notification to patient that forms are ready to be picked up.

## 2018-11-13 ENCOUNTER — Ambulatory Visit: Payer: 59 | Admitting: Certified Registered Nurse Anesthetist

## 2018-11-13 ENCOUNTER — Encounter
Admission: RE | Disposition: A | Discharge status: Home / Self Care | Payer: Self-pay | Source: Ambulatory Visit | Attending: Gastroenterology

## 2018-11-13 ENCOUNTER — Encounter: Payer: Self-pay | Admitting: *Deleted

## 2018-11-13 ENCOUNTER — Ambulatory Visit
Admission: RE | Admit: 2018-11-13 | Discharge: 2018-11-13 | Disposition: A | Discharge status: Home / Self Care | Payer: 59 | Source: Ambulatory Visit | Attending: Gastroenterology | Admitting: Gastroenterology

## 2018-11-13 DIAGNOSIS — Z791 Long term (current) use of non-steroidal anti-inflammatories (NSAID): Secondary | ICD-10-CM | POA: Diagnosis not present

## 2018-11-13 DIAGNOSIS — Z87891 Personal history of nicotine dependence: Secondary | ICD-10-CM | POA: Diagnosis not present

## 2018-11-13 DIAGNOSIS — Z853 Personal history of malignant neoplasm of breast: Secondary | ICD-10-CM | POA: Diagnosis not present

## 2018-11-13 DIAGNOSIS — Z800 Family history of malignant neoplasm of digestive organs: Secondary | ICD-10-CM | POA: Insufficient documentation

## 2018-11-13 DIAGNOSIS — J45909 Unspecified asthma, uncomplicated: Secondary | ICD-10-CM | POA: Diagnosis not present

## 2018-11-13 DIAGNOSIS — Z1211 Encounter for screening for malignant neoplasm of colon: Secondary | ICD-10-CM | POA: Insufficient documentation

## 2018-11-13 HISTORY — PX: COLONOSCOPY WITH PROPOFOL: SHX5780

## 2018-11-13 LAB — POCT PREGNANCY, URINE: Preg Test, Ur: NEGATIVE

## 2018-11-13 SURGERY — COLONOSCOPY WITH PROPOFOL
Anesthesia: General

## 2018-11-13 MED ORDER — LIDOCAINE HCL (PF) 2 % IJ SOLN
INTRAMUSCULAR | Status: AC
  Filled 2018-11-13: qty 10

## 2018-11-13 MED ORDER — PROPOFOL 10 MG/ML IV BOLUS
INTRAVENOUS | Status: DC | PRN
  Administered 2018-11-13: 60 mg via INTRAVENOUS

## 2018-11-13 MED ORDER — SODIUM CHLORIDE 0.9 % IV SOLN
INTRAVENOUS | Status: DC
  Administered 2018-11-13: 1000 mL via INTRAVENOUS

## 2018-11-13 MED ORDER — PROPOFOL 500 MG/50ML IV EMUL
INTRAVENOUS | Status: DC | PRN
  Administered 2018-11-13: 150 ug/kg/min via INTRAVENOUS

## 2018-11-13 MED ORDER — PHENYLEPHRINE HCL (PRESSORS) 10 MG/ML IV SOLN
INTRAVENOUS | Status: DC | PRN
  Administered 2018-11-13: 100 ug via INTRAVENOUS

## 2018-11-13 MED ORDER — PROPOFOL 10 MG/ML IV BOLUS
INTRAVENOUS | Status: AC
  Filled 2018-11-13: qty 20

## 2018-11-13 MED ORDER — LIDOCAINE HCL (CARDIAC) PF 100 MG/5ML IV SOSY
PREFILLED_SYRINGE | INTRAVENOUS | Status: DC | PRN
  Administered 2018-11-13: 50 mg via INTRATRACHEAL

## 2018-11-13 NOTE — Op Note (Signed)
[facility] Gastroenterology Patient Name: Jill Walsh Procedure Date: 11/13/2018 10:47 AM MRN: [id number] Account #: [id number] Date of Birth: 07-19-1970 Admit Type: Outpatient Age: 48 Room: [facility] ENDO ROOM 4 Gender: Female Note Status: Finalized Procedure:            Colonoscopy Indications:          Screening in patient at increased risk: Family history                        of 1st-degree relative with colorectal cancer before                        age 56 years Providers:            Jonathon Bellows MD, MD Referring MD:         Juline Patch, MD (Referring MD) Medicines:            Monitored Anesthesia Care Complications:        No immediate complications. Procedure:            Pre-Anesthesia Assessment:                       - Prior to the procedure, a History and Physical was                        performed, and patient medications, allergies and                        sensitivities were reviewed. The patient's tolerance of                        previous anesthesia was reviewed.                       - The risks and benefits of the procedure and the                        sedation options and risks were discussed with the                        patient. All questions were answered and informed                        consent was obtained.                       - ASA Grade Assessment: II - A patient with mild                        systemic disease.                       After obtaining informed consent, the colonoscope was                        passed under direct vision. Throughout the procedure,                        the patient's blood pressure, pulse, and oxygen  saturations were monitored continuously. The                        Colonoscope was introduced through the anus and                        advanced to the the cecum, identified by the                        appendiceal orifice. The colonoscopy was performed with                 ease. The patient tolerated the procedure well. The                        quality of the bowel preparation was excellent. Findings:      The perianal and digital rectal examinations were normal.      The entire examined colon appeared normal on direct and retroflexion       views. Impression:           - The entire examined colon is normal on direct and                        retroflexion views.                       - No specimens collected. Recommendation:       - Discharge patient to home (with escort).                       - Resume previous diet.                       - Continue present medications.                       - Repeat colonoscopy in 5 years for screening purposes. Procedure Code(s):    --- Professional ---                       [phone], Colonoscopy, flexible; diagnostic, including                        collection of specimen(s) by brushing or washing, when                        performed (separate procedure) Diagnosis Code(s):    --- Professional ---                       Z80.0, Family history of malignant neoplasm of                        digestive organs CPT copyright 2019 American Medical Association. All rights reserved. The codes documented in this report are preliminary and upon coder review may  be revised to meet current compliance requirements. Jonathon Bellows, MD Jonathon Bellows MD, MD 11/13/2018 11:09:58 AM This report has been signed electronically. Number of Addenda: 0 Note Initiated On: 11/13/2018 10:47 AM Scope Withdrawal Time: 0 hours 7 minutes 34 seconds  Total Procedure Duration: 0 hours 10 minutes 34 seconds  Estimated Blood Loss: Estimated blood loss: none.      [facility]

## 2018-11-13 NOTE — Anesthesia Postprocedure Evaluation (Signed)
Anesthesia Post Note  Patient: Jill Walsh  Procedure(s) Performed: COLONOSCOPY WITH PROPOFOL (N/A )  Patient location during evaluation: Endoscopy Anesthesia Type: General Level of consciousness: awake and alert Pain management: pain level controlled Vital Signs Assessment: post-procedure vital signs reviewed and stable Respiratory status: spontaneous breathing and respiratory function stable Cardiovascular status: stable Anesthetic complications: no     Last Vitals:  Vitals:   11/13/18 1122 11/13/18 1132  BP: 115/86 110/88  Pulse: 75 79  Resp: 19 16  Temp:    SpO2: 100% 100%    Last Pain:  Vitals:   11/13/18 1132  TempSrc:   PainSc: 0-No pain                 KEPHART,WILLIAM K

## 2018-11-13 NOTE — H&P (Signed)
Jonathon Bellows, MD [address] [place], [place], [place], [address] [address], [place], [place], [place], [address] Phone: [phone]  Fax: [phone]  Primary Care Physician:  Juline Patch, MD   Pre-Procedure History & Physical: HPI:  Jill Walsh is a 48 y.o. female is here for an colonoscopy.   Past Medical History:  Diagnosis Date  . Breast cancer ([place]) 2016   RT MASTECTOMY    Past Surgical History:  Procedure Laterality Date  . BREAST BIOPSY Left    02/28/14 negative  . BREAST BIOPSY Right     02/2014 malignant  . MASTECTOMY Right 2016   BREAST CA  . TUBAL LIGATION      Prior to Admission medications   Medication Sig Start Date End Date Taking? Authorizing Provider  albuterol (PROVENTIL HFA;VENTOLIN HFA) 108 (90 Base) MCG/ACT inhaler Inhale 2 puffs into the lungs every 6 (six) hours as needed for wheezing or shortness of breath. 03/23/18   Juline Patch, MD  ibuprofen (ADVIL) 200 MG tablet Take 200 mg by mouth every 6 (six) hours as needed.    [provider]  ipratropium-albuterol (DUONEB) 0.5-2.5 (3) MG/3ML SOLN Take 3 mLs by nebulization every 6 (six) hours as needed. 03/23/18   Juline Patch, MD  naproxen (NAPROSYN) 500 MG tablet Take 1 tablet (500 mg total) by mouth 2 (two) times daily with a meal. 08/01/18   Lavonia Drafts, MD    Allergies as of 11/01/2018 - Review Complete 10/23/2018  Allergen Reaction Noted  . Tramadol Other (See Comments) 08/01/2018    Family History  Problem Relation Age of Onset  . Cancer Mother   . Breast cancer Mother 69  . Stroke Brother   . Cancer Maternal Aunt     Social History   Socioeconomic History  . Marital status: Single    Spouse name: Not on file  . Number of children: Not on file  . Years of education: Not on file  . Highest education level: Not on file  Occupational History  . Not on file  Social Needs  . Financial resource strain: Not on file  . Food insecurity    Worry:  Patient refused    Inability: Patient refused  . Transportation needs    Medical: Patient refused    Non-medical: Patient refused  Tobacco Use  . Smoking status: Former Smoker    Years: 15.00  . Smokeless tobacco: Former Network engineer and Sexual Activity  . Alcohol use: No  . Drug use: No  . Sexual activity: Not on file  Lifestyle  . Physical activity    Days per week: Patient refused    Minutes per session: Patient refused  . Stress: Not on file  Relationships  . Social Herbalist on phone: Patient refused    Gets together: Patient refused    Attends religious service: Patient refused    Active member of club or organization: Patient refused    Attends meetings of clubs or organizations: Patient refused    Relationship status: Patient refused  . Intimate partner violence    Fear of current or ex partner: Patient refused    Emotionally abused: Patient refused    Physically abused: Patient refused    Forced sexual activity: Patient refused  Other Topics Concern  . Not on file  Social History Narrative  . Not on file    Review of Systems: See HPI, otherwise negative ROS  Physical Exam:  BP (!) 136/93   Pulse 97   Temp (!) 97.5 F (36.4 C) (Tympanic)   Resp 16   Ht 5\' 5"  (1.651 m)   Wt 89.4 kg   SpO2 100%   BMI 32.78 kg/m  General:   Alert,  pleasant and cooperative in NAD Head:  Normocephalic and atraumatic. Neck:  Supple; no masses or thyromegaly. Lungs:  Clear throughout to auscultation, normal respiratory effort.    Heart:  +S1, +S2, Regular rate and rhythm, No edema. Abdomen:  Soft, nontender and nondistended. Normal bowel sounds, without guarding, and without rebound.   Neurologic:  Alert and  oriented x4;  grossly normal neurologically.  Impression/Plan: Jill Walsh is here for an colonoscopy to be performed for Screening colonoscopy brother had colon cancer at age 74 Risks, benefits, limitations, and alternatives regarding  colonoscopy  have been reviewed with the patient.  Questions have been answered.  All parties agreeable.   Jonathon Bellows, MD  11/13/2018, 10:40 AM

## 2018-11-13 NOTE — Anesthesia Post-op Follow-up Note (Signed)
Anesthesia QCDR form completed.        

## 2018-11-13 NOTE — Transfer of Care (Signed)
Immediate Anesthesia Transfer of Care Note  Patient: Jill Walsh  Procedure(s) Performed: COLONOSCOPY WITH PROPOFOL (N/A )  Patient Location: PACU  Anesthesia Type:General  Level of Consciousness: awake, alert  and oriented  Airway & Oxygen Therapy: Patient Spontanous Breathing  Post-op Assessment: Report given to RN, Post -op Vital signs reviewed and stable and Patient moving all extremities X 4  Post vital signs: Reviewed and stable  Last Vitals:  Vitals Value Taken Time  BP 108/71 11/13/18 1114  Temp 36.1 C 11/13/18 1112  Pulse 83 11/13/18 1115  Resp 22 11/13/18 1115  SpO2 100 % 11/13/18 1115  Vitals shown include unvalidated device data.  Last Pain:  Vitals:   11/13/18 1112  TempSrc: Tympanic  PainSc: 0-No pain         Complications: No apparent anesthesia complications

## 2018-11-13 NOTE — Anesthesia Preprocedure Evaluation (Signed)
Anesthesia Evaluation  Patient identified by MRN, date of birth, ID band Patient awake    Reviewed: Allergy & Precautions, NPO status , Patient's Chart, lab work & pertinent test results  History of Anesthesia Complications Negative for: history of anesthetic complications  Airway Mallampati: II       Dental   Pulmonary asthma , neg sleep apnea, neg COPD, Current Smoker (vapes),           Cardiovascular (-) hypertension(-) Past MI and (-) CHF (-) dysrhythmias (-) Valvular Problems/Murmurs     Neuro/Psych neg Seizures    GI/Hepatic Neg liver ROS, neg GERD  ,  Endo/Other  neg diabetes  Renal/GU negative Renal ROS     Musculoskeletal   Abdominal   Peds  Hematology   Anesthesia Other Findings   Reproductive/Obstetrics                             Anesthesia Physical Anesthesia Plan  ASA: II  Anesthesia Plan: General   Post-op Pain Management:    Induction: Intravenous  PONV Risk Score and Plan: 3 and Propofol infusion, TIVA and Ondansetron  Airway Management Planned: Nasal Cannula  Additional Equipment:   Intra-op Plan:   Post-operative Plan:   Informed Consent: I have reviewed the patients History and Physical, chart, labs and discussed the procedure including the risks, benefits and alternatives for the proposed anesthesia with the patient or authorized representative who has indicated his/her understanding and acceptance.       Plan Discussed with:   Anesthesia Plan Comments:         Anesthesia Quick Evaluation

## 2018-11-14 ENCOUNTER — Encounter: Payer: Self-pay | Admitting: Gastroenterology

## 2018-12-14 ENCOUNTER — Ambulatory Visit
Admission: RE | Admit: 2018-12-14 | Discharge: 2018-12-14 | Disposition: A | Discharge status: Home / Self Care | Payer: 59 | Source: Ambulatory Visit | Attending: Hematology and Oncology | Admitting: Hematology and Oncology

## 2018-12-14 DIAGNOSIS — Z171 Estrogen receptor negative status [ER-]: Secondary | ICD-10-CM

## 2018-12-14 DIAGNOSIS — C50911 Malignant neoplasm of unspecified site of right female breast: Secondary | ICD-10-CM

## 2018-12-14 DIAGNOSIS — Z853 Personal history of malignant neoplasm of breast: Secondary | ICD-10-CM | POA: Diagnosis not present

## 2018-12-14 DIAGNOSIS — Z1231 Encounter for screening mammogram for malignant neoplasm of breast: Secondary | ICD-10-CM | POA: Diagnosis not present

## 2018-12-27 ENCOUNTER — Other Ambulatory Visit: Payer: Self-pay

## 2018-12-27 ENCOUNTER — Ambulatory Visit
Admission: EM | Admit: 2018-12-27 | Discharge: 2018-12-27 | Disposition: A | Discharge status: Home / Self Care | Payer: 59 | Attending: Emergency Medicine | Admitting: Emergency Medicine

## 2018-12-27 ENCOUNTER — Encounter: Payer: Self-pay | Admitting: Emergency Medicine

## 2018-12-27 DIAGNOSIS — X500XXA Overexertion from strenuous movement or load, initial encounter: Secondary | ICD-10-CM

## 2018-12-27 DIAGNOSIS — N644 Mastodynia: Secondary | ICD-10-CM | POA: Diagnosis not present

## 2018-12-27 DIAGNOSIS — T148XXA Other injury of unspecified body region, initial encounter: Secondary | ICD-10-CM

## 2018-12-27 MED ORDER — CYCLOBENZAPRINE HCL 10 MG PO TABS: 10.0000 mg | ORAL_TABLET | Freq: Two times a day (BID) | ORAL | 0 refills | Status: DC | PRN

## 2018-12-27 MED ORDER — MELOXICAM 15 MG PO TABS: 15.0000 mg | ORAL_TABLET | Freq: Every day | ORAL | 0 refills | Status: DC | PRN

## 2018-12-27 NOTE — Discharge Instructions (Signed)
Take medication as prescribed. Rest. Drink plenty of fluids. Apply ice. No strenuous lifting. Monitor closely.   Follow up with your primary care physician or your oncologist this week if not resolved.   Return to Urgent care or ER for increased pain, swelling, rash or other concerns.

## 2018-12-27 NOTE — ED Provider Notes (Signed)
[facility] ____________________________________________  Time seen: Approximately 6:44 PM  I have reviewed the triage vital signs and the nursing notes.   HISTORY  Chief Complaint Breast Pain   HPI Jill Walsh is a 48 y.o. female presenting for evaluation of right breast pain.  Patient reports this started on Monday night.  Patient reports Monday throughout the day and into Monday night she was moving several storage totes and boxes, mostly of clothing.  States the boxes were not more than 20 pounds.  States towards the end of moving items she felt a pulling sensation to her right breast so she stopped moving items.  Reports later that night as she was taking a shower she lifted her arm up and felt increased pain.  Denies any pain at rest.  States pain is only with active range of motion or palpation.  States if she is sitting completely still she feels fine.  States pain particularly when lifting her right arm above head or lifting items or pushing up from the bed.  Denies any swelling, rash, paresthesias, weakness, drainage, direct fall, chest pain, shortness of breath or recent sickness.  Previous right total mastectomy in which she reports surgery was right at 4 years ago.  Reports no complications per surgery.  Denies any pain to the arm or swelling.  Juline Patch, MD : PCP OncMike Gip   Past Medical History:  Diagnosis Date  . Breast cancer ([place]) 2016   RT MASTECTOMY    Patient Active Problem List   Diagnosis Date Noted  . Simple chronic bronchitis ([place]) 05/30/2017  . Breast pain 12/20/2014  . Family history of breast cancer 05/28/2014  . Current smoker 05/28/2014  . Breast cancer ([place]) 05/28/2014  . Breast cancer, right breast ([place]) 03/28/2014    Past Surgical History:  Procedure Laterality Date  . BREAST BIOPSY Left    02/28/14 negative  . BREAST BIOPSY Right     02/2014 malignant  . COLONOSCOPY WITH PROPOFOL N/A 11/13/2018   Procedure:  COLONOSCOPY WITH PROPOFOL;  Surgeon: Jonathon Bellows, MD;  Location: [facility] ENDOSCOPY;  Service: Gastroenterology;  Laterality: N/A;  . MASTECTOMY Right 2016   BREAST CA  . TUBAL LIGATION       No current facility-administered medications for this encounter.   Current Outpatient Medications:  .  albuterol (PROVENTIL HFA;VENTOLIN HFA) 108 (90 Base) MCG/ACT inhaler, Inhale 2 puffs into the lungs every 6 (six) hours as needed for wheezing or shortness of breath., Disp: 1 Inhaler, Rfl: 0 .  ibuprofen (ADVIL) 200 MG tablet, Take 200 mg by mouth every 6 (six) hours as needed., Disp: , Rfl:  .  cyclobenzaprine (FLEXERIL) 10 MG tablet, Take 1 tablet (10 mg total) by mouth 2 (two) times daily as needed for muscle spasms. Do not drive while taking as can cause drowsiness, Disp: 15 tablet, Rfl: 0 .  meloxicam (MOBIC) 15 MG tablet, Take 1 tablet (15 mg total) by mouth daily as needed., Disp: 10 tablet, Rfl: 0  Allergies Tramadol  Family History  Problem Relation Age of Onset  . Cancer Mother   . Breast cancer Mother 26  . Stroke Brother   . Cancer Maternal Aunt     Social History Social History   Tobacco Use  . Smoking status: Former Smoker    Years: 15.00  . Smokeless tobacco: Former Network engineer Use Topics  . Alcohol use: No  . Drug use: No    Review of Systems Constitutional: No fever ENT:  No sore throat. Cardiovascular: Denies chest pain. Respiratory: Denies shortness of breath. Gastrointestinal: No abdominal pain.  Musculoskeletal: Negative for back pain. Positive right breast pain.  Skin: Negative for rash. Neurological: Negative for headaches, focal weakness or numbness.   ____________________________________________   PHYSICAL EXAM:  VITAL SIGNS: ED Triage Vitals  Enc Vitals Group     BP 12/27/18 1736 (!) 155/90     Pulse Rate 12/27/18 1736 93     Resp 12/27/18 1736 18     Temp 12/27/18 1736 (!) 97.5 F (36.4 C)     Temp Source 12/27/18 1736 Oral     SpO2  12/27/18 1736 99 %     Weight 12/27/18 1734 201 lb (91.2 kg)     Height 12/27/18 1734 5\' 5"  (1.651 m)     Head Circumference --      Peak Flow --      Pain Score 12/27/18 1734 8     Pain Loc --      Pain Edu? --      Excl. in [place]? --     Constitutional: Alert and oriented. Well appearing and in no acute distress. Eyes: Conjunctivae are normal.  ENT      Head: Normocephalic and atraumatic. Cardiovascular: Normal rate, regular rhythm. Grossly normal heart sounds.  Good peripheral circulation. Respiratory: Normal respiratory effort without tachypnea nor retractions. Breath sounds are clear and equal bilaterally. No wheezes, rales, rhonchi. Gastrointestinal: Soft and nontender.  Musculoskeletal:  No midline cervical, thoracic or lumbar tenderness to palpation.  No trapezius tenderness palpation. Patient declined chaperone. Right chest exam revealing mastectomy surgical scar, total mastectomy, patient has tenderness to right lateral pectoral muscle and mid to lower mastectomy scar mild, no warmth, no edema, no skin changes, right shoulder with full range of motion present with increased pain to right pectoral with resisted abduction, right upper extremity nontender and nonedematous.  Bilateral distal radial pulses equal. Neurologic:  Normal speech and language. Speech is normal. No gait instability.  Skin:  Skin is warm, dry and intact. No rash noted. Psychiatric: Mood and affect are normal. Speech and behavior are normal. Patient exhibits appropriate insight and judgment   ___________________________________________   LABS (all labs ordered are listed, but only abnormal results are displayed)  Labs Reviewed - No data to display   PROCEDURES Procedures   INITIAL IMPRESSION / ASSESSMENT AND PLAN / ED COURSE  Pertinent labs & imaging results that were available during my care of the patient were reviewed by me and considered in my medical decision making (see chart for details).   Well-appearing patient.  No acute distress.  Right breast pain post moving objects in which she reports is only present with active range of motion.  Suspect strain injury.  Counseled regarding avoidance of heavy lifting, ice, supportive care and rest.  Will treat patient with oral Mobic and parent Flexeril.  Work note given.  Strongly encourage follow-up with her primary care or her oncologist in 2 to 3 days if pain is continuing, sooner if worsening complaints, swelling or increased pain.Discussed indication, risks and benefits of medications with patient.   Discussed follow up and return parameters including no resolution or any worsening concerns. Patient verbalized understanding and agreed to plan.   ____________________________________________   FINAL CLINICAL IMPRESSION(S) / ED DIAGNOSES  Final diagnoses:  Breast pain, right  Muscle strain     ED Discharge Orders         Ordered    meloxicam (MOBIC) 15 MG  tablet  Daily PRN     12/27/18 1826    cyclobenzaprine (FLEXERIL) 10 MG tablet  2 times daily PRN     12/27/18 1826           Note: This dictation was prepared with Dragon dictation along with smaller phrase technology. Any transcriptional errors that result from this process are unintentional.         Marylene Land, NP 12/27/18 1850

## 2018-12-27 NOTE — ED Triage Notes (Signed)
Patient states she was moving furniture on Monday and she stated she is having right side breast pain. Patient states she has had breast cancer on the right side and had her breast removed and this is where there pain is.

## 2019-01-18 ENCOUNTER — Other Ambulatory Visit: Payer: Self-pay

## 2019-01-18 DIAGNOSIS — Z20822 Contact with and (suspected) exposure to covid-19: Secondary | ICD-10-CM

## 2019-01-18 NOTE — Addendum Note (Signed)
Addended by: Cyndi Lennert on: 01/18/2019 11:15 AM   Modules accepted: Orders

## 2019-01-19 LAB — NOVEL CORONAVIRUS, NAA: SARS-CoV-2, NAA: NOT DETECTED

## 2019-01-24 ENCOUNTER — Encounter: Payer: Self-pay | Admitting: Family Medicine

## 2019-01-24 ENCOUNTER — Ambulatory Visit (INDEPENDENT_AMBULATORY_CARE_PROVIDER_SITE_OTHER): Payer: Self-pay | Admitting: Family Medicine

## 2019-01-24 ENCOUNTER — Emergency Department: Payer: 59

## 2019-01-24 ENCOUNTER — Emergency Department
Admission: EM | Admit: 2019-01-24 | Discharge: 2019-01-24 | Disposition: A | Discharge status: Home / Self Care | Payer: 59 | Attending: Emergency Medicine | Admitting: Emergency Medicine

## 2019-01-24 ENCOUNTER — Encounter: Payer: Self-pay | Admitting: Intensive Care

## 2019-01-24 ENCOUNTER — Other Ambulatory Visit: Payer: Self-pay

## 2019-01-24 VITALS — Temp 98.0°F | Ht 65.0 in | Wt 201.0 lb

## 2019-01-24 DIAGNOSIS — Z20828 Contact with and (suspected) exposure to other viral communicable diseases: Secondary | ICD-10-CM | POA: Insufficient documentation

## 2019-01-24 DIAGNOSIS — Z20822 Contact with and (suspected) exposure to covid-19: Secondary | ICD-10-CM

## 2019-01-24 DIAGNOSIS — J069 Acute upper respiratory infection, unspecified: Secondary | ICD-10-CM | POA: Insufficient documentation

## 2019-01-24 DIAGNOSIS — R05 Cough: Secondary | ICD-10-CM | POA: Diagnosis present

## 2019-01-24 DIAGNOSIS — Z87891 Personal history of nicotine dependence: Secondary | ICD-10-CM | POA: Insufficient documentation

## 2019-01-24 DIAGNOSIS — R058 Other specified cough: Secondary | ICD-10-CM

## 2019-01-24 LAB — SARS CORONAVIRUS 2 (TAT 6-24 HRS): SARS Coronavirus 2: NEGATIVE

## 2019-01-24 MED ORDER — FLUTICASONE PROPIONATE 50 MCG/ACT NA SUSP: 1.0000 | Freq: Every day | NASAL | 2 refills | Status: DC

## 2019-01-24 MED ORDER — HYDROCOD POLST-CPM POLST ER 10-8 MG/5ML PO SUER: 5.0000 mL | Freq: Two times a day (BID) | ORAL | 0 refills | Status: AC

## 2019-01-24 NOTE — ED Triage Notes (Signed)
Pt c/o cough for over a week now that has worsened. Also c/o sore throat and hoarse voice

## 2019-01-24 NOTE — ED Provider Notes (Signed)
Emergency Department Provider Note  ____________________________________________  Time seen: Approximately 3:46 PM  I have reviewed the triage vital signs and the nursing notes.   HISTORY  Chief Complaint Cough   Historian Patient     HPI Jill Walsh is a 48 y.o. female with a history of dry, nonproductive cough for 1 week, pharyngitis and fatigue.  Patient states that her sister and friends at work have recently tested positive for Covid.  Her husband has recently developed new cough, diarrhea and fever.  She denies chest pain, chest tightness or abdominal pain.  Patient states that she has severe nasal congestion and does feel breathless at times, particularly with cough.  She had a televisit with her primary care provider who ordered outpatient testing and states that her initial test is negative.  She would like to be retested as she is concern for COVID-19 infection.  Patient has been taking Tylenol and ibuprofen for fever.  No other alleviating measures have been attempted.   Past Medical History:  Diagnosis Date  . Breast cancer ([place]) 2016   RT MASTECTOMY     Immunizations up to date:  Yes.     Past Medical History:  Diagnosis Date  . Breast cancer ([place]) 2016   RT MASTECTOMY    Patient Active Problem List   Diagnosis Date Noted  . Simple chronic bronchitis ([place]) 05/30/2017  . Breast pain 12/20/2014  . Family history of breast cancer 05/28/2014  . Current smoker 05/28/2014  . Breast cancer ([place]) 05/28/2014  . Breast cancer, right breast ([place]) 03/28/2014    Past Surgical History:  Procedure Laterality Date  . BREAST BIOPSY Left    02/28/14 negative  . BREAST BIOPSY Right     02/2014 malignant  . COLONOSCOPY WITH PROPOFOL N/A 11/13/2018   Procedure: COLONOSCOPY WITH PROPOFOL;  Surgeon: Jonathon Bellows, MD;  Location: [facility] ENDOSCOPY;  Service: Gastroenterology;  Laterality: N/A;  . MASTECTOMY Right 2016   BREAST CA  . TUBAL LIGATION      Prior to  Admission medications   Medication Sig Start Date End Date Taking? Authorizing Provider  albuterol (PROVENTIL HFA;VENTOLIN HFA) 108 (90 Base) MCG/ACT inhaler Inhale 2 puffs into the lungs every 6 (six) hours as needed for wheezing or shortness of breath. 03/23/18   Juline Patch, MD  chlorpheniramine-HYDROcodone (TUSSIONEX) 10-8 MG/5ML SUER Take 5 mLs by mouth 2 (two) times daily for 5 days. 01/24/19 01/29/19  Lannie Fields, PA-C  fluticasone (FLONASE) 50 MCG/ACT nasal spray Place 1 spray into both nostrils daily for 5 days. 01/24/19 01/29/19  Lannie Fields, PA-C  ibuprofen (ADVIL) 200 MG tablet Take 200 mg by mouth every 6 (six) hours as needed.    [provider]  meloxicam (MOBIC) 15 MG tablet Take 1 tablet (15 mg total) by mouth daily as needed. Patient not taking: Reported on 01/24/2019 12/27/18   Marylene Land, NP  ipratropium-albuterol (DUONEB) 0.5-2.5 (3) MG/3ML SOLN Take 3 mLs by nebulization every 6 (six) hours as needed. 03/23/18 12/27/18  Juline Patch, MD    Allergies Tramadol  Family History  Problem Relation Age of Onset  . Cancer Mother   . Breast cancer Mother 58  . Stroke Brother   . Cancer Maternal Aunt     Social History Social History   Tobacco Use  . Smoking status: Former Smoker    Years: 15.00  . Smokeless tobacco: Former Network engineer Use Topics  . Alcohol use: No  . Drug use: No  Review of Systems  Constitutional: Patient has fever.  Eyes: No visual changes. No discharge ENT: Patient has congestion.  Cardiovascular: no chest pain. Respiratory: Patient has cough.  Gastrointestinal: No abdominal pain.  No nausea, no vomiting. Patient had diarrhea.  Genitourinary: Negative for dysuria. No hematuria Musculoskeletal: Patient has myalgias.  Skin: Negative for rash, abrasions, lacerations, ecchymosis. Neurological: Patient has headache, no focal weakness or  numbness.     ____________________________________________   PHYSICAL EXAM:  VITAL SIGNS: ED Triage Vitals [01/24/19 1452]  Enc Vitals Group     BP 135/89     Pulse Rate (!) 109     Resp 16     Temp 98.9 F (37.2 C)     Temp Source Oral     SpO2 97 %     Weight 201 lb (91.2 kg)     Height 5\' 5"  (1.651 m)     Head Circumference      Peak Flow      Pain Score 7     Pain Loc      Pain Edu?      Excl. in [place]?      Constitutional: Alert and oriented. Patient is lying supine. Eyes: Conjunctivae are normal. PERRL. EOMI. Head: Atraumatic. ENT:      Ears: Tympanic membranes are mildly injected with mild effusion bilaterally.       Nose: No congestion/rhinnorhea.      Mouth/Throat: Mucous membranes are moist. Posterior pharynx is mildly erythematous.  Hematological/Lymphatic/Immunilogical: No cervical lymphadenopathy.  Cardiovascular: Normal rate, regular rhythm. Normal S1 and S2.  Good peripheral circulation. Respiratory: Normal respiratory effort without tachypnea or retractions. Lungs CTAB. Good air entry to the bases with no decreased or absent breath sounds. Gastrointestinal: Bowel sounds 4 quadrants. Soft and nontender to palpation. No guarding or rigidity. No palpable masses. No distention. No CVA tenderness. Musculoskeletal: Full range of motion to all extremities. No gross deformities appreciated. Neurologic:  Normal speech and language. No gross focal neurologic deficits are appreciated.  Skin:  Skin is warm, dry and intact. No rash noted. Psychiatric: Mood and affect are normal. Speech and behavior are normal. Patient exhibits appropriate insight and judgement.    ____________________________________________   LABS (all labs ordered are listed, but only abnormal results are displayed)  Labs Reviewed  SARS CORONAVIRUS 2 (TAT 6-24 HRS)   ____________________________________________  EKG   ____________________________________________  RADIOLOGY Unk Pinto, personally viewed and evaluated these images (plain radiographs) as part of my medical decision making, as well as reviewing the written report by the radiologist.  DG Chest 2 View  Result Date: 01/24/2019 CLINICAL DATA:  Cough. EXAM: CHEST - 2 VIEW COMPARISON:  August 01, 2018. FINDINGS: The heart size and mediastinal contours are within normal limits. Both lungs are clear. The visualized skeletal structures are unremarkable. IMPRESSION: No active cardiopulmonary disease. Electronically Signed   By: Marijo Conception M.D.   On: 01/24/2019 15:21    ____________________________________________    PROCEDURES  Procedure(s) performed:     Procedures     Medications - No data to display   ____________________________________________   INITIAL IMPRESSION / ASSESSMENT AND PLAN / ED COURSE  Pertinent labs & imaging results that were available during my care of the patient were reviewed by me and considered in my medical decision making (see chart for details).      Assessment and Plan: Unspecified Viral URI:  48 year old female presents to the emergency department with nonproductive cough for approximately 1  week and viral URI-like symptoms.  Patient was mildly tachycardic at triage but vital signs were otherwise reassuring.  She is satting at 97% on room air.  Patient was able to speak in complete sentences and had no adventitious lung sounds auscultated.  Differential diagnosis includes COVID-19, unspecified viral URI and community-acquired pneumonia.  No consolidations, opacities or infiltrates were identified on chest x-ray  Patient was discharged with Tussionex and Flonase.  Repeat COVID-19 testing is pending.  Patient was given a work note was cautioned to stay quarantined at home until COVID-19 results return.  Michelyn Swint was evaluated in Emergency Department on 01/24/2019 for the symptoms described in the history of present illness. She was evaluated in the  context of the global COVID-19 pandemic, which necessitated consideration that the patient might be at risk for infection with the SARS-CoV-2 virus that causes COVID-19. Institutional protocols and algorithms that pertain to the evaluation of patients at risk for COVID-19 are in a state of rapid change based on information released by regulatory bodies including the CDC and federal and state organizations. These policies and algorithms were followed during the patient's care in the ED.     ____________________________________________  FINAL CLINICAL IMPRESSION(S) / ED DIAGNOSES  Final diagnoses:  Viral URI with cough      NEW MEDICATIONS STARTED DURING THIS VISIT:  ED Discharge Orders         Ordered    chlorpheniramine-HYDROcodone (TUSSIONEX) 10-8 MG/5ML SUER  2 times daily     01/24/19 1540    fluticasone (FLONASE) 50 MCG/ACT nasal spray  Daily     01/24/19 1540              This chart was dictated using voice recognition software/Dragon. Despite best efforts to proofread, errors can occur which can change the meaning. Any change was purely unintentional.     Lannie Fields, PA-C 01/24/19 1551    Nena Polio, MD 01/27/19 2248

## 2019-01-24 NOTE — Discharge Instructions (Signed)
You can take Tussionex for cough.  Please use one spray of Flonase in each nostril for nasal congestion.

## 2019-01-24 NOTE — Progress Notes (Signed)
Date:  01/24/2019   Name:  Jill Walsh   DOB:  03-05-1970   MRN:  [id number]   Chief Complaint: Cough (productive cough with thick clear mucous x 6 days, sore throat)  I connected withthis patient, Jill Walsh, by telephoneat the patient's home.  I verified that I am speaking with the correct person using two identifiers. This visit was conducted via telephone due to the Covid-19 outbreak from my office at [facility] in [place], [place]. I discussed the limitations, risks, security and privacy concerns of performing an evaluation and management service by telephone. I also discussed with the patient that there may be a patient responsible charge related to this service. The patient expressed understanding and agreed to proceed.  Cough This is a new problem. The current episode started in the past 7 days (1 week). The problem occurs every few minutes. The cough is non-productive. Associated symptoms include headaches, myalgias, rhinorrhea, a sore throat and wheezing. Pertinent negatives include no chest pain, chills, ear congestion, ear pain, fever, heartburn, hemoptysis, nasal congestion, postnasal drip, rash, shortness of breath, sweats or weight loss. She has tried a beta-agonist inhaler and OTC cough suppressant for the symptoms. The treatment provided no relief. Her past medical history is significant for asthma. There is no history of environmental allergies.    Lab Results  Component Value Date   CREATININE 0.83 10/23/2018   BUN 14 10/23/2018   NA 138 10/23/2018   K 3.8 10/23/2018   CL 105 10/23/2018   CO2 24 10/23/2018   No results found for: CHOL, HDL, LDLCALC, LDLDIRECT, TRIG, CHOLHDL No results found for: TSH No results found for: HGBA1C   Review of Systems  Constitutional: Negative for chills, fever and weight loss.  HENT: Positive for rhinorrhea and sore throat. Negative for drooling, ear discharge, ear pain, hearing loss, mouth sores, postnasal drip, sinus  pressure, sinus pain and sneezing.   Respiratory: Positive for cough and wheezing. Negative for apnea, hemoptysis, choking, chest tightness and shortness of breath.   Cardiovascular: Negative for chest pain, palpitations and leg swelling.  Gastrointestinal: Negative for abdominal pain, blood in stool, constipation, diarrhea, heartburn and nausea.  Endocrine: Negative for polydipsia.  Genitourinary: Negative for dysuria, frequency, hematuria and urgency.  Musculoskeletal: Positive for myalgias. Negative for back pain and neck pain.  Skin: Negative for rash.  Allergic/Immunologic: Negative for environmental allergies.  Neurological: Positive for headaches. Negative for dizziness.  Hematological: Does not bruise/bleed easily.  Psychiatric/Behavioral: Negative for suicidal ideas. The patient is not nervous/anxious.     Patient Active Problem List   Diagnosis Date Noted  . Simple chronic bronchitis ([place]) 05/30/2017  . Breast pain 12/20/2014  . Family history of breast cancer 05/28/2014  . Current smoker 05/28/2014  . Breast cancer ([place]) 05/28/2014  . Breast cancer, right breast ([place]) 03/28/2014    Allergies  Allergen Reactions  . Tramadol Other (See Comments)    Real bad headache    Past Surgical History:  Procedure Laterality Date  . BREAST BIOPSY Left    02/28/14 negative  . BREAST BIOPSY Right     02/2014 malignant  . COLONOSCOPY WITH PROPOFOL N/A 11/13/2018   Procedure: COLONOSCOPY WITH PROPOFOL;  Surgeon: Jonathon Bellows, MD;  Location: [facility] ENDOSCOPY;  Service: Gastroenterology;  Laterality: N/A;  . MASTECTOMY Right 2016   BREAST CA  . TUBAL LIGATION      Social History   Tobacco Use  . Smoking status: Former Smoker    Years: 15.00  .  Smokeless tobacco: Former Network engineer Use Topics  . Alcohol use: No  . Drug use: No     Medication list has been reviewed and updated.  Current Meds  Medication Sig  . albuterol (PROVENTIL HFA;VENTOLIN HFA) 108 (90 Base) MCG/ACT  inhaler Inhale 2 puffs into the lungs every 6 (six) hours as needed for wheezing or shortness of breath.  . [DISCONTINUED] cyclobenzaprine (FLEXERIL) 10 MG tablet Take 1 tablet (10 mg total) by mouth 2 (two) times daily as needed for muscle spasms. Do not drive while taking as can cause drowsiness    PHQ 2/9 Scores 01/24/2019 08/18/2017 11/08/2016  PHQ - 2 Score 0 6 0  PHQ- 9 Score 0 18 0    BP Readings from Last 3 Encounters:  12/27/18 (!) 155/90  11/13/18 110/88  10/23/18 (!) 125/97    Physical Exam Nursing note reviewed.     Wt Readings from Last 3 Encounters:  01/24/19 201 lb (91.2 kg)  12/27/18 201 lb (91.2 kg)  11/13/18 197 lb (89.4 kg)    Temp 98 F (36.7 C) (Oral)   Ht 5\' 5"  (1.651 m)   Wt 201 lb (91.2 kg)   BMI 33.45 kg/m   Assessment and Plan: 1. Cough with exposure to COVID-19 virus Patient has a close contact with 2 individuals that tested positive for Covid patient developed a cough about 2 days later and then immediately got a Covid test which was determined to be negative but this was only 2 days after her exposure patient has continued for the week to have persistent cough with dyspnea and wheezing but no fever.  Patient has been instructed to go to a medical care facility where she can be rechecked for hypoxia and pneumonia as well as possibly having her Covid reevaluated.  I spent 10 minutes with this patient, no time was spent in face to face education, counseling and care coordination.

## 2019-04-02 ENCOUNTER — Ambulatory Visit: Payer: Self-pay | Admitting: Family Medicine

## 2019-04-19 ENCOUNTER — Inpatient Hospital Stay: Payer: 59 | Attending: Hematology and Oncology

## 2019-04-19 ENCOUNTER — Other Ambulatory Visit: Payer: Self-pay

## 2019-04-19 DIAGNOSIS — C50911 Malignant neoplasm of unspecified site of right female breast: Secondary | ICD-10-CM

## 2019-04-19 DIAGNOSIS — Z171 Estrogen receptor negative status [ER-]: Secondary | ICD-10-CM | POA: Insufficient documentation

## 2019-04-19 DIAGNOSIS — Z0000 Encounter for general adult medical examination without abnormal findings: Secondary | ICD-10-CM

## 2019-04-19 LAB — CBC WITH DIFFERENTIAL/PLATELET
Abs Immature Granulocytes: 0.03 10*3/uL (ref 0.00–0.07)
Basophils Absolute: 0 10*3/uL (ref 0.0–0.1)
Basophils Relative: 1 %
Eosinophils Absolute: 0.2 10*3/uL (ref 0.0–0.5)
Eosinophils Relative: 2 %
HCT: 38.3 % (ref 36.0–46.0)
Hemoglobin: 12.5 g/dL (ref 12.0–15.0)
Immature Granulocytes: 0 %
Lymphocytes Relative: 30 %
Lymphs Abs: 2.6 10*3/uL (ref 0.7–4.0)
MCH: 27.2 pg (ref 26.0–34.0)
MCHC: 32.6 g/dL (ref 30.0–36.0)
MCV: 83.3 fL (ref 80.0–100.0)
Monocytes Absolute: 0.5 10*3/uL (ref 0.1–1.0)
Monocytes Relative: 6 %
Neutro Abs: 5.4 10*3/uL (ref 1.7–7.7)
Neutrophils Relative %: 61 %
Platelets: 295 10*3/uL (ref 150–400)
RBC: 4.6 MIL/uL (ref 3.87–5.11)
RDW: 14.6 % (ref 11.5–15.5)
WBC: 8.8 10*3/uL (ref 4.0–10.5)
nRBC: 0 % (ref 0.0–0.2)

## 2019-04-19 LAB — COMPREHENSIVE METABOLIC PANEL
ALT: 23 U/L (ref 0–44)
AST: 22 U/L (ref 15–41)
Albumin: 4.1 g/dL (ref 3.5–5.0)
Alkaline Phosphatase: 81 U/L (ref 38–126)
Anion gap: 13 (ref 5–15)
BUN: 14 mg/dL (ref 6–20)
CO2: 22 mmol/L (ref 22–32)
Calcium: 9.3 mg/dL (ref 8.9–10.3)
Chloride: 100 mmol/L (ref 98–111)
Creatinine, Ser: 0.85 mg/dL (ref 0.44–1.00)
GFR calc Af Amer: >60 mL/min (ref >60)
GFR calc non Af Amer: >60 mL/min (ref >60)
Glucose, Bld: 105 mg/dL — ABNORMAL HIGH (ref 70–99)
Potassium: 3.8 mmol/L (ref 3.5–5.1)
Sodium: 135 mmol/L (ref 135–145)
Total Bilirubin: 0.6 mg/dL (ref 0.3–1.2)
Total Protein: 7.8 g/dL (ref 6.5–8.1)

## 2019-04-20 ENCOUNTER — Encounter: Payer: Self-pay | Admitting: Hematology and Oncology

## 2019-04-20 LAB — CANCER ANTIGEN 27.29: CA 27.29: 19.5 U/mL (ref 0.0–38.6)

## 2019-04-20 NOTE — Progress Notes (Signed)
No new changes noted today. The patient Name and DOB has been verified by phone today. 

## 2019-04-21 NOTE — [place] Notes (Signed)
[facility]  [address], [address] [place], [place] [address] Phone: [phone]  Fax: [phone]   Telemedicine Office Visit:  04/23/2019  Referring physician: Juline Patch, MD  I connected with Izetta Dakin on 04/23/2019 at 9:57 AM by videoconferencing and verified that I was speaking with the correct person using 2 identifiers.  The patient was at home.  I discussed the limitations, risk, security and privacy concerns of performing an evaluation and management service by videoconferencing and the availability of in person appointments.  I also discussed with the patient that there may be a patient responsible charge related to this service.  The patient expressed understanding and agreed to proceed.   Chief Complaint: Jill Walsh is a 49 y.o. 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   HPI: The patient was last seen in the medical oncology clinic on 10/23/2018. At that time, she was doing well. She had left axillary hidradenitis suppurativa.  CBC and CMP were normal. CA 27.29 was 13.1.  Surveillance continued.  I referred her to GI for screening colonoscopy; she has 2 aunts with colon cancer.  Colonoscopy on 11/13/2018 with Dr. Vicente Males revealed a normal colon direct and retroflexion views.  Left screening mammogram on 12/14/2018 revealed no evidence of malignancy.   Labs on 04/19/2019 included hematocrit 38.3, hemoglobin 12.5, platelets 295,000, WBC 8,800.  CBC and CMP were normal.  CA 27.29 was 19.5.   During the interim, she has felt good. She notes small knots under her armpit that are occasionally sore. She is trying to get use to her new bra pump. She is taking tylenol PM to sleep better at night and to improve pain. She performs monthly breast exams.   She reports being on a diet since 03/2019. She has lost 6 pounds. She would like to lose 55 more pounds. She is currently  210 pounds. She is more active and walks daily. She is working on building up her strength. She continues to have to lift heavy objects at work but she is trying to take it slow. She reports feeling tired at times after work.    Past Medical History:  Diagnosis Date  . Breast cancer ([place]) 2016   RT MASTECTOMY    Past Surgical History:  Procedure Laterality Date  . BREAST BIOPSY Left    02/28/14 negative  . BREAST BIOPSY Right     02/2014 malignant  . COLONOSCOPY WITH PROPOFOL N/A 11/13/2018   Procedure: COLONOSCOPY WITH PROPOFOL;  Surgeon: Jonathon Bellows, MD;  [facility] END[facility]COPY;  Service: Gastroenterology;  Laterality: N/A;  . MASTECTOMY Right 2016   BREAST CA  . TUBAL LIGATION      Family History  Problem Relation Age of Onset  . Cancer Mother   . Breast cancer Mother 45  . Stroke Brother   . Cancer Maternal Aunt     Social History:  reports that she has quit smoking. She quit after 15.00 years of use. She has quit using smokeless tobacco. She reports that she does not drink alcohol or use drugs. 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 She recently sold a home and just moved into her new home. The patient is alone today.  Participants in the patient's visit and their role in the encounter included the patient and Vito Berger, CMA, today.  The intake visit was provided by  Vito Berger, CMA.  Allergies:  Allergies  Allergen Reactions  . Tramadol Other (See Comments)    Real bad headache    Current Medications: Current Outpatient Medications  Medication Sig Dispense Refill  . diphenhydrAMINE-APAP, sleep, (TYLENOL PM EXTRA STRENGTH PO) Take 500 mg by mouth in the morning and at bedtime.    Marland Kitchen albuterol (PROVENTIL HFA;VENTOLIN HFA) 108 (90 Base) MCG/ACT inhaler Inhale 2 puffs into the lungs every 6 (six) hours as needed for wheezing or shortness of breath. (Patient not taking: Reported on 04/20/2019)  1 Inhaler 0  . fluticasone (FLONASE) 50 MCG/ACT nasal spray Place 1 spray into both nostrils daily for 5 days. (Patient not taking: Reported on 04/20/2019) 16 g 2  . ibuprofen (ADVIL) 200 MG tablet Take 200 mg by mouth every 6 (six) hours as needed.    . meloxicam (MOBIC) 15 MG tablet Take 1 tablet (15 mg total) by mouth daily as needed. (Patient not taking: Reported on 01/24/2019) 10 tablet 0   No current facility-administered medications for this visit.     Review of Systems  Constitutional: Positive for malaise/fatigue (after work) and weight loss (6 lbs; weighs 210 lbs; intentional). Negative for chills, diaphoresis and fever.       Feels good.  HENT: Negative for congestion, ear discharge, ear pain, hearing loss, nosebleeds, sinus pain and sore throat.   Eyes: Negative.  Negative for blurred vision, double vision, photophobia and pain.  Respiratory: Negative.  Negative for cough, hemoptysis, sputum production and shortness of breath.   Cardiovascular: Negative.  Negative for chest pain, palpitations, orthopnea and PND.  Gastrointestinal: Negative.  Negative for abdominal pain, blood in stool, constipation, diarrhea, heartburn, melena, nausea and vomiting.  Genitourinary: Negative.  Negative for dysuria, frequency, hematuria and urgency.  Musculoskeletal: Negative.  Negative for back pain, joint pain and myalgias.       Knots under armpit; sore. Wearing a bra pump.  Skin: Negative for rash.       Axillary rash- hidradenitis suppurtiva.  Neurological: Negative.  Negative for dizziness, sensory change, speech change, focal weakness, weakness and headaches.  Endo/Heme/Allergies: Negative.  Does not bruise/bleed easily.  Psychiatric/Behavioral: Negative for depression and memory loss. The patient has insomnia. The patient is not nervous/anxious.   All other systems reviewed and are negative.   Performance status (ECOG): 1  Physical Exam  Constitutional: She is oriented to person, place,  and time. She appears well-developed and well-nourished. No distress.  HENT:  Head: Normocephalic and atraumatic.  Long curly brown hair.  Eyes: Conjunctivae and EOM are normal. No scleral icterus.  Brown eyes.   Neurological: She is alert and oriented to person, place, and time.  Skin: Skin is intact. No bruising and no lesion noted. She is not diaphoretic.  Psychiatric: She has a normal mood and affect. Her behavior is normal. Judgment and thought content normal.  Nursing note reviewed.   No visits with results within 3 Day(s) from this visit.  Latest known visit with results is:  Appointment on 04/19/2019  Component Date Value Ref Range Status  . Sodium 04/19/2019 135  135 - 145 mmol/L Final  . Potassium 04/19/2019 3.8  3.5 - 5.1 mmol/L Final  . Chloride 04/19/2019 100  98 - 111 mmol/L Final  . CO2 04/19/2019 22  22 - 32 mmol/L Final  . Glucose, Bld 04/19/2019 105* 70 - 99 mg/dL Final   Glucose reference range applies only to samples taken after fasting for at least 8 hours.  Marland Kitchen  BUN 04/19/2019 14  6 - 20 mg/dL Final  . Creatinine, Ser 04/19/2019 0.85  0.44 - 1.00 mg/dL Final  . Calcium 04/19/2019 9.3  8.9 - 10.3 mg/dL Final  . Total Protein 04/19/2019 7.8  6.5 - 8.1 g/dL Final  . Albumin 04/19/2019 4.1  3.5 - 5.0 g/dL Final  . AST 04/19/2019 22  15 - 41 U/L Final  . ALT 04/19/2019 23  0 - 44 U/L Final  . Alkaline Phosphatase 04/19/2019 81  38 - 126 U/L Final  . Total Bilirubin 04/19/2019 0.6  0.3 - 1.2 mg/dL Final  . GFR calc non Af Amer 04/19/2019 >60  >60 mL/min Final  . GFR calc Af Amer 04/19/2019 >60  >60 mL/min Final  . Anion gap 04/19/2019 13  5 - 15 Final   Performed at [facility], [address] [place], [place] [address]  . CA 27.29 04/19/2019 19.5  0.0 - 38.6 U/mL Final   Comment: (NOTE) Siemens Centaur Immunochemiluminometric Methodology [facility]) Values obtained with different assay methods or kits cannot be used interchangeably. Results cannot be  interpreted as absolute evidence of the presence or absence of malignant disease. Performed At: [facility] [place], [place] [id number] Rush Farmer MD [id number]   . WBC 04/19/2019 8.8  4.0 - 10.5 K/uL Final  . RBC 04/19/2019 4.60  3.87 - 5.11 MIL/uL Final  . Hemoglobin 04/19/2019 12.5  12.0 - 15.0 g/dL Final  . HCT 04/19/2019 38.3  36.0 - 46.0 % Final  . MCV 04/19/2019 83.3  80.0 - 100.0 fL Final  . MCH 04/19/2019 27.2  26.0 - 34.0 pg Final  . MCHC 04/19/2019 32.6  30.0 - 36.0 g/dL Final  . RDW 04/19/2019 14.6  11.5 - 15.5 % Final  . Platelets 04/19/2019 295  150 - 400 K/uL Final  . nRBC 04/19/2019 0.0  0.0 - 0.2 % Final  . Neutrophils Relative % 04/19/2019 61  % Final  . Neutro Abs 04/19/2019 5.4  1.7 - 7.7 K/uL Final  . Lymphocytes Relative 04/19/2019 30  % Final  . Lymphs Abs 04/19/2019 2.6  0.7 - 4.0 K/uL Final  . Monocytes Relative 04/19/2019 6  % Final  . Monocytes Absolute 04/19/2019 0.5  0.1 - 1.0 K/uL Final  . Eosinophils Relative 04/19/2019 2  % Final  . Eosinophils Absolute 04/19/2019 0.2  0.0 - 0.5 K/uL Final  . Basophils Relative 04/19/2019 1  % Final  . Basophils Absolute 04/19/2019 0.0  0.0 - 0.1 K/uL Final  . Immature Granulocytes 04/19/2019 0  % Final  . Abs Immature Granulocytes 04/19/2019 0.03  0.00 - 0.07 K/uL Final   Performed at [facility], [address] [place], [place] [address]    Assessment:  Darling Cieslewicz is a 49 y.o. female with Her2neu+ multi-focal microinvasive right breast cancerstatus [facility]. No lymph nodes were removed. Tumor was ER/PR negative and Her2/neu 3+. Pathologic stage was T50mNx.   Left mammogramand ultrasound on 12/06/2014 revealed no mammographic or sonographic evidence of malignancy in the left breast. There was an incidental note of two less than 4 mm benign appearing cysts at the 9:30 o'clock 9 cm and 10 cm from the nipple. Left screening mammogramon  12/09/2016 revealed no mammographic evidence of malignancy. Left screening mammogramon 12/12/2017 revealed no evidence of malignancy.  Left screening mammogram on 12/14/2018 revealed no evidence of malignancy.   CA27.29has been followed: 24.4 on 04/30/2014, 18.0 on 11/25/2014, 26.8 on 03/14/2015, 20.6 on  07/14/2015, 19.1 on 12/11/2015, 21.6 on 04/15/2016, 18.6 on 10/18/2016, 17.6 on 03/29/2017, 16.8 on 09/26/2017, 13.2 on 04/05/2018, 13.1 on 10/23/2018, and 19.5 on 04/19/2019.  MyRisk genetic testingrevealed no clinically significant mutation. She has a mother who developed breast cancer at age 26. A maternal aunt has colon cancer. Colonoscopy on 11/13/2018 was normal.  Symptomatically, she is doing well.  She denies any breast concerns.  She has recurrent axillary hidradenitis suppurativa.  Plan: 1.   Review labs from 04/19/2019. 2. Her2neu+ multi-focal microinvasive right breast cancer: Clinically, she continues to do well.             Exam on 10/23/2018 revealed no evidence of recurrent disease.             Left mammogram on 12/14/2018 revealed no evidence of malignancy. Continue surveillance. 3.Family history of colon cancer             Patient has a significant family history.             Patient's brother recently died at age 105.             She has 2 aunts with colon cancer.             Screening colonoscopy on 11/13/2018 was normal. 4.   Hidradenitis suppurativa             Patient followed by Dr. Ronnald Ramp. 5.   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 6.   RTC in 6 months for MD assessment and labs (CBC with diff, CMP, CA27.29).  I discussed the assessment and treatment plan with the patient.  The patient was provided an opportunity to ask questions and all were answered.  The patient agreed with the plan and demonstrated an understanding of the instructions.  The patient was advised to call back or seek an in person  evaluation if the symptoms worsen or if the condition fails to improve as anticipated.  I provided 10 minutes (9:57 AM - 10:06 AM) of face-to-face video visit time during this this encounter and > 50% was spent counseling as documented under my assessment and plan.  I provided these services from the [facility] office.   Nolon Stalls, MD, PhD  04/23/2019, 9:57 AM  I, Katoria Batten, am acting as scribe for [employer]. Mike Gip, MD, PhD.  I, Ninamarie Keel C. Mike Gip, MD, have reviewed the above documentation for accuracy and completeness, and I agree with the above.

## 2019-04-23 ENCOUNTER — Inpatient Hospital Stay: Payer: 59

## 2019-04-23 ENCOUNTER — Encounter: Payer: Self-pay | Admitting: Hematology and Oncology

## 2019-04-23 ENCOUNTER — Inpatient Hospital Stay (HOSPITAL_BASED_OUTPATIENT_CLINIC_OR_DEPARTMENT_OTHER): Payer: 59 | Admitting: Hematology and Oncology

## 2019-04-23 DIAGNOSIS — Z800 Family history of malignant neoplasm of digestive organs: Secondary | ICD-10-CM

## 2019-04-23 DIAGNOSIS — Z171 Estrogen receptor negative status [ER-]: Secondary | ICD-10-CM

## 2019-04-23 DIAGNOSIS — C50911 Malignant neoplasm of unspecified site of right female breast: Secondary | ICD-10-CM | POA: Diagnosis not present

## 2019-05-24 ENCOUNTER — Other Ambulatory Visit: Payer: Self-pay

## 2019-05-24 ENCOUNTER — Ambulatory Visit
Admission: EM | Admit: 2019-05-24 | Discharge: 2019-05-24 | Disposition: A | Discharge status: Home / Self Care | Payer: 59 | Attending: Emergency Medicine | Admitting: Emergency Medicine

## 2019-05-24 ENCOUNTER — Encounter: Payer: Self-pay | Admitting: Emergency Medicine

## 2019-05-24 DIAGNOSIS — L245 Irritant contact dermatitis due to other chemical products: Secondary | ICD-10-CM | POA: Diagnosis not present

## 2019-05-24 MED ORDER — BETAMETHASONE VALERATE 0.12 % EX FOAM: 1.0000 "application " | Freq: Two times a day (BID) | CUTANEOUS | 0 refills | Status: DC

## 2019-05-24 MED ORDER — PREDNISONE 10 MG (21) PO TBPK: ORAL_TABLET | ORAL | 0 refills | Status: DC

## 2019-05-24 MED ORDER — HYDROXYZINE HCL 25 MG PO TABS: 25.0000 mg | ORAL_TABLET | Freq: Four times a day (QID) | ORAL | 0 refills | Status: DC | PRN

## 2019-05-24 NOTE — ED Provider Notes (Signed)
HPI  SUBJECTIVE:  Jill Walsh is a 49 y.o. female who presents with an intensely itchy burning painful diffuse scalp rash several days after getting her hair dyed.  States that her "whole head" itches and burns.  She states that she was fine for 3 days prior to the rash appearing.  She denies other new lotions soaps detergents foods.  She denies rash elsewhere.  No body aches fevers flulike symptoms.  She does not take any medications on a regular basis.  She has had symptoms like this before which were found to be contact dermatitis.  She has been taking loratadine 10 mg twice daily, Benadryl 50 mg nightly and shampooing/conditioning her hair twice a day.  She has also been applying cold water to her scalp.  Shampooing/conditioner seems to help.  No aggravating factors.  She was having the symptoms prior to the intense shampooing.  This is the same shampoo and conditioner that she has been using for some time.  Past medical history of asthma, breast cancer status post right mastectomy.  No history of diabetes eczema hypertension.  [id number], Iven Finn, MD     Past Medical History:  Diagnosis Date  . Breast cancer ([place]) 2016   RT MASTECTOMY    Past Surgical History:  Procedure Laterality Date  . BREAST BIOPSY Left    02/28/14 negative  . BREAST BIOPSY Right     02/2014 malignant  . COLONOSCOPY WITH PROPOFOL N/A 11/13/2018   Procedure: COLONOSCOPY WITH PROPOFOL;  Surgeon: Jonathon Bellows, MD;  Location: [facility] ENDOSCOPY;  Service: Gastroenterology;  Laterality: N/A;  . MASTECTOMY Right 2016   BREAST CA  . TUBAL LIGATION      Family History  Problem Relation Age of Onset  . Cancer Mother   . Breast cancer Mother 15  . Stroke Brother   . Cancer Maternal Aunt     Social History   Tobacco Use  . Smoking status: Former Smoker    Years: 15.00  . Smokeless tobacco: Former Network engineer Use Topics  . Alcohol use: No  . Drug use: No    No current facility-administered medications for  this encounter.  Current Outpatient Medications:  .  Betamethasone Valerate 0.12 % foam, Apply 1 application topically 2 (two) times daily. Do not use for more than 2 weeks, Disp: 100 g, Rfl: 0 .  fluticasone (FLONASE) 50 MCG/ACT nasal spray, Place 1 spray into both nostrils daily for 5 days. (Patient not taking: Reported on 04/20/2019), Disp: 16 g, Rfl: 2 .  hydrOXYzine (ATARAX/VISTARIL) 25 MG tablet, Take 1 tablet (25 mg total) by mouth every 6 (six) hours as needed for itching., Disp: 20 tablet, Rfl: 0 .  predniSONE (STERAPRED UNI-PAK 21 TAB) 10 MG (21) TBPK tablet, Dispense one 6 day pack. Take as directed with food., Disp: 21 tablet, Rfl: 0  Allergies  Allergen Reactions  . Tramadol Other (See Comments)    Real bad headache     ROS  As noted in HPI.   Physical Exam  BP (!) 135/92 (BP Location: Right Arm)   Pulse 96   Temp 97.6 F (36.4 C) (Oral)   Resp 18   Ht 5\' 5"  (1.651 m)   Wt 88 kg   LMP 05/16/2019   SpO2 100%   BMI 32.28 kg/m   Constitutional: Well developed, well nourished, no acute distress Eyes:  EOMI, conjunctiva normal bilaterally HENT: Normocephalic, atraumatic,mucus membranes moist Respiratory: Normal inspiratory effort Cardiovascular: Normal rate GI: nondistended skin: Diffuse tender  erythematous papular plaque like, vesicular rash over the entire scalp and both sides of the neck.  No crusting.  there is no facial rash.         Lymph: No cervical lymphadenopathy  Musculoskeletal: no deformities Neurologic: Alert & oriented x 3, no focal neuro deficits Psychiatric: Speech and behavior appropriate   ED Course   Medications - No data to display  No orders of the defined types were placed in this encounter.   No results found for this or any previous visit (from the past 24 hour(s)). No results found.  ED Clinical Impression  1. Irritant contact dermatitis due to other chemical products      ED Assessment/Plan  Presentation  consistent with a contact dermatitis.  Home with Luxiq shampoo.  If this is too expensive, then she can take 6 days of prednisone.  She is to continue the loratadine 10 mg once a day and take Benadryl 50 mg at night if these medications do not work, Atarax.  No evidence of infection at this time withholding antibiotics.  do not wash hair for several days.  Doubt shingles.  Follow-up with PMD.  To the ER if this disseminates.  2-day work note.  Discussed with pharmacy.  Luxiq is expensive, however the clobetasol solution 0.05% solution is more affordable.  We will have them fill this.  Apply twice daily until symptoms resolve,  maximum 2 weeks.  Discussed, MDM, treatment plan, and plan for follow-up with patient. Discussed sn/sx that should prompt return to the ED. patient agrees with plan.   Meds ordered this encounter  Medications  . hydrOXYzine (ATARAX/VISTARIL) 25 MG tablet    Sig: Take 1 tablet (25 mg total) by mouth every 6 (six) hours as needed for itching.    Dispense:  20 tablet    Refill:  0  . predniSONE (STERAPRED UNI-PAK 21 TAB) 10 MG (21) TBPK tablet    Sig: Dispense one 6 day pack. Take as directed with food.    Dispense:  21 tablet    Refill:  0  . DISCONTD: Betamethasone Valerate 0.12 % foam    Sig: Apply 1 application topically 2 (two) times daily. Until symptoms resolve    Dispense:  100 g    Refill:  0  . Betamethasone Valerate 0.12 % foam    Sig: Apply 1 application topically 2 (two) times daily. Do not use for more than 2 weeks    Dispense:  100 g    Refill:  0    *This clinic note was created using [employer]. Therefore, there may be occasional mistakes despite careful proofreading.   ?    Melynda Ripple, MD 05/24/19 [phone]

## 2019-05-24 NOTE — Discharge Instructions (Addendum)
I suspect that this came from the hair dye.  Try the Luxiq first before starting the prednisone. if it is too expensive then prednisone should work as well.  Take 10 mg of loratadine during the day and 50 mg of Benadryl at night, and if this does not work, then take the Atarax.  Do not take loratadine and Benadryl if you are taking the Atarax.

## 2019-05-24 NOTE — ED Triage Notes (Signed)
Patient c/o itchy rash on face and neck that started Tuesday. She states she got her hair dyed on Saturday but is unsure if that has anything to do with it.

## 2019-05-25 ENCOUNTER — Ambulatory Visit: Payer: 59 | Attending: Oncology

## 2019-05-25 DIAGNOSIS — Z23 Encounter for immunization: Secondary | ICD-10-CM

## 2019-05-25 NOTE — [place] Notes (Signed)
   Covid-19 Vaccination Clinic  Name:  Terilynn Ruvalcaba    MRN: [id number] DOB: Aug 15, 1970  05/25/2019  Ms. Sessums was observed post Covid-19 immunization for 15 minutes without incident. She was provided with Vaccine Information Sheet and instruction to access the V-Safe system.   Ms. Elsayed was instructed to call 911 with any severe reactions post vaccine: Marland Kitchen Difficulty breathing  . Swelling of face and throat  . A fast heartbeat  . A bad rash all over body  . Dizziness and weakness   Immunizations Administered    Name Date Dose VIS Date Route   Pfizer COVID-19 Vaccine 05/25/2019 10:31 AM 0.3 mL 01/19/2019 Intramuscular   Manufacturer: [place]   Lot: [id number]   [place]: [id number]

## 2019-06-19 ENCOUNTER — Ambulatory Visit: Payer: 59 | Attending: Internal Medicine

## 2019-06-19 DIAGNOSIS — Z23 Encounter for immunization: Secondary | ICD-10-CM

## 2019-06-19 NOTE — [place] Notes (Signed)
   Covid-19 Vaccination Clinic  Name:  Jill Walsh    MRN: [id number] DOB: 08-18-1970  06/19/2019  [name] was observed post Covid-19 immunization for 15 minutes without incident. She was provided with Vaccine Information Sheet and instruction to access the V-Safe system.   [name] was instructed to call 911 with any severe reactions post vaccine: Marland Kitchen Difficulty breathing  . Swelling of face and throat  . A fast heartbeat  . A bad rash all over body  . Dizziness and weakness   Immunizations Administered    Name Date Dose VIS Date Route   Pfizer COVID-19 Vaccine 06/19/2019  4:16 PM 0.3 mL 04/04/2018 Intramuscular   Manufacturer: [place]   Lot: [id number]   [place]: [id number]

## 2019-08-21 ENCOUNTER — Emergency Department: Payer: 59

## 2019-08-21 ENCOUNTER — Other Ambulatory Visit: Payer: Self-pay

## 2019-08-21 ENCOUNTER — Emergency Department
Admission: EM | Admit: 2019-08-21 | Discharge: 2019-08-21 | Disposition: A | Discharge status: Home / Self Care | Payer: 59 | Attending: Emergency Medicine | Admitting: Emergency Medicine

## 2019-08-21 ENCOUNTER — Encounter: Payer: Self-pay | Admitting: Emergency Medicine

## 2019-08-21 DIAGNOSIS — R0789 Other chest pain: Secondary | ICD-10-CM | POA: Diagnosis not present

## 2019-08-21 DIAGNOSIS — C50911 Malignant neoplasm of unspecified site of right female breast: Secondary | ICD-10-CM | POA: Insufficient documentation

## 2019-08-21 DIAGNOSIS — Z87891 Personal history of nicotine dependence: Secondary | ICD-10-CM | POA: Insufficient documentation

## 2019-08-21 LAB — BASIC METABOLIC PANEL
Anion gap: 12 (ref 5–15)
BUN: 13 mg/dL (ref 6–20)
CO2: 22 mmol/L (ref 22–32)
Calcium: 9.2 mg/dL (ref 8.9–10.3)
Chloride: 103 mmol/L (ref 98–111)
Creatinine, Ser: 0.93 mg/dL (ref 0.44–1.00)
GFR calc Af Amer: >60 mL/min (ref >60)
GFR calc non Af Amer: >60 mL/min (ref >60)
Glucose, Bld: 93 mg/dL (ref 70–99)
Potassium: 3.9 mmol/L (ref 3.5–5.1)
Sodium: 137 mmol/L (ref 135–145)

## 2019-08-21 LAB — CBC
HCT: 37.2 % (ref 36.0–46.0)
Hemoglobin: 12.5 g/dL (ref 12.0–15.0)
MCH: 27.7 pg (ref 26.0–34.0)
MCHC: 33.6 g/dL (ref 30.0–36.0)
MCV: 82.3 fL (ref 80.0–100.0)
Platelets: 283 10*3/uL (ref 150–400)
RBC: 4.52 MIL/uL (ref 3.87–5.11)
RDW: 14.6 % (ref 11.5–15.5)
WBC: 14.8 10*3/uL — ABNORMAL HIGH (ref 4.0–10.5)
nRBC: 0 % (ref 0.0–0.2)

## 2019-08-21 LAB — TROPONIN I (HIGH SENSITIVITY)
Troponin I (High Sensitivity): 3 ng/L (ref <18)
Troponin I (High Sensitivity): 11 ng/L (ref <18)
Troponin I (High Sensitivity): 6 ng/L (ref <18)

## 2019-08-21 MED ORDER — KETOROLAC TROMETHAMINE 30 MG/ML IJ SOLN: 30.0000 mg | Freq: Once | INTRAMUSCULAR | Status: DC

## 2019-08-21 MED ORDER — SODIUM CHLORIDE 0.9% FLUSH: 3.0000 mL | Freq: Once | INTRAVENOUS | Status: DC

## 2019-08-21 MED ORDER — KETOROLAC TROMETHAMINE 30 MG/ML IJ SOLN
30.0000 mg | Freq: Once | INTRAMUSCULAR | Status: AC
  Administered 2019-08-21: 30 mg via INTRAVENOUS
  Filled 2019-08-21: qty 1

## 2019-08-21 MED ORDER — NAPROXEN 500 MG PO TABS: 500.0000 mg | ORAL_TABLET | Freq: Two times a day (BID) | ORAL | 2 refills | Status: DC

## 2019-08-21 MED ORDER — IOHEXOL 350 MG/ML SOLN
75.0000 mL | Freq: Once | INTRAVENOUS | Status: AC | PRN
  Administered 2019-08-21: 75 mL via INTRAVENOUS
  Filled 2019-08-21: qty 75

## 2019-08-21 NOTE — ED Provider Notes (Signed)
[facility] Emergency Department Provider Note   ____________________________________________    I have reviewed the triage vital signs and the nursing notes.   HISTORY  Chief Complaint Chest Pain (R side, )     HPI Jill Walsh is a 49 y.o. female who presents with complaints of right-sided chest pain.  Patient reports yesterday she was at work and had lifted a heavy object and was setting it down when she developed a sharp pain in her right chest wall beneath her breast.  She reports it was worse with movement of her right arm and palpating the area.  Over the course of the day it continued to hurt, occasionally she had tightness around the area as well.  One time she had pain when taking a deep breath at the same area.  No calf pain or swelling.  No history of DVT.  No fevers chills or cough.  No history of CAD.  Has not taken anything for this  Past Medical History:  Diagnosis Date  . Breast cancer ([place]) 2016   RT MASTECTOMY    Patient Active Problem List   Diagnosis Date Noted  . Family history of colon cancer 04/23/2019  . Simple chronic bronchitis ([place]) 05/30/2017  . Breast pain 12/20/2014  . Family history of breast cancer 05/28/2014  . Current smoker 05/28/2014  . Breast cancer ([place]) 05/28/2014  . Breast cancer, right breast ([place]) 03/28/2014    Past Surgical History:  Procedure Laterality Date  . BREAST BIOPSY Left    02/28/14 negative  . BREAST BIOPSY Right     02/2014 malignant  . COLONOSCOPY WITH PROPOFOL N/A 11/13/2018   Procedure: COLONOSCOPY WITH PROPOFOL;  Surgeon: Jonathon Bellows, MD;  Location: [facility] ENDOSCOPY;  Service: Gastroenterology;  Laterality: N/A;  . MASTECTOMY Right 2016   BREAST CA  . TUBAL LIGATION      Prior to Admission medications   Medication Sig Start Date End Date Taking? Authorizing Provider  Betamethasone Valerate 0.12 % foam Apply 1 application topically 2 (two) times daily. Do not use for more than 2  weeks 05/24/19   Melynda Ripple, MD  fluticasone [facility]) 50 MCG/ACT nasal spray Place 1 spray into both nostrils daily for 5 days. Patient not taking: Reported on 04/20/2019 01/24/19 01/29/19  Lannie Fields, PA-C  hydrOXYzine (ATARAX/VISTARIL) 25 MG tablet Take 1 tablet (25 mg total) by mouth every 6 (six) hours as needed for itching. 05/24/19   Melynda Ripple, MD  naproxen (NAPROSYN) 500 MG tablet Take 1 tablet (500 mg total) by mouth 2 (two) times daily with a meal. 08/21/19   Lavonia Drafts, MD  predniSONE (STERAPRED UNI-PAK 21 TAB) 10 MG (21) TBPK tablet Dispense one 6 day pack. Take as directed with food. 05/24/19   Melynda Ripple, MD  albuterol (PROVENTIL HFA;VENTOLIN HFA) 108 (90 Base) MCG/ACT inhaler Inhale 2 puffs into the lungs every 6 (six) hours as needed for wheezing or shortness of breath. Patient not taking: Reported on 04/20/2019 03/23/18 05/24/19  Juline Patch, MD  ipratropium-albuterol (DUONEB) 0.5-2.5 (3) MG/3ML SOLN Take 3 mLs by nebulization every 6 (six) hours as needed. 03/23/18 12/27/18  Juline Patch, MD     Allergies Tramadol  Family History  Problem Relation Age of Onset  . Cancer Mother   . Breast cancer Mother 36  . Stroke Brother   . Cancer Maternal Aunt     Social History Social History   Tobacco Use  . Smoking status: Former Smoker  Years: 15.00  . Smokeless tobacco: Former Network engineer  . Vaping Use: Former  Substance Use Topics  . Alcohol use: No  . Drug use: No    Review of Systems  Constitutional: No fever/chills Eyes: No visual changes.  ENT: No sore throat. Cardiovascular: As above Respiratory: Denies shortness of breath. Gastrointestinal: No abdominal pain.  No nausea, no vomiting.   Genitourinary: Negative for dysuria. Musculoskeletal: Negative for back pain. Skin: Negative for rash. Neurological: Negative for headaches   ____________________________________________   PHYSICAL EXAM:  VITAL SIGNS: ED Triage  Vitals [08/21/19 1629]  Enc Vitals Group     BP (!) 145/90     Pulse Rate (!) 109     Resp 20     Temp 99.1 F (37.3 C)     Temp Source Oral     SpO2 96 %     Weight 91.6 kg (202 lb)     Height 1.651 m (5\' 5" )     Head Circumference      Peak Flow      Pain Score 8     Pain Loc      Pain Edu?      Excl. in [place]?     Constitutional: Alert and oriented.   Nose: No congestion/rhinnorhea. Mouth/Throat: Mucous membranes are moist.   Neck:  Painless ROM Cardiovascular: Mild tachycardia, regular rhythm. Grossly normal heart sounds.  Good peripheral circulation.  Point tenderness to palpation approximately 2 inches lateral to the mid sternum.  Patient is tender in this area, and palpation there will make her tearful from pain. Respiratory: Normal respiratory effort.  No retractions. Lungs CTAB. Gastrointestinal: Soft and nontender. No distention.    Musculoskeletal: No lower extremity tenderness nor edema.  Warm and well perfused Neurologic:  Normal speech and language. No gross focal neurologic deficits are appreciated.  Skin:  Skin is warm, dry and intact. No rash noted. Psychiatric: Mood and affect are normal. Speech and behavior are normal.  ____________________________________________   LABS (all labs ordered are listed, but only abnormal results are displayed)  Labs Reviewed  CBC - Abnormal; Notable for the following components:      Result Value   WBC 14.8 (*)    All other components within normal limits  BASIC METABOLIC PANEL  POC URINE PREG, ED  TROPONIN I (HIGH SENSITIVITY)  TROPONIN I (HIGH SENSITIVITY)  TROPONIN I (HIGH SENSITIVITY)  TROPONIN I (HIGH SENSITIVITY)   ____________________________________________  EKG  ED ECG REPORT I, Lavonia Drafts, the attending physician, personally viewed and interpreted this ECG.  Date: 08/21/2019  Rhythm: Sinus tachycardia QRS Axis: normal Intervals: normal ST/T Wave abnormalities: normal Narrative Interpretation: no  evidence of acute ischemia  ____________________________________________  RADIOLOGY  Chest x-ray reviewed by me, no infiltrates edema or effusion ____________________________________________   PROCEDURES  Procedure(s) performed: No  Procedures   Critical Care performed: No ____________________________________________   INITIAL IMPRESSION / ASSESSMENT AND PLAN / ED COURSE  Pertinent labs & imaging results that were available during my care of the patient were reviewed by me and considered in my medical decision making (see chart for details).  Patient presents with right-sided chest pain is described above.  Differential includes musculoskeletal chest wall pain, less likely ACS, less likely PE.  Not consistent with pneumonia  Lab work thus far is notable for mild elevation of white blood cell count.  Initial troponin of 3, EKG demonstrates mild sinus tachycardia.  We will treat with IV Toradol.  Obtain CT  angiography given mildly elevated white blood cell count and continued tachycardia  Second troponin is 11, however patient symptoms do not seem consistent with ACS.  We will send third troponin while we await CT angiography results.   Third troponin of 6, CT angiography is normal.  Patient is feeling better after Toradol.  Appropriate for discharge at this time with outpatient follow-up, return precautions discussed   ____________________________________________   FINAL CLINICAL IMPRESSION(S) / ED DIAGNOSES  Final diagnoses:  Atypical chest pain  Chest wall pain        Note:  This document was prepared using Dragon voice recognition software and may include unintentional dictation errors.   Lavonia Drafts, MD 08/21/19 2145

## 2019-08-21 NOTE — ED Triage Notes (Signed)
Pt presents to ED via POV with c/o R side chest pain, pt states pain started yesterday, reports pain starts where she had R breast removed. Pt states took 2 baer aspirin yesterday when pain started and again today without relief.

## 2019-10-18 NOTE — Progress Notes (Signed)
[facility]  [address], [address] [place], [place] [address] Phone: [phone]  Fax: [phone]   Clinic Day:  10/22/2019  Referring physician: Juline Patch, MD  Chief Complaint: Jill Walsh is a 49 y.o. 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   HPI: The patient was last seen in the medical oncology clinic on 04/23/2019 via telemedicine. At that time, she was doing well.  She denied any breast concerns.  She had recurrent axillary hidradenitis suppurativa. Hematocrit was 38.3, hemoglobin 12.5, platelets 295,000, WBC 8,800. CMP was normal. CA27.29 was 19.5.  She was seen in the [facility] ER on 08/21/2019 with right sided chest pain.  Exam revealed point tenderness 2 inches lateral to the mid sternum.  Serial troponins were negative.  Chest CT angiogram revealed no pulmonary embolus.  There was mild pulmonary atelectasis, mild calcified coronary artery atherosclerosis, and hepatic steatosis.  She received Toradol.  During the interim, she has been good. She reports that her hidradenitis suppurativa drained and went away, but came back.  She is working day shift now, which she is very happy about. However, she is not sleeping any better. She tries to sleep on her right side. She denies and breast concerns.  She states that her friend from work passed away from lung cancer last week. She has another friend who is having half of his liver removed today.   Past Medical History:  Diagnosis Date  . Breast cancer ([place]) 2016   RT MASTECTOMY    Past Surgical History:  Procedure Laterality Date  . BREAST BIOPSY Left    02/28/14 negative  . BREAST BIOPSY Right     02/2014 malignant  . COLONOSCOPY WITH PROPOFOL N/A 11/13/2018   Procedure: COLONOSCOPY WITH PROPOFOL;  Surgeon: Jonathon Bellows, MD;  Location: [facility] ENDOSCOPY;  Service: Gastroenterology;  Laterality: N/A;  . MASTECTOMY Right 2016    BREAST CA  . TUBAL LIGATION      Family History  Problem Relation Age of Onset  . Cancer Mother   . Breast cancer Mother 51  . Stroke Brother   . Cancer Maternal Aunt     Social History:  reports that she has quit smoking. She quit after 15.00 years of use. She has quit using smokeless tobacco. She reports that she does not drink alcohol and does not use drugs. 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 She recently sold a home and just moved into her new home. She is getting married on 06/20/2019 and she is going on a honeymoon to [place]. The patient is alone today.  Allergies:  Allergies  Allergen Reactions  . Tramadol Other (See Comments)    Real bad headache    Current Medications: Current Outpatient Medications  Medication Sig Dispense Refill  . hydrOXYzine (ATARAX/VISTARIL) 25 MG tablet Take 1 tablet (25 mg total) by mouth every 6 (six) hours as needed for itching. 20 tablet 0  . predniSONE (STERAPRED UNI-PAK 21 TAB) 10 MG (21) TBPK tablet Dispense one 6 day pack. Take as directed with food. 21 tablet 0  . Betamethasone Valerate 0.12 % foam Apply 1 application topically 2 (two) times daily. Do not use for more than 2 weeks 100 g 0   No current facility-administered medications for this visit.     Review of Systems  Constitutional: Positive for weight loss (17 lbs since 05/2019).  Negative for chills, diaphoresis, fever and malaise/fatigue.       Feels good.  HENT: Negative for congestion, ear discharge, ear pain, hearing loss, nosebleeds, sinus pain, sore throat and tinnitus.   Eyes: Negative.  Negative for blurred vision, double vision, photophobia and pain.  Respiratory: Negative.  Negative for cough, hemoptysis, sputum production and shortness of breath.   Cardiovascular: Negative.  Negative for chest pain, palpitations, orthopnea and PND.  Gastrointestinal: Negative.  Negative for abdominal pain, blood  in stool, constipation, diarrhea, heartburn, melena, nausea and vomiting.  Genitourinary: Negative.  Negative for dysuria, frequency, hematuria and urgency.  Musculoskeletal: Negative.  Negative for back pain, joint pain, myalgias and neck pain.  Skin: Negative for itching and rash.       Hidradenitis suppurtiva.  Neurological: Negative.  Negative for dizziness, sensory change, speech change, focal weakness, weakness and headaches.  Endo/Heme/Allergies: Negative.  Does not bruise/bleed easily.  Psychiatric/Behavioral: Negative for depression and memory loss. The patient has insomnia. The patient is not nervous/anxious.   All other systems reviewed and are negative.   Performance status (ECOG): 1  Physical Exam Vitals and nursing note reviewed.  Constitutional:      General: She is not in acute distress.    Appearance: She is well-developed. She is not diaphoretic.  HENT:     Head: Normocephalic and atraumatic.     Comments: Long curly hair.    Mouth/Throat:     Mouth: Mucous membranes are moist.     Pharynx: Oropharynx is clear.  Eyes:     General: No scleral icterus.    Conjunctiva/sclera: Conjunctivae normal.     Comments: Brown eyes.   Cardiovascular:     Rate and Rhythm: Normal rate and regular rhythm.     Heart sounds: Normal heart sounds. No murmur heard.   Pulmonary:     Effort: Pulmonary effort is normal. No respiratory distress.     Breath sounds: Normal breath sounds. No wheezing or rales.  Chest:     Chest wall: No tenderness.     Breasts:        Left: No mass, nipple discharge or skin change.     Comments: Right mastectomy without erythema or nodularity. Abdominal:     General: Bowel sounds are normal. There is no distension.     Palpations: Abdomen is soft. There is no hepatomegaly, splenomegaly or mass.     Tenderness: There is no abdominal tenderness. There is no guarding or rebound.  Musculoskeletal:        General: No swelling or tenderness. Normal range  of motion.     Cervical back: Normal range of motion and neck supple.  Lymphadenopathy:     Head:     Right side of head: No preauricular, posterior auricular or occipital adenopathy.     Left side of head: No preauricular, posterior auricular or occipital adenopathy.     Cervical: No cervical adenopathy.     Upper Body:     Right upper body: No supraclavicular or axillary adenopathy.     Left upper body: No supraclavicular or axillary adenopathy.     Lower Body: No right inguinal adenopathy. No left inguinal adenopathy.  Skin:    General: Skin is warm and dry.     Coloration: Skin is not pale.     Findings: No bruising, erythema or lesion.     Comments:  Area underneath left arm has improved significantly.  Neurological:     Mental Status: She is alert  and oriented to person, place, and time.  Psychiatric:        Behavior: Behavior normal.        Thought Content: Thought content normal.        Judgment: Judgment normal.    Appointment on 10/22/2019  Component Date Value Ref Range Status  . Sodium 10/22/2019 139  135 - 145 mmol/L Final  . Potassium 10/22/2019 4.0  3.5 - 5.1 mmol/L Final  . Chloride 10/22/2019 104  98 - 111 mmol/L Final  . CO2 10/22/2019 26  22 - 32 mmol/L Final  . Glucose, Bld 10/22/2019 110* 70 - 99 mg/dL Final   Glucose reference range applies only to samples taken after fasting for at least 8 hours.  . BUN 10/22/2019 16  6 - 20 mg/dL Final  . Creatinine, Ser 10/22/2019 0.94  0.44 - 1.00 mg/dL Final  . Calcium 10/22/2019 9.2  8.9 - 10.3 mg/dL Final  . Total Protein 10/22/2019 7.9  6.5 - 8.1 g/dL Final  . Albumin 10/22/2019 4.1  3.5 - 5.0 g/dL Final  . AST 10/22/2019 20  15 - 41 U/L Final  . ALT 10/22/2019 24  0 - 44 U/L Final  . Alkaline Phosphatase 10/22/2019 74  38 - 126 U/L Final  . Total Bilirubin 10/22/2019 0.4  0.3 - 1.2 mg/dL Final  . GFR calc non Af Amer 10/22/2019 >60  >60 mL/min Final  . GFR calc Af Amer 10/22/2019 >60  >60 mL/min Final  . Anion  gap 10/22/2019 9  5 - 15 Final   Performed at [facility], [address] [place], [place] [address]  . WBC 10/22/2019 10.7* 4.0 - 10.5 K/uL Final  . RBC 10/22/2019 4.66  3.87 - 5.11 MIL/uL Final  . Hemoglobin 10/22/2019 12.7  12.0 - 15.0 g/dL Final  . HCT 10/22/2019 38.8  36 - 46 % Final  . MCV 10/22/2019 83.3  80.0 - 100.0 fL Final  . MCH 10/22/2019 27.3  26.0 - 34.0 pg Final  . MCHC 10/22/2019 32.7  30.0 - 36.0 g/dL Final  . RDW 10/22/2019 15.2  11.5 - 15.5 % Final  . Platelets 10/22/2019 281  150 - 400 K/uL Final  . nRBC 10/22/2019 0.0  0.0 - 0.2 % Final  . Neutrophils Relative % 10/22/2019 64  % Final  . Neutro Abs 10/22/2019 6.9  1.7 - 7.7 K/uL Final  . Lymphocytes Relative 10/22/2019 27  % Final  . Lymphs Abs 10/22/2019 2.9  0.7 - 4.0 K/uL Final  . Monocytes Relative 10/22/2019 6  % Final  . Monocytes Absolute 10/22/2019 0.7  0 - 1 K/uL Final  . Eosinophils Relative 10/22/2019 2  % Final  . Eosinophils Absolute 10/22/2019 0.2  0 - 0 K/uL Final  . Basophils Relative 10/22/2019 0  % Final  . Basophils Absolute 10/22/2019 0.0  0 - 0 K/uL Final  . Immature Granulocytes 10/22/2019 1  % Final  . Abs Immature Granulocytes 10/22/2019 0.05  0.00 - 0.07 K/uL Final   Performed at [facility], [address], [place], [place] [address]    Assessment:  Jill Walsh is a 49 y.o. female with Her2neu+ multi-focal microinvasive right breast cancerstatus post [facility] 03/28/2014. No lymph nodes were removed. Tumor was ER/PR negative and Her2/neu 3+. Pathologic stage was [id number].   Left mammogramand ultrasound on 12/06/2014 revealed no mammographic or sonographic evidence of malignancy in the left breast. There was an incidental note of two less than 4  mm benign appearing cysts at the 9:30 o'clock 9 cm and 10 cm from the nipple. Left screening mammogramon 12/09/2016 revealed no mammographic evidence of malignancy. Left screening mammogramon 12/12/2017  revealed no evidence of malignancy.  Left screening mammogram on 12/14/2018 revealed no evidence of malignancy.   CA27.29has been followed: 24.4 on 04/30/2014, 18.0 on 11/25/2014, 26.8 on 03/14/2015, 20.6 on 07/14/2015, 19.1 on 12/11/2015, 21.6 on 04/15/2016, 18.6 on 10/18/2016, 17.6 on 03/29/2017, 16.8 on 09/26/2017, 13.2 on 04/05/2018, 13.1 on 10/23/2018, 19.5 on 04/19/2019, and 26.8 on 10/22/2019.  MyRisk genetic testingrevealed no clinically significant mutation. She has a mother who developed breast cancer at age 74. A maternal aunt has colon cancer. Colonoscopy on 11/13/2018 was normal.  She received the [place] COVID-19 vaccine on 05/25/2019 and 06/19/2019.  Symptomatically, she feels good.  She denies any breast concerns.  Exam is unremarkable.  Plan: 1.   Labs today: CBC with diff, CMP, CA27.29 2. Her2neu+ multi-focal microinvasive right breast cancer: Clinically, she is doing well.             Exam reveals no evidence of recurrent disease.             Left mammogram on 12/14/2018 revealed no evidence of malignancy. Continue surveillance. 3.Provide patient with a note for work. 4.   Left mammogram on 12/14/2019. 5.   RTC in 6 months for MD assessment, labs (CBC with diff, CMP, CA 27.29) and review of mammogram.  I discussed the assessment and treatment plan with the patient.  The patient was provided an opportunity to ask questions and all were answered.  The patient agreed with the plan and demonstrated an understanding of the instructions.  The patient was advised to call back or seek an in person evaluation if the symptoms worsen or if the condition fails to improve as anticipated.  I provided 17 minutes of face-to-face time during this this encounter and > 50% was spent counseling as documented under my assessment and plan.  An additional 5 minutes were spent reviewing her chart (Epic and Care Everywhere) including notes, labs, and imaging  studies.    Nolon Stalls, MD, PhD  10/22/2019, 10:00 AM  I, Mirian Mo Tufford, am acting as Education administrator for [employer]. Mike Gip, MD, PhD.  I, Zerline Melchior C. Mike Gip, MD, have reviewed the above documentation for accuracy and completeness, and I agree with the above.

## 2019-10-22 ENCOUNTER — Encounter: Payer: Self-pay | Admitting: Hematology and Oncology

## 2019-10-22 ENCOUNTER — Other Ambulatory Visit: Payer: Self-pay

## 2019-10-22 ENCOUNTER — Other Ambulatory Visit: Payer: 59

## 2019-10-22 ENCOUNTER — Inpatient Hospital Stay: Payer: 59 | Attending: Hematology and Oncology

## 2019-10-22 ENCOUNTER — Inpatient Hospital Stay (HOSPITAL_BASED_OUTPATIENT_CLINIC_OR_DEPARTMENT_OTHER): Payer: 59 | Admitting: Hematology and Oncology

## 2019-10-22 ENCOUNTER — Ambulatory Visit: Payer: 59 | Admitting: Hematology and Oncology

## 2019-10-22 VITALS — BP 140/89 | HR 89 | Temp 97.0°F | Resp 18 | Ht 65.0 in | Wt 211.2 lb

## 2019-10-22 DIAGNOSIS — Z87891 Personal history of nicotine dependence: Secondary | ICD-10-CM | POA: Diagnosis not present

## 2019-10-22 DIAGNOSIS — L732 Hidradenitis suppurativa: Secondary | ICD-10-CM | POA: Diagnosis not present

## 2019-10-22 DIAGNOSIS — Z9011 Acquired absence of right breast and nipple: Secondary | ICD-10-CM | POA: Diagnosis not present

## 2019-10-22 DIAGNOSIS — Z171 Estrogen receptor negative status [ER-]: Secondary | ICD-10-CM | POA: Diagnosis not present

## 2019-10-22 DIAGNOSIS — C50911 Malignant neoplasm of unspecified site of right female breast: Secondary | ICD-10-CM | POA: Diagnosis present

## 2019-10-22 LAB — CBC WITH DIFFERENTIAL/PLATELET
Abs Immature Granulocytes: 0.05 10*3/uL (ref 0.00–0.07)
Basophils Absolute: 0 10*3/uL (ref 0.0–0.1)
Basophils Relative: 0 %
Eosinophils Absolute: 0.2 10*3/uL (ref 0.0–0.5)
Eosinophils Relative: 2 %
HCT: 38.8 % (ref 36.0–46.0)
Hemoglobin: 12.7 g/dL (ref 12.0–15.0)
Immature Granulocytes: 1 %
Lymphocytes Relative: 27 %
Lymphs Abs: 2.9 10*3/uL (ref 0.7–4.0)
MCH: 27.3 pg (ref 26.0–34.0)
MCHC: 32.7 g/dL (ref 30.0–36.0)
MCV: 83.3 fL (ref 80.0–100.0)
Monocytes Absolute: 0.7 10*3/uL (ref 0.1–1.0)
Monocytes Relative: 6 %
Neutro Abs: 6.9 10*3/uL (ref 1.7–7.7)
Neutrophils Relative %: 64 %
Platelets: 281 10*3/uL (ref 150–400)
RBC: 4.66 MIL/uL (ref 3.87–5.11)
RDW: 15.2 % (ref 11.5–15.5)
WBC: 10.7 10*3/uL — ABNORMAL HIGH (ref 4.0–10.5)
nRBC: 0 % (ref 0.0–0.2)

## 2019-10-22 LAB — COMPREHENSIVE METABOLIC PANEL
ALT: 24 U/L (ref 0–44)
AST: 20 U/L (ref 15–41)
Albumin: 4.1 g/dL (ref 3.5–5.0)
Alkaline Phosphatase: 74 U/L (ref 38–126)
Anion gap: 9 (ref 5–15)
BUN: 16 mg/dL (ref 6–20)
CO2: 26 mmol/L (ref 22–32)
Calcium: 9.2 mg/dL (ref 8.9–10.3)
Chloride: 104 mmol/L (ref 98–111)
Creatinine, Ser: 0.94 mg/dL (ref 0.44–1.00)
GFR calc Af Amer: >60 mL/min (ref >60)
GFR calc non Af Amer: >60 mL/min (ref >60)
Glucose, Bld: 110 mg/dL — ABNORMAL HIGH (ref 70–99)
Potassium: 4 mmol/L (ref 3.5–5.1)
Sodium: 139 mmol/L (ref 135–145)
Total Bilirubin: 0.4 mg/dL (ref 0.3–1.2)
Total Protein: 7.9 g/dL (ref 6.5–8.1)

## 2019-10-22 NOTE — Progress Notes (Signed)
No new changes noted today 

## 2019-10-23 LAB — CANCER ANTIGEN 27.29: CA 27.29: 26.8 U/mL (ref 0.0–38.6)

## 2019-12-18 ENCOUNTER — Other Ambulatory Visit: Payer: Self-pay

## 2019-12-18 ENCOUNTER — Ambulatory Visit
Admission: RE | Admit: 2019-12-18 | Discharge: 2019-12-18 | Disposition: A | Discharge status: Home / Self Care | Payer: 59 | Source: Ambulatory Visit | Attending: Hematology and Oncology | Admitting: Hematology and Oncology

## 2019-12-18 DIAGNOSIS — Z9011 Acquired absence of right breast and nipple: Secondary | ICD-10-CM | POA: Diagnosis not present

## 2019-12-18 DIAGNOSIS — Z171 Estrogen receptor negative status [ER-]: Secondary | ICD-10-CM | POA: Diagnosis not present

## 2019-12-18 DIAGNOSIS — Z1231 Encounter for screening mammogram for malignant neoplasm of breast: Secondary | ICD-10-CM | POA: Diagnosis not present

## 2019-12-18 DIAGNOSIS — C50911 Malignant neoplasm of unspecified site of right female breast: Secondary | ICD-10-CM | POA: Diagnosis not present

## 2020-04-10 NOTE — Progress Notes (Incomplete)
[facility]  [address], [address] [place], [place] [address] Phone: [phone]  Fax: [phone]   Clinic Day:  04/10/2020  Referring physician: Juline Patch, MD  Chief Complaint: Jill Walsh is a 50 y.o. 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   HPI: The patient was last seen in the medical oncology clinic on 10/22/2019. At that time, she felt good.  She denied any breast concerns.  Exam was unremarkable. Hematocrit was 38.8, hemoglobin 12.7, platelets 281,000, WBC 10,700. CMP was normal. CA27.29 was 26.8.  Left screening mammogram on 12/18/2019 revealed no evidence of malignancy.  During the interim, ***   Past Medical History:  Diagnosis Date  . Breast cancer ([place]) 2016   RT MASTECTOMY    Past Surgical History:  Procedure Laterality Date  . BREAST BIOPSY Left    02/28/14 negative  . BREAST BIOPSY Right     02/2014 malignant  . COLONOSCOPY WITH PROPOFOL N/A 11/13/2018   Procedure: COLONOSCOPY WITH PROPOFOL;  Surgeon: Jonathon Bellows, MD;  Location: [facility] ENDOSCOPY;  Service: Gastroenterology;  Laterality: N/A;  . MASTECTOMY Right 2016   BREAST CA  . TUBAL LIGATION      Family History  Problem Relation Age of Onset  . Cancer Mother   . Breast cancer Mother 3  . Stroke Brother   . Cancer Maternal Aunt     Social History:  reports that she has quit smoking. She quit after 15.00 years of use. She has quit using smokeless tobacco. She reports that she does not drink alcohol and does not use drugs. 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 She recently sold a home and just moved into her new home. She is getting married on 06/20/2019 and she is going on a honeymoon to [place]. The patient is alone*** today.  Allergies:  Allergies  Allergen Reactions  . Tramadol Other (See Comments)    Real bad headache     Current Medications: Current Outpatient Medications  Medication Sig Dispense Refill  . Betamethasone Valerate 0.12 % foam Apply 1 application topically 2 (two) times daily. Do not use for more than 2 weeks 100 g 0  . hydrOXYzine (ATARAX/VISTARIL) 25 MG tablet Take 1 tablet (25 mg total) by mouth every 6 (six) hours as needed for itching. 20 tablet 0  . predniSONE (STERAPRED UNI-PAK 21 TAB) 10 MG (21) TBPK tablet Dispense one 6 day pack. Take as directed with food. 21 tablet 0   No current facility-administered medications for this visit.     Review of Systems  Constitutional: Positive for weight loss (17 lbs since 05/2019). Negative for chills, diaphoresis, fever and malaise/fatigue.       Feels good.  HENT: Negative for congestion, ear discharge, ear pain, hearing loss, nosebleeds, sinus pain, sore throat and tinnitus.   Eyes: Negative.  Negative for blurred vision, double vision, photophobia and pain.  Respiratory: Negative.  Negative for cough, hemoptysis, sputum production and shortness of breath.   Cardiovascular: Negative.  Negative for chest pain, palpitations, orthopnea and PND.  Gastrointestinal: Negative.  Negative for abdominal pain, blood in stool, constipation, diarrhea, heartburn, melena, nausea and vomiting.  Genitourinary: Negative.  Negative for dysuria, frequency, hematuria and urgency.  Musculoskeletal: Negative.  Negative for back pain, joint pain, myalgias and neck pain.  Skin: Negative for itching and rash.  Hidradenitis suppurtiva.  Neurological: Negative.  Negative for dizziness, sensory change, speech change, focal weakness, weakness and headaches.  Endo/Heme/Allergies: Negative.  Does not bruise/bleed easily.  Psychiatric/Behavioral: Negative for depression and memory loss. The patient has insomnia. The patient is not nervous/anxious.   All other systems reviewed and are negative.   Performance status (ECOG): 1***  Vital Signs There were no vitals  taken for this visit.   Physical Exam Vitals and nursing note reviewed.  Constitutional:      General: She is not in acute distress.    Appearance: She is well-developed. She is not diaphoretic.  HENT:     Head: Normocephalic and atraumatic.     Comments: Long curly hair.    Mouth/Throat:     Mouth: Mucous membranes are moist.     Pharynx: Oropharynx is clear.  Eyes:     General: No scleral icterus.    Conjunctiva/sclera: Conjunctivae normal.     Comments: Brown eyes.   Cardiovascular:     Rate and Rhythm: Normal rate and regular rhythm.     Heart sounds: Normal heart sounds. No murmur heard.   Pulmonary:     Effort: Pulmonary effort is normal. No respiratory distress.     Breath sounds: Normal breath sounds. No wheezing or rales.  Chest:     Chest wall: No tenderness.  Breasts:     Right: No axillary adenopathy or supraclavicular adenopathy.     Left: No mass, nipple discharge, skin change, axillary adenopathy or supraclavicular adenopathy.      Comments: Right mastectomy without erythema or nodularity. Abdominal:     General: Bowel sounds are normal. There is no distension.     Palpations: Abdomen is soft. There is no hepatomegaly, splenomegaly or mass.     Tenderness: There is no abdominal tenderness. There is no guarding or rebound.  Musculoskeletal:        General: No swelling or tenderness. Normal range of motion.     Cervical back: Normal range of motion and neck supple.  Lymphadenopathy:     Head:     Right side of head: No preauricular, posterior auricular or occipital adenopathy.     Left side of head: No preauricular, posterior auricular or occipital adenopathy.     Cervical: No cervical adenopathy.     Upper Body:     Right upper body: No supraclavicular or axillary adenopathy.     Left upper body: No supraclavicular or axillary adenopathy.     Lower Body: No right inguinal adenopathy. No left inguinal adenopathy.  Skin:    General: Skin is warm and dry.      Coloration: Skin is not pale.     Findings: No bruising, erythema or lesion.     Comments:  Area underneath left arm has improved significantly.  Neurological:     Mental Status: She is alert and oriented to person, place, and time.  Psychiatric:        Behavior: Behavior normal.        Thought Content: Thought content normal.        Judgment: Judgment normal.    No visits with results within 3 Day(s) from this visit.  Latest known visit with results is:  Appointment on 10/22/2019  Component Date Value Ref Range Status  . CA 27.29 10/22/2019 26.8  0.0 - 38.6 U/mL Final   Comment: (NOTE) Siemens Centaur Immunochemiluminometric Methodology [facility]) Values obtained with different assay methods or kits cannot be used interchangeably. Results cannot be  interpreted as absolute evidence of the presence or absence of malignant disease. Performed At: [facility] [place], [place] [id number] Rush Farmer MD MW:[id number]   . Sodium 10/22/2019 139  135 - 145 mmol/L Final  . Potassium 10/22/2019 4.0  3.5 - 5.1 mmol/L Final  . Chloride 10/22/2019 104  98 - 111 mmol/L Final  . CO2 10/22/2019 26  22 - 32 mmol/L Final  . Glucose, Bld 10/22/2019 110* 70 - 99 mg/dL Final   Glucose reference range applies only to samples taken after fasting for at least 8 hours.  . BUN 10/22/2019 16  6 - 20 mg/dL Final  . Creatinine, Ser 10/22/2019 0.94  0.44 - 1.00 mg/dL Final  . Calcium 10/22/2019 9.2  8.9 - 10.3 mg/dL Final  . Total Protein 10/22/2019 7.9  6.5 - 8.1 g/dL Final  . Albumin 10/22/2019 4.1  3.5 - 5.0 g/dL Final  . AST 10/22/2019 20  15 - 41 U/L Final  . ALT 10/22/2019 24  0 - 44 U/L Final  . Alkaline Phosphatase 10/22/2019 74  38 - 126 U/L Final  . Total Bilirubin 10/22/2019 0.4  0.3 - 1.2 mg/dL Final  . GFR calc non Af Amer 10/22/2019 >60  >60 mL/min Final  . GFR calc Af Amer 10/22/2019 >60  >60 mL/min Final  . Anion gap 10/22/2019 9  5 - 15 Final   Performed at  [facility], [address], [place], [place] [address]  . WBC 10/22/2019 10.7* 4.0 - 10.5 K/uL Final  . RBC 10/22/2019 4.66  3.87 - 5.11 MIL/uL Final  . Hemoglobin 10/22/2019 12.7  12.0 - 15.0 g/dL Final  . HCT 10/22/2019 38.8  36.0 - 46.0 % Final  . MCV 10/22/2019 83.3  80.0 - 100.0 fL Final  . MCH 10/22/2019 27.3  26.0 - 34.0 pg Final  . MCHC 10/22/2019 32.7  30.0 - 36.0 g/dL Final  . RDW 10/22/2019 15.2  11.5 - 15.5 % Final  . Platelets 10/22/2019 281  150 - 400 K/uL Final  . nRBC 10/22/2019 0.0  0.0 - 0.2 % Final  . Neutrophils Relative % 10/22/2019 64  % Final  . Neutro Abs 10/22/2019 6.9  1.7 - 7.7 K/uL Final  . Lymphocytes Relative 10/22/2019 27  % Final  . Lymphs Abs 10/22/2019 2.9  0.7 - 4.0 K/uL Final  . Monocytes Relative 10/22/2019 6  % Final  . Monocytes Absolute 10/22/2019 0.7  0.1 - 1.0 K/uL Final  . Eosinophils Relative 10/22/2019 2  % Final  . Eosinophils Absolute 10/22/2019 0.2  0.0 - 0.5 K/uL Final  . Basophils Relative 10/22/2019 0  % Final  . Basophils Absolute 10/22/2019 0.0  0.0 - 0.1 K/uL Final  . Immature Granulocytes 10/22/2019 1  % Final  . Abs Immature Granulocytes 10/22/2019 0.05  0.00 - 0.07 K/uL Final   Performed at [facility], [address], [place], [place] [address]    Assessment:  Jill Walsh is a 50 y.o. female with Her2neu+ multi-focal microinvasive right breast cancerstatus post [facility] 03/28/2014. No lymph nodes were removed. Tumor was ER/PR negative and Her2/neu 3+. Pathologic stage was T46mNx.   Left mammogramand ultrasound on 12/06/2014 revealed no mammographic or sonographic evidence of malignancy in the left breast. There was an incidental note of two less than 4 mm benign appearing cysts at the 9:30 o'clock 9 cm and 10 cm from the nipple. Left screening mammogramon 12/09/2016 revealed no mammographic evidence of malignancy.  Left screening mammogramon 12/12/2017 revealed no evidence of  malignancy.  Left screening mammogram on 12/14/2018 revealed no evidence of malignancy.  Left screening mammogram on 12/18/2019 revealed no evidence of malignancy.  CA27.29has been followed (0-38.6): 24.4 on 04/30/2014, 18.0 on 11/25/2014, 26.8 on 03/14/2015, 20.6 on 07/14/2015, 19.1 on 12/11/2015, 21.6 on 04/15/2016, 18.6 on 10/18/2016, 17.6 on 03/29/2017, 16.8 on 09/26/2017, 13.2 on 04/05/2018, 13.1 on 10/23/2018, 19.5 on 04/19/2019, and 26.8 on 10/22/2019.  MyRisk genetic testingrevealed no clinically significant mutation. She has a mother who developed breast cancer at age 43. A maternal aunt has colon cancer. Colonoscopy on 11/13/2018 was normal.  She received the [place] COVID-19 vaccine on 05/25/2019 and 06/19/2019.  Symptomatically, ***  Plan: 1.   Labs today: CBC with diff, CMP, CA 27.29   2. Her2neu+ multi-focal microinvasive right breast cancer: Clinically, she is doing well.             Exam reveals no evidence of recurrent disease.             Left mammogram on 12/14/2018 revealed no evidence of malignancy. Continue surveillance. 3.Provide patient with a note for work. 4.   Left mammogram on 12/14/2019. 5.   RTC in 6 months for MD assessment, labs (CBC with diff, CMP, CA 27.29) and review of mammogram.  I discussed the assessment and treatment plan with the patient.  The patient was provided an opportunity to ask questions and all were answered.  The patient agreed with the plan and demonstrated an understanding of the instructions.  The patient was advised to call back or seek an in person evaluation if the symptoms worsen or if the condition fails to improve as anticipated.  I provided *** minutes of face-to-face time during this this encounter and > 50% was spent counseling as documented under my assessment and plan.   Nolon Stalls, MD, PhD  04/10/2020, 9:54 AM  I, Mirian Mo Tufford, am acting as Education administrator for [employer]. Mike Gip, MD,  PhD.  I, Melissa C. Mike Gip, MD, have reviewed the above documentation for accuracy and completeness, and I agree with the above.

## 2020-04-14 ENCOUNTER — Inpatient Hospital Stay: Payer: 59

## 2020-04-14 ENCOUNTER — Telehealth: Payer: Self-pay | Admitting: Oncology

## 2020-04-14 ENCOUNTER — Inpatient Hospital Stay: Payer: 59 | Attending: Hematology and Oncology | Admitting: Hematology and Oncology

## 2020-04-14 DIAGNOSIS — C50911 Malignant neoplasm of unspecified site of right female breast: Secondary | ICD-10-CM

## 2020-04-14 NOTE — Telephone Encounter (Signed)
Returned call to patient with no answer. Unable to leave a message due to VM  being full.

## 2020-05-06 ENCOUNTER — Inpatient Hospital Stay: Payer: 59 | Attending: Hematology and Oncology

## 2020-05-06 ENCOUNTER — Encounter: Payer: Self-pay | Admitting: Hematology and Oncology

## 2020-05-06 ENCOUNTER — Other Ambulatory Visit: Payer: Self-pay

## 2020-05-06 ENCOUNTER — Inpatient Hospital Stay (HOSPITAL_BASED_OUTPATIENT_CLINIC_OR_DEPARTMENT_OTHER): Payer: 59 | Admitting: Hematology and Oncology

## 2020-05-06 VITALS — BP 142/87 | HR 99 | Temp 97.8°F | Resp 18 | Wt 207.2 lb

## 2020-05-06 DIAGNOSIS — Z171 Estrogen receptor negative status [ER-]: Secondary | ICD-10-CM

## 2020-05-06 DIAGNOSIS — Z800 Family history of malignant neoplasm of digestive organs: Secondary | ICD-10-CM | POA: Diagnosis not present

## 2020-05-06 DIAGNOSIS — C50911 Malignant neoplasm of unspecified site of right female breast: Secondary | ICD-10-CM | POA: Diagnosis not present

## 2020-05-06 DIAGNOSIS — Z87891 Personal history of nicotine dependence: Secondary | ICD-10-CM | POA: Insufficient documentation

## 2020-05-06 DIAGNOSIS — Z803 Family history of malignant neoplasm of breast: Secondary | ICD-10-CM | POA: Insufficient documentation

## 2020-05-06 DIAGNOSIS — Z853 Personal history of malignant neoplasm of breast: Secondary | ICD-10-CM | POA: Diagnosis present

## 2020-05-06 DIAGNOSIS — Z9011 Acquired absence of right breast and nipple: Secondary | ICD-10-CM | POA: Diagnosis not present

## 2020-05-06 LAB — CBC WITH DIFFERENTIAL/PLATELET
Abs Immature Granulocytes: 0.03 10*3/uL (ref 0.00–0.07)
Basophils Absolute: 0 10*3/uL (ref 0.0–0.1)
Basophils Relative: 0 %
Eosinophils Absolute: 0.3 10*3/uL (ref 0.0–0.5)
Eosinophils Relative: 3 %
HCT: 36.4 % (ref 36.0–46.0)
Hemoglobin: 12.2 g/dL (ref 12.0–15.0)
Immature Granulocytes: 0 %
Lymphocytes Relative: 36 %
Lymphs Abs: 3.3 10*3/uL (ref 0.7–4.0)
MCH: 27.7 pg (ref 26.0–34.0)
MCHC: 33.5 g/dL (ref 30.0–36.0)
MCV: 82.5 fL (ref 80.0–100.0)
Monocytes Absolute: 0.5 10*3/uL (ref 0.1–1.0)
Monocytes Relative: 5 %
Neutro Abs: 5.1 10*3/uL (ref 1.7–7.7)
Neutrophils Relative %: 56 %
Platelets: 277 10*3/uL (ref 150–400)
RBC: 4.41 MIL/uL (ref 3.87–5.11)
RDW: 14.4 % (ref 11.5–15.5)
WBC: 9.2 10*3/uL (ref 4.0–10.5)
nRBC: 0 % (ref 0.0–0.2)

## 2020-05-06 LAB — COMPREHENSIVE METABOLIC PANEL
ALT: 23 U/L (ref 0–44)
AST: 24 U/L (ref 15–41)
Albumin: 4 g/dL (ref 3.5–5.0)
Alkaline Phosphatase: 72 U/L (ref 38–126)
Anion gap: 7 (ref 5–15)
BUN: 14 mg/dL (ref 6–20)
CO2: 26 mmol/L (ref 22–32)
Calcium: 9.4 mg/dL (ref 8.9–10.3)
Chloride: 103 mmol/L (ref 98–111)
Creatinine, Ser: 0.94 mg/dL (ref 0.44–1.00)
GFR, Estimated: >60 mL/min (ref >60)
Glucose, Bld: 125 mg/dL — ABNORMAL HIGH (ref 70–99)
Potassium: 3.4 mmol/L — ABNORMAL LOW (ref 3.5–5.1)
Sodium: 136 mmol/L (ref 135–145)
Total Bilirubin: 0.2 mg/dL — ABNORMAL LOW (ref 0.3–1.2)
Total Protein: 8 g/dL (ref 6.5–8.1)

## 2020-05-06 NOTE — Progress Notes (Signed)
[facility]  [address], [address] [place], [place] [address] Phone: [phone]  Fax: [phone]   Clinic Day:  05/06/2020  Referring physician: Juline Patch, MD  Chief Complaint: Jill Walsh is a 50 y.o. 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   HPI: The patient was last seen in the medical oncology clinic on 10/22/2019. At that time, she felt good.  She denied any breast concerns.  Exam was unremarkable. Hematocrit was 38.8, hemoglobin 12.7, platelets 281,000, WBC 10,700. CMP was normal. CA27.29 was 26.8.  Left screening mammogram on 12/18/2019 revealed no evidence of malignancy.  During the interim, she has been "pretty good." She has right breast pain and itching. She is thinking about getting her left breast removed because it is heavy. Her surgeon retired.  She denies nausea, vomiting, diarrhea, bone pain, and any other symptoms.   Past Medical History:  Diagnosis Date  . Breast cancer ([place]) 2016   RT MASTECTOMY    Past Surgical History:  Procedure Laterality Date  . BREAST BIOPSY Left    02/28/14 negative  . BREAST BIOPSY Right     02/2014 malignant  . COLONOSCOPY WITH PROPOFOL N/A 11/13/2018   Procedure: COLONOSCOPY WITH PROPOFOL;  Surgeon: Jonathon Bellows, MD;  Location: [facility] ENDOSCOPY;  Service: Gastroenterology;  Laterality: N/A;  . MASTECTOMY Right 2016   BREAST CA  . TUBAL LIGATION      Family History  Problem Relation Age of Onset  . Cancer Mother   . Breast cancer Mother 68  . Stroke Brother   . Cancer Maternal Aunt     Social History:  reports that she has quit smoking. She quit after 15.00 years of use. She has quit using smokeless tobacco. She reports that she does not drink alcohol and does not use drugs. 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 She got married on 04/19/2020.  The patient is alone today.  Allergies:  Allergies  Allergen Reactions  . Tramadol Other (See Comments)    Real bad headache    Current Medications: Current Outpatient Medications  Medication Sig Dispense Refill  . diphenhydramine-acetaminophen (TYLENOL PM) 25-500 MG TABS tablet Take 1 tablet by mouth at bedtime as needed.    . hydrOXYzine (ATARAX/VISTARIL) 25 MG tablet Take 1 tablet (25 mg total) by mouth every 6 (six) hours as needed for itching. 20 tablet 0  . ibuprofen (ADVIL) 200 MG tablet Take 200 mg by mouth every 6 (six) hours as needed.     No current facility-administered medications for this visit.    Review of Systems  Constitutional: Positive for weight loss (4 lbs). Negative for chills, diaphoresis, fever and malaise/fatigue.       Feels "pretty good."  HENT: Negative.  Negative for congestion, ear discharge, ear pain, hearing loss, nosebleeds, sinus pain, sore throat and tinnitus.   Eyes: Negative.  Negative for blurred vision, double vision, photophobia and pain.  Respiratory: Negative.  Negative for cough, hemoptysis, sputum production and shortness of breath.   Cardiovascular: Negative.  Negative for chest pain, palpitations, orthopnea and PND.  Gastrointestinal: Negative.  Negative for abdominal pain, blood in stool, constipation, diarrhea, heartburn, melena, nausea and vomiting.  Genitourinary: Negative.  Negative for dysuria, frequency, hematuria and urgency.  Musculoskeletal: Negative for back pain, joint pain, myalgias and neck pain.       Right breast pain.  Skin: Positive for itching (right breast). Negative for rash.       Hidradenitis suppurtiva.  Neurological: Negative.  Negative for dizziness, sensory change, speech change, focal weakness, weakness and headaches.  Endo/Heme/Allergies: Negative.  Does not bruise/bleed easily.  Psychiatric/Behavioral: Negative.  Negative for depression and memory loss. The patient is not nervous/anxious and does not have  insomnia.   All other systems reviewed and are negative.   Performance status (ECOG): 0  Vital Signs Blood pressure (!) 142/87, pulse 99, temperature 97.8 F (36.6 C), temperature source Tympanic, resp. rate 18, weight 207 lb 3.7 oz (94 kg), SpO2 100 %.  Physical Exam Vitals and nursing note reviewed.  Constitutional:      General: She is not in acute distress.    Appearance: She is well-developed. She is not diaphoretic.  HENT:     Head: Normocephalic and atraumatic.     Comments: Long curly hair.    Mouth/Throat:     Mouth: Mucous membranes are moist.     Pharynx: Oropharynx is clear.  Eyes:     General: No scleral icterus.    Conjunctiva/sclera: Conjunctivae normal.     Comments: Brown eyes.   Cardiovascular:     Rate and Rhythm: Normal rate and regular rhythm.     Heart sounds: Normal heart sounds. No murmur heard.   Pulmonary:     Effort: Pulmonary effort is normal. No respiratory distress.     Breath sounds: Normal breath sounds. No wheezing or rales.  Chest:     Chest wall: No tenderness.  Breasts:     Right: Tenderness present. No swelling, bleeding, mass, skin change, axillary adenopathy or supraclavicular adenopathy.     Left: No swelling, bleeding, mass, skin change, tenderness, axillary adenopathy or supraclavicular adenopathy.    Abdominal:     General: Bowel sounds are normal. There is no distension.     Palpations: Abdomen is soft. There is no hepatomegaly, splenomegaly or mass.     Tenderness: There is no abdominal tenderness. There is no guarding or rebound.  Musculoskeletal:        General: No swelling or tenderness. Normal range of motion.     Cervical back: Normal range of motion and neck supple.  Lymphadenopathy:     Head:     Right side of head: No preauricular, posterior auricular or occipital adenopathy.     Left side of head: No preauricular, posterior auricular or occipital adenopathy.     Cervical: No cervical adenopathy.     Upper Body:      Right upper body: No supraclavicular or axillary adenopathy.     Left upper body: No supraclavicular or axillary adenopathy.     Lower Body: No right inguinal adenopathy. No left inguinal adenopathy.  Skin:    General: Skin is warm and dry.     Coloration: Skin is not pale.     Findings: No bruising, erythema or lesion.  Neurological:     Mental Status: She is alert and oriented to person, place, and time.  Psychiatric:        Behavior: Behavior normal.        Thought Content: Thought content normal.        Judgment: Judgment normal.    Appointment on 05/06/2020  Component Date Value Ref Range Status  . Sodium 05/06/2020 136  135 - 145 mmol/L Final  . Potassium 05/06/2020 3.4* 3.5 - 5.1 mmol/L Final  . Chloride 05/06/2020 103  98 - 111 mmol/L Final  .  CO2 05/06/2020 26  22 - 32 mmol/L Final  . Glucose, Bld 05/06/2020 125* 70 - 99 mg/dL Final   Glucose reference range applies only to samples taken after fasting for at least 8 hours.  . BUN 05/06/2020 14  6 - 20 mg/dL Final  . Creatinine, Ser 05/06/2020 0.94  0.44 - 1.00 mg/dL Final  . Calcium 05/06/2020 9.4  8.9 - 10.3 mg/dL Final  . Total Protein 05/06/2020 8.0  6.5 - 8.1 g/dL Final  . Albumin 05/06/2020 4.0  3.5 - 5.0 g/dL Final  . AST 05/06/2020 24  15 - 41 U/L Final  . ALT 05/06/2020 23  0 - 44 U/L Final  . Alkaline Phosphatase 05/06/2020 72  38 - 126 U/L Final  . Total Bilirubin 05/06/2020 0.2* 0.3 - 1.2 mg/dL Final  . GFR, Estimated 05/06/2020 >60  >60 mL/min Final   Comment: (NOTE) Calculated using the CKD-EPI Creatinine Equation (2021)   . Anion gap 05/06/2020 7  5 - 15 Final   Performed at [facility], [address], [place], [place] [address]  . WBC 05/06/2020 9.2  4.0 - 10.5 K/uL Final  . RBC 05/06/2020 4.41  3.87 - 5.11 MIL/uL Final  . Hemoglobin 05/06/2020 12.2  12.0 - 15.0 g/dL Final  . HCT 05/06/2020 36.4  36.0 - 46.0 % Final  . MCV 05/06/2020 82.5  80.0 - 100.0 fL Final  . MCH 05/06/2020  27.7  26.0 - 34.0 pg Final  . MCHC 05/06/2020 33.5  30.0 - 36.0 g/dL Final  . RDW 05/06/2020 14.4  11.5 - 15.5 % Final  . Platelets 05/06/2020 277  150 - 400 K/uL Final  . nRBC 05/06/2020 0.0  0.0 - 0.2 % Final  . Neutrophils Relative % 05/06/2020 56  % Final  . Neutro Abs 05/06/2020 5.1  1.7 - 7.7 K/uL Final  . Lymphocytes Relative 05/06/2020 36  % Final  . Lymphs Abs 05/06/2020 3.3  0.7 - 4.0 K/uL Final  . Monocytes Relative 05/06/2020 5  % Final  . Monocytes Absolute 05/06/2020 0.5  0.1 - 1.0 K/uL Final  . Eosinophils Relative 05/06/2020 3  % Final  . Eosinophils Absolute 05/06/2020 0.3  0.0 - 0.5 K/uL Final  . Basophils Relative 05/06/2020 0  % Final  . Basophils Absolute 05/06/2020 0.0  0.0 - 0.1 K/uL Final  . Immature Granulocytes 05/06/2020 0  % Final  . Abs Immature Granulocytes 05/06/2020 0.03  0.00 - 0.07 K/uL Final   Performed at [facility], [address] [place], [place] [address]    Assessment:  Jill Walsh is a 50 y.o. female with Her2neu+ multi-focal microinvasive right breast cancerstatus post [facility] 03/28/2014. No lymph nodes were removed. Tumor was ER/PR negative and Her2/neu 3+. Pathologic stage was T34mNx.   Left mammogramand ultrasound on 12/06/2014 revealed no mammographic or sonographic evidence of malignancy in the left breast. There was an incidental note of two less than 4 mm benign appearing cysts at the 9:30 o'clock 9 cm and 10 cm from the nipple. Left screening mammogramon 12/09/2016 revealed no mammographic evidence of malignancy. Left screening mammogramon 12/12/2017 revealed no evidence of malignancy.  Left screening mammogram on 12/14/2018 revealed no evidence of malignancy.  Left screening mammogram on 12/18/2019 revealed no evidence of malignancy.  CA27.29has been followed (0-38.6): 24.4 on 04/30/2014, 18.0 on 11/25/2014, 26.8 on 03/14/2015, 20.6 on 07/14/2015, 19.1 on 12/11/2015, 21.6 on 04/15/2016, 18.6 on  10/18/2016, 17.6 on 03/29/2017, 16.8 on 09/26/2017, 13.2 on  04/05/2018, 13.1 on 10/23/2018, 19.5 on 04/19/2019, 26.8 on 10/22/2019, and 18.8 on 05/06/2020.  MyRisk genetic testingrevealed no clinically significant mutation. She has a mother who developed breast cancer at age 37. A maternal aunt has colon cancer. Colonoscopy on 11/13/2018 was normal.  She received the [place] COVID-19 vaccine on 05/25/2019 and 06/19/2019.  Symptomatically, she feels "pretty good." She has chronic right breast pain and itching. She is considering a left simple mastectomy.  Exam is stable.  Plan: 1.   Labs today: CBC with diff, CMP, CA 27.29. 2. Her2neu+ multi-focal microinvasive right breast cancer: Clinically, she is doing well.             Exam reveals no evidence of recurrent disease.             Left mammogram on 12/18/2019 revealed no evidence of malignancy. Continue surveillance. 3.   Surgery consult [facility]).  Discuss patient's thoughts about left sided mastectomy. 4.   RTC in 6 months for MD assessment and labs (CBC with diff, CMP, CA27.29).  I discussed the assessment and treatment plan with the patient.  The patient was provided an opportunity to ask questions and all were answered.  The patient agreed with the plan and demonstrated an understanding of the instructions.  The patient was advised to call back or seek an in person evaluation if the symptoms worsen or if the condition fails to improve as anticipated.  I provided 26 minutes of face-to-face time during this this encounter and > 50% was spent counseling as documented under my assessment and plan.  An additional 5 minutes were spent reviewing her chart (Epic and Care Everywhere) including notes, labs, and imaging studies.    Nolon Stalls, MD, PhD  05/06/2020, 3:24 PM  I, Mirian Mo Tufford, am acting as Education administrator for [employer]. Mike Gip, MD, PhD.  I, Jeanmarc Viernes C. Mike Gip, MD, have reviewed the above  documentation for accuracy and completeness, and I agree with the above.

## 2020-05-06 NOTE — Progress Notes (Signed)
Last Wednesday right arm pit pain.

## 2020-05-07 ENCOUNTER — Telehealth: Payer: Self-pay | Admitting: *Deleted

## 2020-05-07 LAB — CANCER ANTIGEN 27.29: CA 27.29: 18.8 U/mL (ref 0.0–38.6)

## 2020-05-07 NOTE — Telephone Encounter (Signed)
Referral faxed to [facility]- Dr. Bary Castilla

## 2020-05-13 ENCOUNTER — Ambulatory Visit: Payer: Self-pay | Admitting: Surgery

## 2020-05-22 ENCOUNTER — Other Ambulatory Visit: Payer: Self-pay

## 2020-05-22 ENCOUNTER — Encounter: Payer: Self-pay | Admitting: *Deleted

## 2020-05-22 ENCOUNTER — Encounter
Admission: RE | Admit: 2020-05-22 | Discharge: 2020-05-22 | Disposition: A | Discharge status: Home / Self Care | Payer: 59 | Source: Ambulatory Visit | Attending: Surgery | Admitting: Surgery

## 2020-05-22 HISTORY — DX: Unspecified asthma, uncomplicated: J45.909

## 2020-05-22 NOTE — Patient Instructions (Signed)
Your procedure is scheduled on:05-29-20 THURSDAY Report to the Registration Desk on the 1st floor of the [facility]-Then proceed to the 2nd floor Surgery Desk in the [facility] To find out your arrival time, please call [phone] between 1PM - 3PM on:05-28-20 WEDNESDAY  REMEMBER: Instructions that are not followed completely may result in serious medical risk, up to and including death; or upon the discretion of your surgeon and anesthesiologist your surgery may need to be rescheduled.  Do not eat food after midnight the night before surgery.  No gum chewing, lozengers or hard candies.  You may however, drink CLEAR liquids up to 2 hours before you are scheduled to arrive for your surgery. Do not drink anything within 2 hours of your scheduled arrival time.  Clear liquids include: - water  - apple juice without pulp - gatorade  - black coffee or tea (Do NOT add milk or creamers to the coffee or tea) Do NOT drink anything that is not on this list.  DO NOT TAKE ANY MEDICATION THE DAY OF SURGERY  USE YOUR ALBUTEROL NEBULIZER THE MORNING OF YOUR SURGERY  One week prior to surgery: Stop Anti-inflammatories (NSAIDS) such as Advil, Aleve, Ibuprofen, Motrin, Naproxen, Naprosyn and Aspirin based products such as Excedrin, Goodys Powder, BC Powder-OK TO TAKE TYLENOL IF NEEDED  Stop ANY OVER THE COUNTER supplements until after surgery.  No Alcohol for 24 hours before or after surgery.  No Smoking including e-cigarettes for 24 hours prior to surgery.  No chewable tobacco products for at least 6 hours prior to surgery.  No nicotine patches on the day of surgery.  Do not use any "recreational" drugs for at least a week prior to your surgery.  Please be advised that the combination of cocaine and anesthesia may have negative outcomes, up to and including death. If you test positive for cocaine, your surgery will be cancelled.  On the morning of surgery brush your teeth with toothpaste  and water, you may rinse your mouth with mouthwash if you wish. Do not swallow any toothpaste or mouthwash.  Do not wear jewelry, make-up, hairpins, clips or nail polish.  Do not wear lotions, powders, or perfumes.   Do not shave body from the neck down 48 hours prior to surgery just in case you cut yourself which could leave a site for infection.  Also, freshly shaved skin may become irritated if using the CHG soap.  Contact lenses, hearing aids and dentures may not be worn into surgery.  Do not bring valuables to the hospital. [facility] is not responsible for any missing/lost belongings or valuables.   Use CHG Soap as directed on instruction sheet.  Notify your doctor if there is any change in your medical condition (cold, fever, infection).  Wear comfortable clothing (specific to your surgery type) to the hospital.  Plan for stool softeners for home use; pain medications have a tendency to cause constipation. You can also help prevent constipation by eating foods high in fiber such as fruits and vegetables and drinking plenty of fluids as your diet allows.  After surgery, you can help prevent lung complications by doing breathing exercises.  Take deep breaths and cough every 1-2 hours. Your doctor may order a device called an Incentive Spirometer to help you take deep breaths. When coughing or sneezing, hold a pillow firmly against your incision with both hands. This is called "splinting." Doing this helps protect your incision. It also decreases belly discomfort.  If you are  being admitted to the hospital overnight, leave your suitcase in the car. After surgery it may be brought to your room.  If you are being discharged the day of surgery, you will not be allowed to drive home. You will need a responsible adult (18 years or older) to drive you home and stay with you that night.   If you are taking public transportation, you will need to have a responsible adult (18 years or  older) with you. Please confirm with your physician that it is acceptable to use public transportation.   Please call the [facility] at [phone] if you have any questions about these instructions.  Surgery Visitation Policy:  Patients undergoing a surgery or procedure may have one family member or support person with them as long as that person is not COVID-19 positive or experiencing its symptoms.  That person may remain in the waiting area during the procedure.  Inpatient Visitation:    Visiting hours are 7 a.m. to 8 p.m. Inpatients will be allowed two visitors daily. The visitors may change each day during the patient's stay. No visitors under the age of 45. Any visitor under the age of 72 must be accompanied by an adult. The visitor must pass COVID-19 screenings, use hand sanitizer when entering and exiting the patient's room and wear a mask at all times, including in the patient's room. Patients must also wear a mask when staff or their visitor are in the room. Masking is required regardless of vaccination status.

## 2020-05-27 ENCOUNTER — Other Ambulatory Visit: Admission: RE | Admit: 2020-05-27 | Payer: 59 | Source: Ambulatory Visit

## 2020-05-27 ENCOUNTER — Other Ambulatory Visit: Payer: Self-pay

## 2020-05-27 ENCOUNTER — Other Ambulatory Visit
Admission: RE | Admit: 2020-05-27 | Discharge: 2020-05-27 | Disposition: A | Discharge status: Home / Self Care | Payer: 59 | Source: Ambulatory Visit | Attending: Surgery | Admitting: Surgery

## 2020-05-27 DIAGNOSIS — Z01812 Encounter for preprocedural laboratory examination: Secondary | ICD-10-CM | POA: Insufficient documentation

## 2020-05-27 DIAGNOSIS — Z20822 Contact with and (suspected) exposure to covid-19: Secondary | ICD-10-CM | POA: Insufficient documentation

## 2020-05-27 LAB — SARS CORONAVIRUS 2 (TAT 6-24 HRS): SARS Coronavirus 2: NEGATIVE

## 2020-05-29 ENCOUNTER — Encounter
Admission: RE | Disposition: A | Discharge status: Home / Self Care | Payer: Self-pay | Source: Ambulatory Visit | Attending: Surgery

## 2020-05-29 ENCOUNTER — Ambulatory Visit: Payer: 59 | Admitting: Anesthesiology

## 2020-05-29 ENCOUNTER — Observation Stay
Admission: RE | Admit: 2020-05-29 | Discharge: 2020-05-30 | Disposition: A | Discharge status: Home / Self Care | Payer: 59 | Source: Ambulatory Visit | Attending: Surgery | Admitting: Surgery

## 2020-05-29 ENCOUNTER — Other Ambulatory Visit: Payer: Self-pay

## 2020-05-29 ENCOUNTER — Encounter: Payer: Self-pay | Admitting: Surgery

## 2020-05-29 DIAGNOSIS — Z7951 Long term (current) use of inhaled steroids: Secondary | ICD-10-CM | POA: Insufficient documentation

## 2020-05-29 DIAGNOSIS — Z853 Personal history of malignant neoplasm of breast: Secondary | ICD-10-CM | POA: Insufficient documentation

## 2020-05-29 DIAGNOSIS — N644 Mastodynia: Secondary | ICD-10-CM | POA: Diagnosis present

## 2020-05-29 DIAGNOSIS — J45909 Unspecified asthma, uncomplicated: Secondary | ICD-10-CM | POA: Diagnosis not present

## 2020-05-29 HISTORY — PX: TOTAL MASTECTOMY: SHX6129

## 2020-05-29 LAB — CBC
HCT: 37.2 % (ref 36.0–46.0)
Hemoglobin: 12.2 g/dL (ref 12.0–15.0)
MCH: 27.8 pg (ref 26.0–34.0)
MCHC: 32.8 g/dL (ref 30.0–36.0)
MCV: 84.7 fL (ref 80.0–100.0)
Platelets: 269 10*3/uL (ref 150–400)
RBC: 4.39 MIL/uL (ref 3.87–5.11)
RDW: 14.2 % (ref 11.5–15.5)
WBC: 17.9 10*3/uL — ABNORMAL HIGH (ref 4.0–10.5)
nRBC: 0 % (ref 0.0–0.2)

## 2020-05-29 LAB — CREATININE, SERUM
Creatinine, Ser: 0.98 mg/dL (ref 0.44–1.00)
GFR, Estimated: >60 mL/min (ref >60)

## 2020-05-29 SURGERY — MASTECTOMY, SIMPLE
Anesthesia: General | Site: Breast | Laterality: Left

## 2020-05-29 MED ORDER — BUPIVACAINE-EPINEPHRINE (PF) 0.5% -1:200000 IJ SOLN
INTRAMUSCULAR | Status: DC | PRN
  Administered 2020-05-29: 30 mL

## 2020-05-29 MED ORDER — SODIUM CHLORIDE 0.9 % IV SOLN
INTRAVENOUS | Status: DC | PRN
  Administered 2020-05-29: 70 mL

## 2020-05-29 MED ORDER — MIDAZOLAM HCL 2 MG/2ML IJ SOLN
INTRAMUSCULAR | Status: AC
  Filled 2020-05-29: qty 4

## 2020-05-29 MED ORDER — FAMOTIDINE 20 MG PO TABS: 20.0000 mg | ORAL_TABLET | Freq: Once | ORAL | Status: AC

## 2020-05-29 MED ORDER — IPRATROPIUM-ALBUTEROL 0.5-2.5 (3) MG/3ML IN SOLN
3.0000 mL | RESPIRATORY_TRACT | Status: DC
  Administered 2020-05-29: 3 mL via RESPIRATORY_TRACT

## 2020-05-29 MED ORDER — ACETAMINOPHEN 500 MG PO TABS: 1000.0000 mg | ORAL_TABLET | ORAL | Status: AC

## 2020-05-29 MED ORDER — DEXMEDETOMIDINE HCL 200 MCG/2ML IV SOLN
INTRAVENOUS | Status: DC | PRN
  Administered 2020-05-29: 8 ug via INTRAVENOUS

## 2020-05-29 MED ORDER — DROPERIDOL 2.5 MG/ML IJ SOLN
0.6250 mg | Freq: Once | INTRAMUSCULAR | Status: DC | PRN
  Filled 2020-05-29: qty 2

## 2020-05-29 MED ORDER — DIPHENHYDRAMINE-APAP (SLEEP) 25-500 MG PO TABS: 1.0000 | ORAL_TABLET | Freq: Every day | ORAL | Status: DC

## 2020-05-29 MED ORDER — CHLORHEXIDINE GLUCONATE 0.12 % MT SOLN: 15.0000 mL | Freq: Once | OROMUCOSAL | Status: AC

## 2020-05-29 MED ORDER — GABAPENTIN 300 MG PO CAPS: 300.0000 mg | ORAL_CAPSULE | ORAL | Status: AC

## 2020-05-29 MED ORDER — IBUPROFEN 400 MG PO TABS: 400.0000 mg | ORAL_TABLET | Freq: Four times a day (QID) | ORAL | Status: DC | PRN

## 2020-05-29 MED ORDER — IPRATROPIUM-ALBUTEROL 0.5-2.5 (3) MG/3ML IN SOLN
RESPIRATORY_TRACT | Status: AC
  Filled 2020-05-29: qty 3

## 2020-05-29 MED ORDER — CHLORHEXIDINE GLUCONATE CLOTH 2 % EX PADS: 6.0000 | MEDICATED_PAD | Freq: Once | CUTANEOUS | Status: DC

## 2020-05-29 MED ORDER — ACETAMINOPHEN 325 MG PO TABS: 650.0000 mg | ORAL_TABLET | Freq: Four times a day (QID) | ORAL | Status: DC | PRN

## 2020-05-29 MED ORDER — OXYCODONE HCL 5 MG PO TABS: 5.0000 mg | ORAL_TABLET | Freq: Four times a day (QID) | ORAL | Status: DC | PRN

## 2020-05-29 MED ORDER — ENOXAPARIN SODIUM 40 MG/0.4ML ~~LOC~~ SOLN
40.0000 mg | SUBCUTANEOUS | Status: DC
  Filled 2020-05-29: qty 0.4

## 2020-05-29 MED ORDER — ONDANSETRON HCL 4 MG/2ML IJ SOLN
INTRAMUSCULAR | Status: DC | PRN
  Administered 2020-05-29 (×2): 4 mg via INTRAVENOUS

## 2020-05-29 MED ORDER — FENTANYL CITRATE (PF) 100 MCG/2ML IJ SOLN
INTRAMUSCULAR | Status: AC
  Administered 2020-05-29: 50 ug via INTRAVENOUS
  Filled 2020-05-29: qty 2

## 2020-05-29 MED ORDER — DEXAMETHASONE SODIUM PHOSPHATE 10 MG/ML IJ SOLN
INTRAMUSCULAR | Status: DC | PRN
  Administered 2020-05-29: 10 mg via INTRAVENOUS

## 2020-05-29 MED ORDER — ALBUTEROL SULFATE (2.5 MG/3ML) 0.083% IN NEBU: 2.5000 mg | INHALATION_SOLUTION | Freq: Four times a day (QID) | RESPIRATORY_TRACT | Status: DC | PRN

## 2020-05-29 MED ORDER — LIDOCAINE HCL (CARDIAC) PF 100 MG/5ML IV SOSY
PREFILLED_SYRINGE | INTRAVENOUS | Status: DC | PRN
  Administered 2020-05-29: 100 mg via INTRAVENOUS

## 2020-05-29 MED ORDER — HYDROXYZINE HCL 25 MG PO TABS: 25.0000 mg | ORAL_TABLET | Freq: Four times a day (QID) | ORAL | Status: DC | PRN

## 2020-05-29 MED ORDER — HYDROMORPHONE HCL 1 MG/ML IJ SOLN
INTRAMUSCULAR | Status: AC
  Filled 2020-05-29: qty 1

## 2020-05-29 MED ORDER — GABAPENTIN 300 MG PO CAPS
ORAL_CAPSULE | ORAL | Status: AC
  Administered 2020-05-29: 300 mg via ORAL
  Filled 2020-05-29: qty 1

## 2020-05-29 MED ORDER — ONDANSETRON 4 MG PO TBDP: 4.0000 mg | ORAL_TABLET | Freq: Four times a day (QID) | ORAL | Status: DC | PRN

## 2020-05-29 MED ORDER — FENTANYL CITRATE (PF) 100 MCG/2ML IJ SOLN
INTRAMUSCULAR | Status: DC | PRN
  Administered 2020-05-29: 25 ug via INTRAVENOUS
  Administered 2020-05-29: 50 ug via INTRAVENOUS
  Administered 2020-05-29: 25 ug via INTRAVENOUS
  Administered 2020-05-29: 50 ug via INTRAVENOUS
  Administered 2020-05-29 (×2): 25 ug via INTRAVENOUS
  Administered 2020-05-29: 50 ug via INTRAVENOUS

## 2020-05-29 MED ORDER — SODIUM CHLORIDE (PF) 0.9 % IJ SOLN
INTRAMUSCULAR | Status: AC
  Filled 2020-05-29: qty 50

## 2020-05-29 MED ORDER — SODIUM CHLORIDE FLUSH 0.9 % IV SOLN
INTRAVENOUS | Status: AC
  Filled 2020-05-29: qty 10

## 2020-05-29 MED ORDER — CEFAZOLIN SODIUM-DEXTROSE 2-4 GM/100ML-% IV SOLN
INTRAVENOUS | Status: AC
  Filled 2020-05-29: qty 100

## 2020-05-29 MED ORDER — ACETAMINOPHEN 500 MG PO TABS
ORAL_TABLET | ORAL | Status: AC
  Administered 2020-05-29: 1000 mg via ORAL
  Filled 2020-05-29: qty 2

## 2020-05-29 MED ORDER — DEXMEDETOMIDINE (PRECEDEX) IN NS 20 MCG/5ML (4 MCG/ML) IV SYRINGE
PREFILLED_SYRINGE | INTRAVENOUS | Status: AC
  Filled 2020-05-29: qty 5

## 2020-05-29 MED ORDER — MIDAZOLAM HCL 2 MG/2ML IJ SOLN
INTRAMUSCULAR | Status: DC | PRN
  Administered 2020-05-29: 3 mg via INTRAVENOUS
  Administered 2020-05-29: 1 mg via INTRAVENOUS

## 2020-05-29 MED ORDER — FENTANYL CITRATE (PF) 100 MCG/2ML IJ SOLN
25.0000 ug | INTRAMUSCULAR | Status: DC | PRN
  Administered 2020-05-29: 50 ug via INTRAVENOUS

## 2020-05-29 MED ORDER — PROMETHAZINE HCL 25 MG/ML IJ SOLN: 6.2500 mg | INTRAMUSCULAR | Status: DC | PRN

## 2020-05-29 MED ORDER — ONDANSETRON HCL 4 MG/2ML IJ SOLN: 4.0000 mg | Freq: Four times a day (QID) | INTRAMUSCULAR | Status: DC | PRN

## 2020-05-29 MED ORDER — CHLORHEXIDINE GLUCONATE 0.12 % MT SOLN
OROMUCOSAL | Status: AC
  Administered 2020-05-29: 15 mL via OROMUCOSAL
  Filled 2020-05-29: qty 15

## 2020-05-29 MED ORDER — PROPOFOL 10 MG/ML IV BOLUS
INTRAVENOUS | Status: DC | PRN
  Administered 2020-05-29: 200 mg via INTRAVENOUS
  Administered 2020-05-29: 30 mg via INTRAVENOUS

## 2020-05-29 MED ORDER — HYDROMORPHONE HCL 1 MG/ML IJ SOLN
0.5000 mg | INTRAMUSCULAR | Status: DC | PRN
  Administered 2020-05-29 (×2): 0.5 mg via INTRAVENOUS
  Filled 2020-05-29 (×2): qty 0.5

## 2020-05-29 MED ORDER — ORAL CARE MOUTH RINSE: 15.0000 mL | Freq: Once | OROMUCOSAL | Status: AC

## 2020-05-29 MED ORDER — CELECOXIB 200 MG PO CAPS
ORAL_CAPSULE | ORAL | Status: AC
  Administered 2020-05-29: 200 mg via ORAL
  Filled 2020-05-29: qty 1

## 2020-05-29 MED ORDER — BUPIVACAINE LIPOSOME 1.3 % IJ SUSP
INTRAMUSCULAR | Status: AC
  Filled 2020-05-29: qty 20

## 2020-05-29 MED ORDER — CEFAZOLIN SODIUM-DEXTROSE 2-4 GM/100ML-% IV SOLN
2.0000 g | INTRAVENOUS | Status: AC
  Administered 2020-05-29: 2 g via INTRAVENOUS

## 2020-05-29 MED ORDER — FENTANYL CITRATE (PF) 250 MCG/5ML IJ SOLN
INTRAMUSCULAR | Status: AC
  Filled 2020-05-29: qty 5

## 2020-05-29 MED ORDER — FAMOTIDINE 20 MG PO TABS
ORAL_TABLET | ORAL | Status: AC
  Administered 2020-05-29: 20 mg via ORAL
  Filled 2020-05-29: qty 1

## 2020-05-29 MED ORDER — ACETAMINOPHEN 325 MG PO TABS
325.0000 mg | ORAL_TABLET | ORAL | Status: DC | PRN
Start: 2020-05-29 — End: 2020-05-29

## 2020-05-29 MED ORDER — ACETAMINOPHEN 160 MG/5ML PO SOLN
325.0000 mg | ORAL | Status: DC | PRN
Start: 2020-05-29 — End: 2020-05-29
  Filled 2020-05-29: qty 20.3

## 2020-05-29 MED ORDER — HYDROMORPHONE HCL 1 MG/ML IJ SOLN
INTRAMUSCULAR | Status: DC | PRN
  Administered 2020-05-29: .5 mg via INTRAVENOUS
  Administered 2020-05-29 (×2): .25 mg via INTRAVENOUS

## 2020-05-29 MED ORDER — IPRATROPIUM-ALBUTEROL 0.5-2.5 (3) MG/3ML IN SOLN: 3.0000 mL | RESPIRATORY_TRACT | Status: DC

## 2020-05-29 MED ORDER — LACTATED RINGERS IV SOLN: INTRAVENOUS | Status: DC

## 2020-05-29 MED ORDER — CELECOXIB 200 MG PO CAPS: 200.0000 mg | ORAL_CAPSULE | ORAL | Status: AC

## 2020-05-29 SURGICAL SUPPLY — 61 items
APPLIER CLIP 11 MED OPEN (CLIP)
BINDER BREAST LRG (GAUZE/BANDAGES/DRESSINGS) IMPLANT
BINDER BREAST MEDIUM (GAUZE/BANDAGES/DRESSINGS) IMPLANT
BINDER BREAST XLRG (GAUZE/BANDAGES/DRESSINGS) IMPLANT
BINDER BREAST XXLRG (GAUZE/BANDAGES/DRESSINGS) IMPLANT
BLADE SURG 15 STRL LF DISP TIS (BLADE) ×1 IMPLANT
BLADE SURG 15 STRL SS (BLADE) ×1
BULB RESERV EVAC DRAIN JP 100C (MISCELLANEOUS) ×2 IMPLANT
CANISTER SUCT 1200ML W/VALVE (MISCELLANEOUS) ×2 IMPLANT
CHLORAPREP W/TINT 26 (MISCELLANEOUS) ×2 IMPLANT
CLIP APPLIE 11 MED OPEN (CLIP) IMPLANT
CNTNR SPEC 2.5X3XGRAD LEK (MISCELLANEOUS)
CONT SPEC 4OZ STER OR WHT (MISCELLANEOUS)
CONTAINER SPEC 2.5X3XGRAD LEK (MISCELLANEOUS) IMPLANT
COVER WAND RF STERILE (DRAPES) ×2 IMPLANT
DERMABOND ADVANCED (GAUZE/BANDAGES/DRESSINGS) ×2
DERMABOND ADVANCED .7 DNX12 (GAUZE/BANDAGES/DRESSINGS) ×2 IMPLANT
DEVICE DSSCT PLSMBLD 3.0S LGHT (MISCELLANEOUS) ×1 IMPLANT
DRAIN CHANNEL JP 15F RND 16 (MISCELLANEOUS) ×2 IMPLANT
DRAPE LAPAROTOMY TRNSV 106X77 (MISCELLANEOUS) ×2 IMPLANT
DRSG GAUZE FLUFF 36X18 (GAUZE/BANDAGES/DRESSINGS) ×2 IMPLANT
ELECT CAUTERY BLADE TIP 2.5 (TIP) ×2
ELECT REM PT RETURN 9FT ADLT (ELECTROSURGICAL) ×2
ELECTRODE CAUTERY BLDE TIP 2.5 (TIP) ×1 IMPLANT
ELECTRODE REM PT RTRN 9FT ADLT (ELECTROSURGICAL) ×1 IMPLANT
GAUZE SPONGE 4X4 12PLY STRL (GAUZE/BANDAGES/DRESSINGS) IMPLANT
GLOVE SURG SYN 6.5 ES PF (GLOVE) ×2 IMPLANT
GLOVE SURG UNDER POLY LF SZ7 (GLOVE) ×2 IMPLANT
GOWN STRL REUS W/ TWL LRG LVL3 (GOWN DISPOSABLE) ×2 IMPLANT
GOWN STRL REUS W/TWL LRG LVL3 (GOWN DISPOSABLE) ×2
KIT MARKER MARGIN INK (KITS) IMPLANT
KIT TURNOVER KIT A (KITS) ×2 IMPLANT
LABEL OR SOLS (LABEL) ×2 IMPLANT
LIGHT WAVEGUIDE WIDE FLAT (MISCELLANEOUS) IMPLANT
MANIFOLD NEPTUNE II (INSTRUMENTS) ×2 IMPLANT
NEEDLE HYPO 22GX1.5 SAFETY (NEEDLE) ×4 IMPLANT
PACK BASIN MINOR ARMC (MISCELLANEOUS) ×2 IMPLANT
PLASMABLADE 3.0S W/LIGHT (MISCELLANEOUS) ×2
SLEVE PROBE SENORX GAMMA FIND (MISCELLANEOUS) ×2 IMPLANT
SPONGE LAP 18X18 RF (DISPOSABLE) ×4 IMPLANT
STAPLER SKIN PROX 35W (STAPLE) IMPLANT
SUT ETHILON 3-0 FS-10 30 BLK (SUTURE) ×2
SUT MNCRL 4-0 (SUTURE) ×1
SUT MNCRL 4-0 27XMFL (SUTURE) ×1
SUT SILK 2 0 (SUTURE) ×1
SUT SILK 2 0 SH (SUTURE) ×2 IMPLANT
SUT SILK 2-0 30XBRD TIE 12 (SUTURE) ×1 IMPLANT
SUT SILK 3 0 12 30 (SUTURE) ×2 IMPLANT
SUT SILK 3 0 SH 30 (SUTURE) ×2 IMPLANT
SUT SILK 3-0 (SUTURE) ×4 IMPLANT
SUT VIC AB 2-0 SH 27 (SUTURE) ×1
SUT VIC AB 2-0 SH 27XBRD (SUTURE) ×1 IMPLANT
SUT VIC AB 3-0 SH 27 (SUTURE) ×4
SUT VIC AB 3-0 SH 27X BRD (SUTURE) ×4 IMPLANT
SUTURE EHLN 3-0 FS-10 30 BLK (SUTURE) ×1 IMPLANT
SUTURE MNCRL 4-0 27XMF (SUTURE) ×1 IMPLANT
SYR 10ML LL (SYRINGE) ×2 IMPLANT
SYR 20ML LL LF (SYRINGE) ×4 IMPLANT
SYR BULB IRRIG 60ML STRL (SYRINGE) ×2 IMPLANT
TUBING CONNECTING 10 (TUBING) ×2 IMPLANT
WATER STERILE IRR 1000ML POUR (IV SOLUTION) ×2 IMPLANT

## 2020-05-29 NOTE — H&P (Signed)
Subjective:   CC: Breast pain [N64.4] HPI: Jill Walsh is a 50 y.o. female who was referred by Gareth Morgan Corcor* for evaluation of above. Hx of right mastectomy for cancer. Here today to discuss left side mastectomy due to persistent discomfort and pain due to size of her left breast since surgery. She also has constant concerns of breast cancer on her left.  She specifically requesting mastectomy, and not breast reduction or other plastic surgery.  Past Medical History: has a past medical history of Asthma, unspecified asthma severity, unspecified whether complicated, unspecified whether persistent.  Past Surgical History: has a past surgical history that includes Mastectomy (Right, 2016).  Family History: family history includes Breast cancer in her mother; Colon cancer in her brother.  Social History: reports that she has quit smoking. Her smoking use included cigarettes. She has never used smokeless tobacco. She reports that she does not drink alcohol and does not use drugs.  Current Medications: has a current medication list which includes the following prescription(s): diphenhydramine-acetaminophen.  Allergies:  Allergies as of 05/13/2020  . (No Known Allergies)   ROS:  A 15 point review of systems was performed and was negative except as noted in HPI  Objective:    BP (!) 151/91  Pulse (!) 112  Ht 165.1 cm (5\' 5" )  Wt 93.4 kg (206 lb)  BMI 34.28 kg/m   Constitutional : alert, appears stated age, cooperative and no distress  Lymphatics/Throat: no asymmetry, masses, or scars  Respiratory: clear to auscultation bilaterally  Cardiovascular: regular rate and rhythm  Gastrointestinal: soft, non-tender; bowel sounds normal; no masses, no organomegaly.  Musculoskeletal: Steady gait and movement  Skin: Cool and moist  Psychiatric: Normal affect, non-agitated, not confused  Breast: Chaperone present for exam. breasts appear normal, no suspicious masses, no skin or  nipple changes or axillary nodes, left breast, pendulous and large, normal without mass, skin or nipple changes or axillary nodes.   LABS:  n/a   RADS: n/a  Assessment:   Breast pain [N64.4]  Pt requesting mastectomy for symptom relief  Plan:    1. Breast pain [N64.4] Discussed the risk of surgery including bleeding, chronic pain, post-op infxn, poor/delayed wound healing, poor cosmesis, seroma, hematoma formation, and possible re-operation to address said risks. The risks of general anesthetic, if used, includes MI, CVA, sudden death or even reaction to anesthetic medications also discussed.  Typical post-op recovery time and possbility of activity restrictions were also discussed. Alternatives include continued observation. Benefits include possible symptom relief.  The patient verbalized understanding and all questions were answered to the patient's satisfaction.  2. Patient has elected to proceed with surgical treatment. Procedure will be scheduled. Written consent was obtained. She understands this may be self pay. My portion of estimate provided. Encouraged family to call hospital for additional estimates. They verbalized understanding

## 2020-05-29 NOTE — Anesthesia Procedure Notes (Signed)
Procedure Name: LMA Insertion Date/Time: 05/29/2020 11:14 AM Performed by: Hedda Slade, CRNA Pre-anesthesia Checklist: Patient identified, Patient being monitored, Timeout performed, Emergency Drugs available and Suction available Patient Re-evaluated:Patient Re-evaluated prior to induction Oxygen Delivery Method: Circle system utilized Preoxygenation: Pre-oxygenation with 100% oxygen Induction Type: IV induction Ventilation: Mask ventilation without difficulty LMA: LMA inserted LMA Size: 5.0 Tube type: Oral Number of attempts: 1 Placement Confirmation: positive ETCO2 and breath sounds checked- equal and bilateral Tube secured with: Tape Dental Injury: Teeth and Oropharynx as per pre-operative assessment

## 2020-05-29 NOTE — Anesthesia Preprocedure Evaluation (Signed)
Anesthesia Evaluation  Patient identified by MRN, date of birth, ID band Patient awake    Reviewed: Allergy & Precautions, H&P , NPO status , reviewed documented beta blocker date and time   Airway Mallampati: III  TM Distance: >3 FB Neck ROM: full    Dental  (+) Upper Dentures, Lower Dentures   Pulmonary asthma , former smoker,  Asthma stable, no sx's at present   Pulmonary exam normal        Cardiovascular Normal cardiovascular exam     Neuro/Psych    GI/Hepatic neg GERD  ,  Endo/Other    Renal/GU      Musculoskeletal   Abdominal   Peds  Hematology   Anesthesia Other Findings Past Medical History: No date: Asthma 2016: Breast cancer ([place])     Comment:  RT MASTECTOMY Past Surgical History: No date: BREAST BIOPSY; Left     Comment:  02/28/14 negative No date: BREAST BIOPSY; Right     Comment:   02/2014 malignant 11/13/2018: COLONOSCOPY WITH PROPOFOL; N/A     Comment:  Procedure: COLONOSCOPY WITH PROPOFOL;  Surgeon: Jonathon Bellows, MD;  Location: [facility] ENDOSCOPY;  Service:               Gastroenterology;  Laterality: N/A; 2016: MASTECTOMY; Right     Comment:  BREAST CA No date: TUBAL LIGATION   Reproductive/Obstetrics                             Anesthesia Physical Anesthesia Plan  ASA: II  Anesthesia Plan: General   Post-op Pain Management:    Induction: Intravenous  PONV Risk Score and Plan: Ondansetron, Treatment may vary due to age or medical condition, Midazolam and Dexamethasone  Airway Management Planned: LMA  Additional Equipment:   Intra-op Plan:   Post-operative Plan: Extubation in OR  Informed Consent: I have reviewed the patients History and Physical, chart, labs and discussed the procedure including the risks, benefits and alternatives for the proposed anesthesia with the patient or authorized representative who has indicated his/her understanding  and acceptance.     Dental Advisory Given  Plan Discussed with: CRNA  Anesthesia Plan Comments: (LMA vs ETT as required by surgery)        Anesthesia Quick Evaluation

## 2020-05-29 NOTE — Transfer of Care (Signed)
Immediate Anesthesia Transfer of Care Note  Patient: Jill Walsh  Procedure(s) Performed: TOTAL MASTECTOMY (Left Breast)  Patient Location: PACU  Anesthesia Type:General  Level of Consciousness: sedated  Airway & Oxygen Therapy: Patient Spontanous Breathing and Patient connected to face mask oxygen  Post-op Assessment: Report given to RN and Post -op Vital signs reviewed and stable  Post vital signs: Reviewed and stable  Last Vitals:  Vitals Value Taken Time  BP 113/77 05/29/20 1331  Temp 36.3 C 05/29/20 1331  Pulse 76 05/29/20 1331  Resp 10 05/29/20 1331  SpO2 100 % 05/29/20 1331  Vitals shown include unvalidated device data.  Last Pain:  Vitals:   05/29/20 1331  TempSrc:   PainSc: 0-No pain         Complications: No complications documented.

## 2020-05-29 NOTE — Interval H&P Note (Signed)
History and Physical Interval Note:  05/29/2020 10:52 AM  Jill Walsh  has presented today for surgery, with the diagnosis of Breast pain N64.4.  The various methods of treatment have been discussed with the patient and family. After consideration of risks, benefits and other options for treatment, the patient has consented to  Procedure(s): TOTAL MASTECTOMY (Left) as a surgical intervention.  The patient's history has been reviewed, patient examined, no change in status, stable for surgery.  I have reviewed the patient's chart and labs.  Questions were answered to the patient's satisfaction.     Kylea Berrong Lysle Pearl

## 2020-05-29 NOTE — Op Note (Signed)
Preoperative diagnosis: Breast Pain  Postoperative diagnosis: same.   Procedure: Left  simple mastectomy  Anesthesia: GETA  Surgeon: Dr. Lysle Pearl  Wound Classification: Clean  Specimen: left Breast  Complications: None  Estimated Blood Loss: 25 mL   Indications: see H&P  Description of procedure: The patient was brought to the operating room and general anesthesia was induced. A time-out was completed verifying correct patient, procedure, site, positioning, and implant(s) and/or special equipment prior to beginning this procedure. The breast, chest wall, axilla, and upper arm and neck were prepped and draped in the usual sterile fashion.  A skin incision was made that encompassed the nipple-areola complex and passed in an oblique direction across the breast.    Skin flaps were raised in the avascular plane between subcutaneous tissue and breast tissue from the clavicle superiorly, the sternum medially, the anterior rectus sheath inferiorly, and past the lateral border of the pectoralis major muscle laterally. Hemostasis was achieved in the flaps. Next, the breast tissue and underlying pectoralis fascia were excised from the pectoralis major muscle, progressing from medially to laterally. At the lateral border of the pectoralis major muscle, the breast tissue was swung laterally and a lateral pedicle identified where breast tissue gave way to fat of axilla. The lateral pedicle was incised and the specimen removed.   Specimen marked long lateral, short superior and sent to pathology. The wound was irrigated and hemostasis was achieved. Closed suction drain  brought into the operative field through a separate stab incision and sutured to the skin with a 3-0 nylon suture. The wound was closed with interrupted 3-0 Vicryl to the subcutaneous layer, followed by a subcuticular layer of Monocryl 4-0. The wound was dressed.  The patient tolerated the procedure well and was taken to the postanesthesia  care unit in stable condition. Sponge and instrument count correct at end of procedure

## 2020-05-30 ENCOUNTER — Encounter: Payer: Self-pay | Admitting: Surgery

## 2020-05-30 DIAGNOSIS — N644 Mastodynia: Secondary | ICD-10-CM | POA: Diagnosis not present

## 2020-05-30 LAB — CBC
HCT: 37.5 % (ref 36.0–46.0)
Hemoglobin: 12.3 g/dL (ref 12.0–15.0)
MCH: 27.5 pg (ref 26.0–34.0)
MCHC: 32.8 g/dL (ref 30.0–36.0)
MCV: 83.9 fL (ref 80.0–100.0)
Platelets: 291 10*3/uL (ref 150–400)
RBC: 4.47 MIL/uL (ref 3.87–5.11)
RDW: 14.5 % (ref 11.5–15.5)
WBC: 20.3 10*3/uL — ABNORMAL HIGH (ref 4.0–10.5)
nRBC: 0 % (ref 0.0–0.2)

## 2020-05-30 LAB — BASIC METABOLIC PANEL
Anion gap: 10 (ref 5–15)
BUN: 11 mg/dL (ref 6–20)
CO2: 25 mmol/L (ref 22–32)
Calcium: 9.3 mg/dL (ref 8.9–10.3)
Chloride: 105 mmol/L (ref 98–111)
Creatinine, Ser: 0.79 mg/dL (ref 0.44–1.00)
GFR, Estimated: >60 mL/min (ref >60)
Glucose, Bld: 106 mg/dL — ABNORMAL HIGH (ref 70–99)
Potassium: 3.9 mmol/L (ref 3.5–5.1)
Sodium: 140 mmol/L (ref 135–145)

## 2020-05-30 LAB — SURGICAL PATHOLOGY

## 2020-05-30 MED ORDER — IBUPROFEN 200 MG PO TABS: 800.0000 mg | ORAL_TABLET | Freq: Three times a day (TID) | ORAL | 0 refills | Status: DC | PRN

## 2020-05-30 MED ORDER — DOCUSATE SODIUM 100 MG PO CAPS: 100.0000 mg | ORAL_CAPSULE | Freq: Two times a day (BID) | ORAL | 0 refills | Status: AC | PRN

## 2020-05-30 MED ORDER — HYDROCODONE-ACETAMINOPHEN 5-325 MG PO TABS: 1.0000 | ORAL_TABLET | Freq: Four times a day (QID) | ORAL | 0 refills | Status: DC | PRN

## 2020-05-30 MED ORDER — ACETAMINOPHEN 325 MG PO TABS: 650.0000 mg | ORAL_TABLET | Freq: Three times a day (TID) | ORAL | 0 refills | Status: AC | PRN

## 2020-05-30 NOTE — Progress Notes (Signed)
Pt discharged home with her husband.  VSS and pt without complaints.  All discharge instructions provided and questions answered.  Pt provided with instructions for wound and JP drain care.  Pt verbalizes understanding. Pt discharged via wheelchair.

## 2020-05-30 NOTE — Discharge Summary (Signed)
Physician Discharge Summary  Patient ID: Jill Walsh MRN: [id number] DOB/AGE: 09/10/1970 50 y.o.  Admit date: 05/29/2020 Discharge date: 05/30/20  Admission Diagnoses: breast pain  Discharge Diagnoses:  Same as above  Discharged Condition: good  Hospital Course: admitted for above.  Underwent simple mastectomy. See op note for details. Post op, recovered as expected with tolerating diet, pain controlled.  Consults: None  Discharge Exam: Blood pressure 125/78, pulse 81, temperature 98.4 F (36.9 C), temperature source Oral, resp. rate 18, height 5\' 5"  (1.651 m), weight 93.4 kg, last menstrual period 12/03/2019, SpO2 99 %. General appearance: alert, cooperative and no distress Skin: Skin color, texture, turgor normal. No rashes or lesions or erythema, induration, fluid collection around incision to indicate infection, bleeding.  JP with minimal sanguinous discharge  Disposition:  Discharge disposition: 01-Home or Self Care       Discharge Instructions    Discharge patient   Complete by: As directed    Discharge disposition: 01-Home or Self Care   Discharge patient date: 05/30/2020     Allergies as of 05/30/2020      Reactions   Tramadol Other (See Comments)   Real bad headache      Medication List    TAKE these medications   acetaminophen 325 MG tablet Commonly known as: Tylenol Take 2 tablets (650 mg total) by mouth every 8 (eight) hours as needed for mild pain.   albuterol 1.25 MG/3ML nebulizer solution Commonly known as: ACCUNEB Take 1 ampule by nebulization every 6 (six) hours as needed for wheezing.   diphenhydramine-acetaminophen 25-500 MG Tabs tablet Commonly known as: TYLENOL PM Take 1 tablet by mouth at bedtime.   docusate sodium 100 MG capsule Commonly known as: Colace Take 1 capsule (100 mg total) by mouth 2 (two) times daily as needed for up to 10 days for mild constipation.   HYDROcodone-acetaminophen 5-325 MG tablet Commonly known as:  Norco Take 1 tablet by mouth every 6 (six) hours as needed for up to 6 doses for moderate pain.   hydrOXYzine 25 MG tablet Commonly known as: ATARAX/VISTARIL Take 1 tablet (25 mg total) by mouth every 6 (six) hours as needed for itching.   ibuprofen 200 MG tablet Commonly known as: ADVIL Take 4 tablets (800 mg total) by mouth every 8 (eight) hours as needed for mild pain or moderate pain. What changed:   how much to take  when to take this       Coldwater, Evaro, DO. Go on 06/03/2020.   Specialty: Surgery Why: 10am appointment Contact information: [address] [place] [address] [phone]                Total time spent arranging discharge was >14min. Signed: Benjamine Sprague 05/30/2020, 11:47 AM

## 2020-05-30 NOTE — Discharge Instructions (Signed)
BREAST SURGERY, Care After This sheet gives you information about how to care for yourself after your procedure. Your doctor may also give you more specific instructions. If you have problems or questions, contact your doctor. Follow these instructions at home: Care for cuts from surgery (incisions)     Follow instructions from your doctor about how to take care of your cuts from surgery. Make sure you: ? Wash your hands with soap and water before you change your bandage (dressing). If you cannot use soap and water, use hand sanitizer. ? Change your bandage as told by your doctor. ? Leave stitches (sutures), skin glue, or skin tape (adhesive) strips in place. They may need to stay in place for 2 weeks or longer. If tape strips get loose and curl up, you may trim the loose edges. Do not remove tape strips completely unless your doctor says it is okay.  Do not take baths, swim, or use a hot tub until your doctor says it is okay. OK TO SHOWER IN 24HRS FROM DATE OF SURGERY.   Check your surgical cut area every day for signs of infection. Check for: ? More redness, swelling, or pain. ? More fluid or blood. ? Warmth. ? Pus or a bad smell. Activity  Do not drive or use heavy machinery while taking prescription pain medicine.  Do not play contact sports until your doctor says it is okay.  Do not drive for 24 hours if you were given a medicine to help you relax (sedative).  Rest as needed. Do not return to work or school until your doctor says it is okay.  Wear supportive bras as tolerated to minimize discomfort General instructions  Drain care and measure output as instructed by RN   tylenol and advil as needed for discomfort.  Please alternate between the two every four hours as needed for pain.     Use narcotics, if prescribed, only when tylenol and motrin is not enough to control pain.   325-650mg  every 8hrs to max of 4000mg /24hrs (including the 325mg  in every norco dose) for the  tylenol.     Advil up to 800mg  per dose every 8hrs as needed for pain.    To prevent or treat constipation while you are taking prescription pain medicine, your doctor may recommend that you: ? Drink enough fluid to keep your pee (urine) clear or pale yellow. ? Take over-the-counter or prescription medicines. ? Eat foods that are high in fiber, such as fresh fruits and vegetables, whole grains, and beans. ? Limit foods that are high in fat and processed sugars, such as fried and sweet foods. Contact a doctor if:  You develop a rash.  You have more redness, swelling, or pain around your surgical cuts.  You have more fluid or blood coming from your surgical cuts.  Your surgical cuts feel warm to the touch.  You have pus or a bad smell coming from your surgical cuts.  You have a fever.  One or more of your surgical cuts breaks open. Get help right away if:  You have trouble breathing.  You have chest pain.  You have pain that is getting worse in your shoulders.  You faint or feel dizzy when you stand.  You have very bad pain in your belly (abdomen).  You are sick to your stomach (nauseous) for more than one day.  You have throwing up (vomiting) that lasts for more than one day.  You have leg pain. This information is  not intended to replace advice given to you by your health care provider. Make sure you discuss any questions you have with your health care provider.

## 2020-06-12 NOTE — Anesthesia Postprocedure Evaluation (Signed)
Anesthesia Post Note  Patient: Jill Walsh  Procedure(s) Performed: TOTAL MASTECTOMY (Left Breast)  Patient location during evaluation: PACU Anesthesia Type: General Level of consciousness: awake and alert Pain management: pain level controlled Vital Signs Assessment: post-procedure vital signs reviewed and stable Respiratory status: spontaneous breathing, nonlabored ventilation and respiratory function stable Cardiovascular status: blood pressure returned to baseline and stable Postop Assessment: no apparent nausea or vomiting Anesthetic complications: no   No complications documented.   Last Vitals:  Vitals:   05/30/20 0428 05/30/20 0801  BP: 111/71 125/78  Pulse: 67 81  Resp: 16 18  Temp: 36.7 C 36.9 C  SpO2: 95% 99%    Last Pain:  Vitals:   05/30/20 0801  TempSrc: Oral  PainSc: 0-No pain                 Alphonsus Sias

## 2020-08-22 ENCOUNTER — Encounter: Payer: Self-pay | Admitting: Nurse Practitioner

## 2020-08-22 ENCOUNTER — Other Ambulatory Visit: Payer: Self-pay

## 2020-08-22 ENCOUNTER — Inpatient Hospital Stay: Payer: 59 | Attending: Nurse Practitioner | Admitting: Nurse Practitioner

## 2020-08-22 VITALS — BP 141/91 | HR 102 | Temp 97.3°F | Resp 20 | Wt 215.8 lb

## 2020-08-22 DIAGNOSIS — Z803 Family history of malignant neoplasm of breast: Secondary | ICD-10-CM | POA: Diagnosis not present

## 2020-08-22 DIAGNOSIS — N644 Mastodynia: Secondary | ICD-10-CM | POA: Diagnosis not present

## 2020-08-22 DIAGNOSIS — Z9013 Acquired absence of bilateral breasts and nipples: Secondary | ICD-10-CM | POA: Insufficient documentation

## 2020-08-22 DIAGNOSIS — Z87891 Personal history of nicotine dependence: Secondary | ICD-10-CM | POA: Diagnosis not present

## 2020-08-22 DIAGNOSIS — Z853 Personal history of malignant neoplasm of breast: Secondary | ICD-10-CM | POA: Diagnosis present

## 2020-08-22 DIAGNOSIS — Z9012 Acquired absence of left breast and nipple: Secondary | ICD-10-CM | POA: Diagnosis not present

## 2020-08-22 DIAGNOSIS — Z800 Family history of malignant neoplasm of digestive organs: Secondary | ICD-10-CM | POA: Insufficient documentation

## 2020-08-22 NOTE — Progress Notes (Signed)
[facility]  [address] [place],  [address] Phone: [phone]  Fax: [phone]   Clinic Day:  08/22/2020  Referring physician: Juline Patch, MD  Chief Complaint: Jill Walsh is a 50 y.o. 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   Oncology History: Jill Walsh is a 51 y.o. 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  No lymph nodes were removed.  Tumor was ER/PR negative and Her2/neu 3+.  Pathologic stage was [id number].     Left mammogram and ultrasound on 12/06/2014 revealed no mammographic or sonographic evidence of malignancy in the left breast.  There was an incidental note of two less than 4 mm benign appearing cysts at the 9:30 o'clock 9 cm and 10 cm from the nipple.  Left screening mammogram on 12/09/2016 revealed no mammographic evidence of malignancy.  Left screening mammogram on 12/12/2017 revealed no evidence of malignancy.  Left screening mammogram on 12/14/2018 revealed no evidence of malignancy.  Left screening mammogram on 12/18/2019 revealed no evidence of malignancy.   CA27.29 has been followed (0-38.6): 24.4 on 04/30/2014, 18.0 on 11/25/2014, 26.8 on 03/14/2015, 20.6 on 07/14/2015, 19.1 on 12/11/2015, 21.6 on 04/15/2016, 18.6 on 10/18/2016, 17.6 on 03/29/2017, 16.8 on 09/26/2017, 13.2 on 04/05/2018, 13.1 on 10/23/2018, 19.5 on 04/19/2019, 26.8 on 10/22/2019, and 18.8 on 05/06/2020.   MyRisk genetic testing revealed no clinically significant mutation.  She has a mother who developed breast cancer at age 51  A maternal aunt has colon cancer.  Colonoscopy on 11/13/2018 was normal.  She received the [place] vaccine on 05/25/2019 and 06/19/2019.   Interval History: Due to chronic left breast pain she underwent total left breast mastectomy on 05/29/20 with Dr. SLysle Pearl Pathology was  negative for atypia and/or malignancy. She continues to recover from surgery but otherwise feels well. She denies recent fevers or chills. No interval infections. Denies any neurologic complaints. Denies any easy bleeding or bruising. No melena or hematochezia. Reports good appetite and denies weight loss. Denies chest pain or shortness of breath. Denies any nausea, vomiting, constipation, or diarrhea. Denies urinary complaints. Patient offers no further specific complaints today.   Past Medical History:  Diagnosis Date   Asthma    Breast cancer ([place]   RT MASTECTOMY    Past Surgical History:  Procedure Laterality Date   BREAST BIOPSY Left    02/28/14 negative   BREAST BIOPSY Right     02/2014 malignant   COLONOSCOPY WITH PROPOFOL N/A 11/13/2018   Procedure: COLONOSCOPY WITH PROPOFOL;  Surgeon: AJonathon Bellows MD;  [facility];  Service: Gastroenterology;  Laterality: N/A;   MASTECTOMY Right 2016   BREAST CA   TOTAL MASTECTOMY Left 05/29/2020   Procedure: TOTAL MASTECTOMY;  Surgeon: SBenjamine Sprague DO;  Location: [facility] ORS;  Service: General;  Laterality: Left;   TUBAL LIGATION      Family History  Problem Relation Age of Onset   Cancer Mother    Breast cancer Mother 632  Stroke Brother    Cancer Maternal Aunt     Social History:  reports that she quit smoking about 6 years ago. Her smoking use included cigarettes and e-cigarettes. She has quit using smokeless tobacco. She reports that she does not drink alcohol and does not use drugs. She was a mFreight forwarderat 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 She  got married on 04/19/2020. The patient is alone today.  Allergies:  Allergies  Allergen Reactions   Tramadol Other (See Comments)    Real bad headache    Current Medications: Current Outpatient Medications  Medication Sig Dispense Refill   albuterol (ACCUNEB) 1.25 MG/3ML nebulizer solution Take 1 ampule by nebulization  every 6 (six) hours as needed for wheezing.     diphenhydramine-acetaminophen (TYLENOL PM) 25-500 MG TABS tablet Take 1 tablet by mouth at bedtime.     HYDROcodone-acetaminophen (NORCO) 5-325 MG tablet Take 1 tablet by mouth every 6 (six) hours as needed for up to 6 doses for moderate pain. 6 tablet 0   hydrOXYzine (ATARAX/VISTARIL) 25 MG tablet Take 1 tablet (25 mg total) by mouth every 6 (six) hours as needed for itching. 20 tablet 0   ibuprofen (ADVIL) 200 MG tablet Take 4 tablets (800 mg total) by mouth every 8 (eight) hours as needed for mild pain or moderate pain. 30 tablet 0   No current facility-administered medications for this visit.    Review of Systems  Constitutional:  Negative for chills, fever, malaise/fatigue and weight loss.  HENT:  Negative for hearing loss, nosebleeds, sore throat and tinnitus.   Eyes:  Negative for blurred vision and double vision.  Respiratory:  Negative for cough, hemoptysis, shortness of breath and wheezing.   Cardiovascular:  Negative for chest pain, palpitations and leg swelling.  Gastrointestinal:  Negative for abdominal pain, blood in stool, constipation, diarrhea, melena, nausea and vomiting.  Genitourinary:  Negative for dysuria and urgency.  Musculoskeletal:  Negative for back pain, falls, joint pain and myalgias.       Limited range of motion on left & pain post mastectomy  Skin:  Negative for itching and rash.       Hidradenitis suppurtiva.  Neurological:  Negative for dizziness, tingling, sensory change, loss of consciousness, weakness and headaches.  Endo/Heme/Allergies:  Negative for environmental allergies. Does not bruise/bleed easily.  Psychiatric/Behavioral:  Positive for depression. The patient is not nervous/anxious and does not have insomnia.    Performance status (ECOG): 0  Vital Signs Blood pressure (!) 141/91, pulse (!) 102, temperature (!) 97.3 F (36.3 C), temperature source Tympanic, resp. rate 20, weight 215 lb 13.3 oz  (97.9 kg), last menstrual period 12/03/2019.  General: Well-developed, well-nourished, no acute distress. Eyes: Pink conjunctiva, anicteric sclera. Lungs: Clear to auscultation bilaterally.  No audible wheezing or coughing Heart: Regular rate and rhythm.  Chest wall- left mastectomy healing well. Tender to palpation in left axilla.  Abdomen: Soft, nontender, nondistended.  Musculoskeletal: No edema, cyanosis, or clubbing. Unable to lift left shoulder more than 90 degrees due to discomfort from axilla Neuro: Alert, answering all questions appropriately. Cranial nerves grossly intact. Skin: No rashes or petechiae noted. Psych: tearful   No visits with results within 3 Day(s) from this visit.  Latest known visit with results is:  Admission on 05/29/2020, Discharged on 05/30/2020  Component Date Value Ref Range Status   SURGICAL PATHOLOGY 05/29/2020    Final-Edited                   Value:SURGICAL PATHOLOGY CASE: [id number] PATIENT: Izetta Dakin Surgical Pathology Report     Specimen Submitted: A. Breast, left  Clinical History: Breast pain N64.4      DIAGNOSIS: A. LEFT BREAST; SIMPLE MASTECTOMY: - BENIGN BREAST TISSUE AND SKIN. - NEGATIVE FOR ATYPIA AND MALIGNANCY.   GROSS DESCRIPTION: A. Labeled: Left breast (mastectomy): Short-superior, long-lateral Received: Formalin Time in  fixative: Collected at 12:18 PM on 05/29/2020 and placed in formalin at 12:24 PM on 05/29/2020 Cold ischemic time: Less than 10 minutes Total fixation time: 7.25 hours Type of mastectomy: Total mastectomy (simple) Laterality: Left Weight of specimen: 2199 grams Size of specimen: 28 (medial to lateral) x 23.6 (superior to inferior) x 7.7 (anterior to posterior) cm Orientation of specimen: The specimen is received oriented with a short stitch indicating superior and a long stitch indicating lateral. Inking scheme: Anterior = blue, posterior = black, lateral                          =  orange. Skin ellipse dimensions  description: The skin ellipse is 30.2 x 21.1 cm.  The skin is brown-tan, smooth, and grossly unremarkable. Nipple/ areola: The nipple is 2.1 cm in diameter and the areola is 7.1 cm in diameter.  Both are grossly unremarkable. Axillary tail: A distinct portion of axillary tail is not grossly appreciated. Biopsy site(s): None grossly appreciated. Number of discrete masses: None grossly appreciated. Description of breast: Sectioning reveals yellow lobulated adipose tissue admixed with tan-white firm fibrous tissue.  No distinct masses or lesions are grossly identified.  The fat to fibrous tissue ratio is 85:15. Lymph nodes: The upper outer quadrant is palpated and no distinct masses, lesions, or lymph node candidates are identified.  Block Summary: 1 - representative nipple/areola, perpendicularly sectioned 2 - 3 - representative upper outer quadrant 4 - 5 - representative lower outer quadrant 6 - 7 - representative upper inner quadra                         nt 8 - 9 - representative lower inner quadrant  RB 05/29/2020  Final Diagnosis performed by Betsy Pries, MD.   Electronically signed 05/30/2020 10:10:01AM The electronic signature indicates that the named Attending Pathologist has evaluated the specimen Technical component performed at [facility], [address] [place], [place] [address] Lab: [phone] Dir: Rush Farmer, MD, MMM  Professional component performed at [facility], [facility], [place], [place], [place] [address] Lab: [phone] Dir: Dellia Nims. Rubinas, MD    WBC 05/29/2020 17.9 (A) 4.0 - 10.5 K/uL Final   RBC 05/29/2020 4.39  3.87 - 5.11 MIL/uL Final   Hemoglobin 05/29/2020 12.2  12.0 - 15.0 g/dL Final   HCT 05/29/2020 37.2  36.0 - 46.0 % Final   MCV 05/29/2020 84.7  80.0 - 100.0 fL Final   MCH 05/29/2020 27.8  26.0 - 34.0 pg Final   MCHC 05/29/2020 32.8  30.0 - 36.0 g/dL Final   RDW 05/29/2020 14.2   11.5 - 15.5 % Final   Platelets 05/29/2020 269  150 - 400 K/uL Final   nRBC 05/29/2020 0.0  0.0 - 0.2 % Final   Performed at [facility], [address] [place], [place] [address]   Creatinine, Ser 05/29/2020 0.98  0.44 - 1.00 mg/dL Final   GFR, Estimated 05/29/2020 >60  >60 mL/min Final   Comment: (NOTE) Calculated using the CKD-EPI Creatinine Equation (2021) Performed at [facility], [address] [place], [place] [address]    WBC 05/30/2020 20.3 (A) 4.0 - 10.5 K/uL Final   RBC 05/30/2020 4.47  3.87 - 5.11 MIL/uL Final   Hemoglobin 05/30/2020 12.3  12.0 - 15.0 g/dL Final   HCT 05/30/2020 37.5  36.0 - 46.0 % Final   MCV 05/30/2020 83.9  80.0 - 100.0 fL  Final   MCH 05/30/2020 27.5  26.0 - 34.0 pg Final   MCHC 05/30/2020 32.8  30.0 - 36.0 g/dL Final   RDW 05/30/2020 14.5  11.5 - 15.5 % Final   Platelets 05/30/2020 291  150 - 400 K/uL Final   nRBC 05/30/2020 0.0  0.0 - 0.2 % Final   Performed at [facility], [place], [place] [address]   Sodium 05/30/2020 140  135 - 145 mmol/L Final   Potassium 05/30/2020 3.9  3.5 - 5.1 mmol/L Final   Chloride 05/30/2020 105  98 - 111 mmol/L Final   CO2 05/30/2020 25  22 - 32 mmol/L Final   Glucose, Bld 05/30/2020 106 (A) 70 - 99 mg/dL Final   Glucose reference range applies only to samples taken after fasting for at least 8 hours.   BUN 05/30/2020 11  6 - 20 mg/dL Final   Creatinine, Ser 05/30/2020 0.79  0.44 - 1.00 mg/dL Final   Calcium 05/30/2020 9.3  8.9 - 10.3 mg/dL Final   GFR, Estimated 05/30/2020 >60  >60 mL/min Final   Comment: (NOTE) Calculated using the CKD-EPI Creatinine Equation (2021)    Anion gap 05/30/2020 10  5 - 15 Final   Performed at [facility], [address] [place],  [address]    Assessment:    1.   Her2neu+ multi-focal microinvasive right breast cancer:- s/pright mastectomy 03/28/14, ER/PR negative, HER2/neu positive. Negative genetic testing. She is  now > 6 years from her diagnosis. NED since diagnosis. NED today. Recommend transitioning to yearly surveillance based on NCCN guidelines.   2. Left breast pain- s/p total mastectomy. No indication for further mammograms. If patient has suspicious symptoms for recurrence, would recommend MRI. She continues to have limited range of motion in the left arm and is unable to lift objects which is a requirement of her job. Will refer her to physical therapy for evaluation and management. Also encouraged use of celecoxib which was prescribed by surgery and gabapentin. She has not taken either. Risks vs benefits reviewed today. I encouraged her to discuss her concerns with HR at her job to see what options she might have.   Plan: RTC in 1 year to establish care with MD or sooner if concerning symptoms.   I discussed the assessment and treatment plan with the patient.  The patient was provided an opportunity to ask questions and all were answered.  The patient agreed with the plan and demonstrated an understanding of the instructions.  The patient was advised to call back or seek an in person evaluation if the symptoms worsen or if the condition fails to improve as anticipated.  Beckey Rutter, DNP, AGNP-C

## 2020-09-08 ENCOUNTER — Other Ambulatory Visit: Payer: Self-pay

## 2020-09-08 ENCOUNTER — Ambulatory Visit: Payer: 59 | Attending: Nurse Practitioner | Admitting: Physical Therapy

## 2020-09-08 DIAGNOSIS — M25612 Stiffness of left shoulder, not elsewhere classified: Secondary | ICD-10-CM | POA: Diagnosis present

## 2020-09-08 DIAGNOSIS — R293 Abnormal posture: Secondary | ICD-10-CM | POA: Diagnosis present

## 2020-09-08 DIAGNOSIS — M79602 Pain in left arm: Secondary | ICD-10-CM | POA: Insufficient documentation

## 2020-09-08 NOTE — Therapy (Signed)
[facility] [address] [place], [place], [address] Phone: [phone]   Fax:  [phone]  Physical Therapy Evaluation  Patient Details  Name: Jill Walsh MRN: [id number] Date of Birth: 08-13-1970 Referring Provider (PT): Verlon Au   Encounter Date: 09/08/2020   PT End of Session - 09/08/20 1459     Visit Number 1    Number of Visits 13    Date for PT Re-Evaluation 10/20/20    Authorization Type IE 09/08/2020    PT Start Time 1500    PT Stop Time 1540    PT Time Calculation (min) 40 min    Activity Tolerance Patient tolerated treatment well;Patient limited by pain    Behavior During Therapy [facility] for tasks assessed/performed             Past Medical History:  Diagnosis Date   Asthma    Breast cancer ([place]) 2016   RT MASTECTOMY    Past Surgical History:  Procedure Laterality Date   BREAST BIOPSY Left    02/28/14 negative   BREAST BIOPSY Right     02/2014 malignant   COLONOSCOPY WITH PROPOFOL N/A 11/13/2018   Procedure: COLONOSCOPY WITH PROPOFOL;  Surgeon: Jonathon Bellows, MD;  Location: [facility] ENDOSCOPY;  Service: Gastroenterology;  Laterality: N/A;   MASTECTOMY Right 2016   BREAST CA   TOTAL MASTECTOMY Left 05/29/2020   Procedure: TOTAL MASTECTOMY;  Surgeon: Benjamine Sprague, DO;  Location: [facility] ORS;  Service: General;  Laterality: Left;   TUBAL LIGATION      There were no vitals filed for this visit.    Subjective Assessment - 09/08/20 1506     Subjective Patient notes that since masectomy (05/29/2020) she has had increased tightness and restriction with movement on L side. Patient notes that pain and inability to reach or functionally move arm are limiting her ability to participate in neccessary work functions. Patient also reports difficulty with sleeping because of restriction and chest tightness.    Limitations Lifting;House hold activities    Currently in Pain? Yes    Pain Score 5     Pain Location Axilla     Pain Orientation Left    Pain Descriptors / Indicators Tightness    Pain Radiating Towards midline along scar    Pain Onset More than a month ago    Pain Frequency Constant                [facility] PT Assessment - 09/08/20 0001       Assessment   Medical Diagnosis s/p left masectomy    Referring Provider (PT) Verlon Au    Prior Therapy none for this dx      Balance Screen   Has the patient fallen in the past 6 months No             SCREENING Red Flags: None Have you had any increased drowsiness? Unexplained nausea and vomiting? Changes to vision, dizziness, speech? Any trouble swallowing food/beverage? Unexplained changes in bowel or bladder habits?  Mental Status Patient is oriented to person, place and time.  Recent memory is intact.  Remote memory is intact.  Attention span and concentration are intact.  Expressive speech is intact.  Patient's fund of knowledge is within normal limits for educational level.  POSTURE/OBSERVATIONS:  Cervical lordosis: WNL Thoracic kyphosis: increased with R rotational component Shoulder height: L shoulder depressed and anterior  Torticollis: negative  RANGE OF MOTION:    LEFT RIGHT  Cervical forward flexion:  WNL    Cervical extension: WNL    Cervical lateral flexion:  WNL WNL*  Cervical rotation:    WNL WNL  Shoulder Flexion:   110 160  Shoulder IR:  L2 T4  Shoulder ER:  deferred WNL  Shoulder Abduction:  100 160    SENSATION: Grossly intact to light touch bilateral LEs as determined by testing dermatomes C2-T1. Proprioception and hot/cold testing deferred on this date  STRENGTH: MMT deferred 2/2 to pain and irritability  RUE LUE  Shoulder Elevation    Shoulder Flexion    Shoulder  Extension    Shoulder Abduction     Shoulder Adduction     Shoulder ER     Shoulder IR     Elbow Extension    Elbow Flexion    Wrist Flexion     Wrist Extension     Thumb Extension    Lumbricals       PALPATION: Patient TTP over [place] capsule and superior to scar site. Particularly tender in the region of pec major clavicular head, as well as pec minor.      ASSESSMENT Patient is a 50 year old presenting to clinic with chief complaints of L axillary and chest pain s/p masectomy (April 2022). Upon examination, patient demonstrates deficits in scar mobility, LUE ROM, LUE function, posture, and pain as evidenced by L shoulder flexion to 110 degrees (AROM), L shoulder abduction to 100 degrees (AROM), increased thoracic kyphosis with R rotation and depression of L shoulder girdle, tenderness to palpation at the [facility] capsule, clavicular head of pec major and pec minor, and daily medicated pain 5/10. Patient's responses on FOTO outcome measures (Risk Adjusted Statistical score 51) indicate significant functional limitations/disability/distress. Patient's progress may be limited due to tissue restrictions at surgical site; however, patient's motivation is advantageous. Patient was able to tolerate gentle manual interventions at the L chest and shoulder during today's evaluation and responded positively to scar mobilization interventions. Patient will benefit from continued skilled therapeutic intervention to address deficits in scar mobility, LUE ROM, LUE function, posture, and pain in order to increase function and improve overall QOL.  EDUCATION Patient educated on prognosis, POC, and provided with HEP including: gentle doorway pec stretch. Patient articulated understanding and returned demonstration. Patient will benefit from further education in order to maximize compliance and understanding for long-term therapeutic gains.    Objective measurements completed on examination: See above findings.         PT Long Term Goals - 09/09/20 0836       PT LONG TERM GOAL #1   Title Patient will be independent with HEP in order to decrease L arm/chest pain and increase function in order to improve  pain-free participation at home and in the community.    Baseline IE: not initiated    Time 6    Period Weeks    Status New    Target Date 10/20/20      PT LONG TERM GOAL #2   Title Patient will decrease daily L arm pain as reported on NPRS by at least 2 points in order to demonstrate clinically significant reduction in L arm pain.    Baseline IE: 5/10 (with medication)    Time 6    Period Weeks    Status New    Target Date 10/20/20      PT LONG TERM GOAL #3   Title Patient will demonstrate improved L shoulder flexion and abduction to within 10 degrees  of RUE for improved participation at home and in the community.    Baseline IE:    Time 6    Period Weeks    Status New    Target Date 10/20/20      PT LONG TERM GOAL #4   Title Patient will report "no difficulty" with performing overhead tasks and reach shelves at or above shoulder height for improved participation at home and in the community.    Baseline IE: "little difficulty"    Time 6    Period Weeks    Status New    Target Date 10/20/20                 Plan - 09/08/20 1500     Clinical Impression Statement Patient is a 50 year old presenting to clinic with chief complaints of L axillary and chest pain s/p masectomy (April 2022). Upon examination, patient demonstrates deficits in scar mobility, LUE ROM, LUE function, posture, and pain as evidenced by L shoulder flexion to 110 degrees (AROM), L shoulder abduction to 100 degrees (AROM), increased thoracic kyphosis with R rotation and depression of L shoulder girdle, tenderness to palpation at the [facility] capsule, clavicular head of pec major and pec minor, and daily medicated pain 5/10. Patient's responses on FOTO outcome measures (Risk Adjusted Statistical score 51) indicate significant functional limitations/disability/distress. Patient's progress may be limited due to tissue restrictions at surgical site; however, patient's motivation is advantageous. Patient was able to  tolerate gentle manual interventions at the L chest and shoulder during today's evaluation and responded positively to scar mobilization interventions. Patient will benefit from continued skilled therapeutic intervention to address deficits in scar mobility, LUE ROM, LUE function, posture, and pain in order to increase function and improve overall QOL.    Personal Factors and Comorbidities Past/Current Experience;Time since onset of injury/illness/exacerbation;Comorbidity 3+    Comorbidities asthma, hx of breast cancer, mastodynia, chronic bronchitis    Examination-Activity Limitations Bathing;Reach Overhead;Lift    Examination-Participation Restrictions Occupation;Driving;Laundry;Shop;Yard Work    [employer]chant [employer] Evolving/Moderate complexity    Clinical Decision Making Moderate    Rehab Potential Good    PT Frequency 2x / week    PT Duration 6 weeks    PT Treatment/Interventions ADLs/Self Care Home Management;Cryotherapy;Moist Heat;Therapeutic exercise;Neuromuscular re-education;Patient/family education;Manual techniques;Scar mobilization;Taping;Joint Manipulations;Dry needling;Passive range of motion    PT Next Visit Plan manual; low load long duration stretches    PT Home Exercise Plan continue scar massage; low angle doorway stretch    Consulted and Agree with Plan of Care Patient             Patient will benefit from skilled therapeutic intervention in order to improve the following deficits and impairments:  Decreased range of motion, Increased fascial restricitons, Hypomobility, Impaired UE functional use, Pain, Postural dysfunction, Decreased scar mobility  Visit Diagnosis: Decreased range of motion of left shoulder  Pain in left arm  Abnormal posture     Problem List Patient Active Problem List   Diagnosis Date Noted   Family history of colon cancer 04/23/2019   Simple chronic bronchitis ([place]) 05/30/2017   Breast pain 12/20/2014   Family history of  breast cancer 05/28/2014   Current smoker 05/28/2014   Breast cancer ([place]) 05/28/2014   Breast cancer, right breast ([place]) 03/28/2014    Myles Gip PT, DPT [id number]  09/08/2020, 3:15 PM  [facility] [address] [place], [place], [address] Phone: [phone]   Fax:  [phone]  Name: Jill Walsh MRN: [id number] Date of Birth: Oct 09, 1970

## 2020-09-10 ENCOUNTER — Encounter: Payer: Self-pay | Admitting: Physical Therapy

## 2020-09-10 ENCOUNTER — Other Ambulatory Visit: Payer: Self-pay

## 2020-09-10 ENCOUNTER — Ambulatory Visit: Payer: 59 | Admitting: Physical Therapy

## 2020-09-10 DIAGNOSIS — M25612 Stiffness of left shoulder, not elsewhere classified: Secondary | ICD-10-CM

## 2020-09-10 DIAGNOSIS — R293 Abnormal posture: Secondary | ICD-10-CM

## 2020-09-10 DIAGNOSIS — M79602 Pain in left arm: Secondary | ICD-10-CM

## 2020-09-10 NOTE — Therapy (Signed)
[facility] [address] [place], [place], [address] Phone: [phone]   Fax:  [phone]  Physical Therapy Treatment  Patient Details  Name: Jill Walsh MRN: [id number] Date of Birth: 14-Sep-1970 Referring Provider (PT): Verlon Au   Encounter Date: 09/10/2020   PT End of Session - 09/10/20 1837     Visit Number 2    Number of Visits 20    Date for PT Re-Evaluation 10/20/20    Authorization Type IE 09/08/2020    PT Start Time 1330    PT Stop Time 1415    PT Time Calculation (min) 45 min    Activity Tolerance Patient tolerated treatment well;Patient limited by pain    Behavior During Therapy [facility] for tasks assessed/performed             Past Medical History:  Diagnosis Date   Asthma    Breast cancer ([place]) 2016   RT MASTECTOMY    Past Surgical History:  Procedure Laterality Date   BREAST BIOPSY Left    02/28/14 negative   BREAST BIOPSY Right     02/2014 malignant   COLONOSCOPY WITH PROPOFOL N/A 11/13/2018   Procedure: COLONOSCOPY WITH PROPOFOL;  Surgeon: Jonathon Bellows, MD;  Location: [facility] ENDOSCOPY;  Service: Gastroenterology;  Laterality: N/A;   MASTECTOMY Right 2016   BREAST CA   TOTAL MASTECTOMY Left 05/29/2020   Procedure: TOTAL MASTECTOMY;  Surgeon: Benjamine Sprague, DO;  Location: [facility] ORS;  Service: General;  Laterality: Left;   TUBAL LIGATION      There were no vitals filed for this visit.   Subjective Assessment - 09/10/20 1835     Subjective Patient reports that she had 9/10 pain after manual interventions on evaluation earlier this week. She notes she is in 7/10 pain today. Patient became tearful discussing her pain experience and her inability to use her dominant UE since surgery. Patient also reporting increased heart rate with ADLs including bed making.    Limitations Lifting;House hold activities    Pain Onset More than a month ago            TREATMENT  Neuromuscular Re-education: Supine  thoracic extension sustained long duration hold for improved posture and decreased pain/spasm Supine AAROM L shoulder ER for graded exposure to movement and improved length in tissues Supine elbow flexion and extension AAROM for grade exposure to movement and improved length in tissues Patient education on HEP including low load long duration hold stretches: thoracic extension, L shoulder ER.    Patient educated throughout session on appropriate technique and form using multi-modal cueing, HEP, and activity modification. Patient articulated understanding and returned demonstration.  Patient Response to interventions: 7/10 pain  ASSESSMENT Patient presents to clinic with excellent motivation to participate in therapy. Patient demonstrates deficits in scar mobility, LUE ROM, LUE function, posture, and pain. Patient able to achieve AAROM L shoulder ER at 15 degrees abduction to roughly 30 degrees during today's session and responded positively to graded exposure to movement interventions, which will likely continue to be of benefit as patient notes increased fear of movement 2/2 to pain intensity. Patient will benefit from continued skilled therapeutic intervention to address remaining deficits in scar mobility, LUE ROM, LUE function, posture, and pain in order to increase function and improve overall QOL.           PT Long Term Goals - 09/09/20 [id number]       PT LONG TERM GOAL #1   Title Patient  will be independent with HEP in order to decrease L arm/chest pain and increase function in order to improve pain-free participation at home and in the community.    Baseline IE: not initiated    Time 6    Period Weeks    Status New    Target Date 10/20/20      PT LONG TERM GOAL #2   Title Patient will decrease daily L arm pain as reported on NPRS by at least 2 points in order to demonstrate clinically significant reduction in L arm pain.    Baseline IE: 5/10 (with medication)    Time 6     Period Weeks    Status New    Target Date 10/20/20      PT LONG TERM GOAL #3   Title Patient will demonstrate improved L shoulder flexion and abduction to within 10 degrees of RUE for improved participation at home and in the community.    Baseline IE:    Time 6    Period Weeks    Status New    Target Date 10/20/20      PT LONG TERM GOAL #4   Title Patient will report "no difficulty" with performing overhead tasks and reach shelves at or above shoulder height for improved participation at home and in the community.    Baseline IE: "little difficulty"    Time 6    Period Weeks    Status New    Target Date 10/20/20                   Plan - 09/10/20 1837     Clinical Impression Statement Patient presents to clinic with excellent motivation to participate in therapy. Patient demonstrates deficits in scar mobility, LUE ROM, LUE function, posture, and pain. Patient able to achieve AAROM L shoulder ER at 15 degrees abduction to roughly 30 degrees during today's session and responded positively to graded exposure to movement interventions, which will likely continue to be of benefit as patient notes increased fear of movement 2/2 to pain intensity. Patient will benefit from continued skilled therapeutic intervention to address remaining deficits in scar mobility, LUE ROM, LUE function, posture, and pain in order to increase function and improve overall QOL.    Personal Factors and Comorbidities Past/Current Experience;Time since onset of injury/illness/exacerbation;Comorbidity 3+    Comorbidities asthma, hx of breast cancer, mastodynia, chronic bronchitis    Examination-Activity Limitations Bathing;Reach Overhead;Lift    Examination-Participation Restrictions Occupation;Driving;Laundry;Shop;Yard Work    [employer] Evolving/Moderate complexity    Rehab Potential Good    PT Frequency 3x / week    PT Duration 6 weeks    PT Treatment/Interventions ADLs/Self Care  Home Management;Cryotherapy;Moist Heat;Therapeutic exercise;Neuromuscular re-education;Patient/family education;Manual techniques;Scar mobilization;Taping;Joint Manipulations;Dry needling;Passive range of motion    PT Next Visit Plan manual; low load long duration stretches    PT Home Exercise Plan continue scar massage; low angle doorway stretch    Consulted and Agree with Plan of Care Patient             Patient will benefit from skilled therapeutic intervention in order to improve the following deficits and impairments:  Decreased range of motion, Increased fascial restricitons, Hypomobility, Impaired UE functional use, Pain, Postural dysfunction, Decreased scar mobility  Visit Diagnosis: Decreased range of motion of left shoulder  Pain in left arm  Abnormal posture     Problem List Patient Active Problem List   Diagnosis Date Noted   Family history of colon  cancer 04/23/2019   Simple chronic bronchitis ([place]) 05/30/2017   Breast pain 12/20/2014   Family history of breast cancer 05/28/2014   Current smoker 05/28/2014   Breast cancer ([place]) 05/28/2014   Breast cancer, right breast ([place]) 03/28/2014    Myles Gip PT, DPT [phone]  09/10/2020, 6:44 PM  [facility] [address] [place], [place], [address] Phone: [phone]   Fax:  [phone]  Name: Contina Ruess MRN: [id number] Date of Birth: Dec 07, 1970

## 2020-09-12 ENCOUNTER — Ambulatory Visit: Payer: 59 | Admitting: Physical Therapy

## 2020-09-15 ENCOUNTER — Other Ambulatory Visit: Payer: Self-pay

## 2020-09-15 ENCOUNTER — Ambulatory Visit: Payer: 59 | Admitting: Physical Therapy

## 2020-09-15 ENCOUNTER — Encounter: Payer: Self-pay | Admitting: Physical Therapy

## 2020-09-15 VITALS — BP 135/91 | HR 96

## 2020-09-15 DIAGNOSIS — M25612 Stiffness of left shoulder, not elsewhere classified: Secondary | ICD-10-CM

## 2020-09-15 DIAGNOSIS — R293 Abnormal posture: Secondary | ICD-10-CM

## 2020-09-15 DIAGNOSIS — M79602 Pain in left arm: Secondary | ICD-10-CM

## 2020-09-15 NOTE — Therapy (Signed)
[facility] [address] [place], [place], [address] Phone: [phone]   Fax:  [phone]  Physical Therapy Treatment  Patient Details  Name: Jill Walsh MRN: [id number] Date of Birth: 10-Mar-1970 Referring Provider (PT): Verlon Au   Encounter Date: 09/15/2020   PT End of Session - 09/15/20 1329     Visit Number 3    Number of Visits 20    Date for PT Re-Evaluation 10/20/20    Authorization Type IE 09/08/2020    PT Start Time 1330    PT Stop Time 1410    PT Time Calculation (min) 40 min    Activity Tolerance Patient tolerated treatment well;Patient limited by pain    Behavior During Therapy [facility] for tasks assessed/performed             Past Medical History:  Diagnosis Date   Asthma    Breast cancer ([place]) 2016   RT MASTECTOMY    Past Surgical History:  Procedure Laterality Date   BREAST BIOPSY Left    02/28/14 negative   BREAST BIOPSY Right     02/2014 malignant   COLONOSCOPY WITH PROPOFOL N/A 11/13/2018   Procedure: COLONOSCOPY WITH PROPOFOL;  Surgeon: Jonathon Bellows, MD;  Location: [facility] ENDOSCOPY;  Service: Gastroenterology;  Laterality: N/A;   MASTECTOMY Right 2016   BREAST CA   TOTAL MASTECTOMY Left 05/29/2020   Procedure: TOTAL MASTECTOMY;  Surgeon: Benjamine Sprague, DO;  Location: [facility] ORS;  Service: General;  Laterality: Left;   TUBAL LIGATION      Vitals:   09/15/20 1340  BP: (!) 135/91  Pulse: 96     Subjective Assessment - 09/15/20 1342     Subjective Patient notes that she just received notice that her job has let her go. Patient notes she has continued to work on scar massage despite high level o fpain. Patient is not interested in Botox injections for fear that it will not solve the problem and is interested in having the surgical site corrected.    Limitations Lifting;House hold activities    Currently in Pain? Yes    Pain Score 4     Pain Location [place]    Pain Onset More than a month ago              TREATMENT  Therapeutic Exercise: Supine thoracic extension sustained long duration hold for improved posture and decreased pain/spasm Supine AAROM L shoulder ER for improved function; progressive overload from 15-70 degrees of abduction, 0-20 degrees of ER Supine PNF contract-relax for ER at L shoulder in 60 degrees of abduction Unbilled: MHP during supine stretches.   Patient educated throughout session on appropriate technique and form using multi-modal cueing, HEP, and activity modification. Patient articulated understanding and returned demonstration.  Patient Response to interventions: Comfortable to add seated AAROM abduction at table to HEP  ASSESSMENT Patient presents to clinic with excellent motivation to participate in therapy. Patient demonstrates deficits in scar mobility, LUE ROM, LUE function, posture, and pain. Patient able to achieve AAROM L shoulder ER to 20 degrees of ER at ~70 degrees of abduction during today's session and responded positively to all interventions. Patient will benefit from continued skilled therapeutic intervention to address remaining deficits in scar mobility, LUE ROM, LUE function, posture, and pain in order to increase function and improve overall QOL.      PT Long Term Goals - 09/09/20 0836       PT LONG TERM GOAL #1  Title Patient will be independent with HEP in order to decrease L arm/chest pain and increase function in order to improve pain-free participation at home and in the community.    Baseline IE: not initiated    Time 6    Period Weeks    Status New    Target Date 10/20/20      PT LONG TERM GOAL #2   Title Patient will decrease daily L arm pain as reported on NPRS by at least 2 points in order to demonstrate clinically significant reduction in L arm pain.    Baseline IE: 5/10 (with medication)    Time 6    Period Weeks    Status New    Target Date 10/20/20      PT LONG TERM GOAL #3   Title Patient will  demonstrate improved L shoulder flexion and abduction to within 10 degrees of RUE for improved participation at home and in the community.    Baseline IE:    Time 6    Period Weeks    Status New    Target Date 10/20/20      PT LONG TERM GOAL #4   Title Patient will report "no difficulty" with performing overhead tasks and reach shelves at or above shoulder height for improved participation at home and in the community.    Baseline IE: "little difficulty"    Time 6    Period Weeks    Status New    Target Date 10/20/20                   Plan - 09/15/20 1329     Clinical Impression Statement Patient presents to clinic with excellent motivation to participate in therapy. Patient demonstrates deficits in scar mobility, LUE ROM, LUE function, posture, and pain. Patient able to achieve AAROM L shoulder ER to 20 degrees of ER at ~70 degrees of abduction during today's session and responded positively to all interventions. Patient will benefit from continued skilled therapeutic intervention to address remaining deficits in scar mobility, LUE ROM, LUE function, posture, and pain in order to increase function and improve overall QOL.    Personal Factors and Comorbidities Past/Current Experience;Time since onset of injury/illness/exacerbation;Comorbidity 3+    Comorbidities asthma, hx of breast cancer, mastodynia, chronic bronchitis    Examination-Activity Limitations Bathing;Reach Overhead;Lift    Examination-Participation Restrictions Occupation;Driving;Laundry;Shop;Yard Work    [employer] officer Evolving/Moderate complexity    Rehab Potential Good    PT Frequency 3x / week    PT Duration 6 weeks    PT Treatment/Interventions ADLs/Self Care Home Management;Cryotherapy;Moist Heat;Therapeutic exercise;Neuromuscular re-education;Patient/family education;Manual techniques;Scar mobilization;Taping;Joint Manipulations;Dry needling;Passive range of motion    PT Next Visit Plan  manual; low load long duration stretches    PT Home Exercise Plan continue scar massage; low angle doorway stretch    Consulted and Agree with Plan of Care Patient             Patient will benefit from skilled therapeutic intervention in order to improve the following deficits and impairments:  Decreased range of motion, Increased fascial restricitons, Hypomobility, Impaired UE functional use, Pain, Postural dysfunction, Decreased scar mobility  Visit Diagnosis: Decreased range of motion of left shoulder  Pain in left arm  Abnormal posture     Problem List Patient Active Problem List   Diagnosis Date Noted   Family history of colon cancer 04/23/2019   Simple chronic bronchitis ([place]) 05/30/2017   Breast pain 12/20/2014   Family history  of breast cancer 05/28/2014   Current smoker 05/28/2014   Breast cancer ([place]) 05/28/2014   Breast cancer, right breast ([place]) 03/28/2014    Myles Gip PT, DPT [phone]  09/15/2020, 2:40 PM  [facility] [address] [place], [place], [address] Phone: [phone]   Fax:  [phone]  Name: Jill Walsh MRN: [id number] Date of Birth: 10-03-1970

## 2020-09-17 ENCOUNTER — Ambulatory Visit: Payer: 59 | Admitting: Physical Therapy

## 2020-09-19 ENCOUNTER — Encounter: Payer: Self-pay | Admitting: Physical Therapy

## 2020-09-19 ENCOUNTER — Ambulatory Visit: Payer: 59 | Admitting: Physical Therapy

## 2020-09-19 ENCOUNTER — Other Ambulatory Visit: Payer: Self-pay

## 2020-09-19 DIAGNOSIS — R293 Abnormal posture: Secondary | ICD-10-CM

## 2020-09-19 DIAGNOSIS — M25612 Stiffness of left shoulder, not elsewhere classified: Secondary | ICD-10-CM | POA: Diagnosis not present

## 2020-09-19 DIAGNOSIS — M79602 Pain in left arm: Secondary | ICD-10-CM

## 2020-09-19 NOTE — Therapy (Signed)
[facility] [address] [place], [place], [address] Phone: [phone]   Fax:  [phone]  Physical Therapy Treatment  Patient Details  Name: Jill Walsh MRN: [id number] Date of Birth: [birth year] Referring Provider (PT): Verlon Au   Encounter Date: 09/19/2020   PT End of Session - 09/19/20 1008     Visit Number 4    Number of Visits 20    Date for PT Re-Evaluation 10/20/20    Authorization Type IE 09/08/2020    PT Start Time 1000    PT Stop Time 1040    PT Time Calculation (min) 40 min    Activity Tolerance Patient tolerated treatment well;Patient limited by pain    Behavior During Therapy [facility] for tasks assessed/performed             Past Medical History:  Diagnosis Date   Asthma    Breast cancer ([place]) 2016   RT MASTECTOMY    Past Surgical History:  Procedure Laterality Date   BREAST BIOPSY Left    02/28/14 negative   BREAST BIOPSY Right     02/2014 malignant   COLONOSCOPY WITH PROPOFOL N/A 11/13/2018   Procedure: COLONOSCOPY WITH PROPOFOL;  Surgeon: Jonathon Bellows, MD;  Location[facility] ENDOSCOPY;  Service: Gastroenterology;  Laterality: N/A;   MASTECTOMY Right 2016   BREAST CA   TOTAL MASTECTOMY Left 05/29/2020   Procedure: TOTAL MASTECTOMY;  Surgeon: Benjamine Sprague, DO;  Location: [facility] ORS;  Service: General;  Laterality: Left;   TUBAL LIGATION      There were no vitals filed for this visit.   Subjective Assessment - 09/19/20 1005     Subjective Patient reports that after last session she was sore and achey that lasted for about 2 days with occasional sharp pains. Patient kept performing home stretches and is able ot do supported AAROM L shoulder abduction from the hand with roughly 85 degrees of motion.    Limitations Lifting;House hold activities    Currently in Pain? Yes    Pain Score 5     Pain Location [place]    Pain Onset More than a month ago             TREATMENT  Therapeutic  Exercise: Finger ladder, scaption, 5 breath hold, x8 reps for improved ROM, 1 min rest between reps for improved pain tolerance.  Seated pulleys for improved ROM, x2 min each:   Flexion   Scaption  Flexion (facing) Patient education on monitoring pain response and modifying or discontinuing stretches if pain increased > 2 points on NPRS.    Patient educated throughout session on appropriate technique and form using multi-modal cueing, HEP, and activity modification. Patient articulated understanding and returned demonstration.  Patient Response to interventions: Comfortable to reduce frequency to 1x/week. 6/10 pain  ASSESSMENT Patient presents to clinic with excellent motivation to participate in therapy. Patient demonstrates deficits in scar mobility, LUE ROM, LUE function, posture, and pain. Patient with improved response to L shoulder AAROM performed without manual cueing during today's session and responded positively to all interventions. Patient will benefit from continued skilled therapeutic intervention to address remaining deficits in scar mobility, LUE ROM, LUE function, posture, and pain in order to increase function and improve overall QOL.    PT Long Term Goals - 09/09/20 0836       PT LONG TERM GOAL #1   Title Patient will be independent with HEP in order to decrease L arm/chest pain and increase function  in order to improve pain-free participation at home and in the community.    Baseline IE: not initiated    Time 6    Period Weeks    Status New    Target Date 10/20/20      PT LONG TERM GOAL #2   Title Patient will decrease daily L arm pain as reported on NPRS by at least 2 points in order to demonstrate clinically significant reduction in L arm pain.    Baseline IE: 5/10 (with medication)    Time 6    Period Weeks    Status New    Target Date 10/20/20      PT LONG TERM GOAL #3   Title Patient will demonstrate improved L shoulder flexion and abduction to within 10  degrees of RUE for improved participation at home and in the community.    Baseline IE:    Time 6    Period Weeks    Status New    Target Date 10/20/20      PT LONG TERM GOAL #4   Title Patient will report "no difficulty" with performing overhead tasks and reach shelves at or above shoulder height for improved participation at home and in the community.    Baseline IE: "little difficulty"    Time 6    Period Weeks    Status New    Target Date 10/20/20                   Plan - 09/19/20 1008     Clinical Impression Statement Patient presents to clinic with excellent motivation to participate in therapy. Patient demonstrates deficits in scar mobility, LUE ROM, LUE function, posture, and pain. Patient with improved response to L shoulder AAROM performed without manual cueing during today's session and responded positively to all interventions. Patient will benefit from continued skilled therapeutic intervention to address remaining deficits in scar mobility, LUE ROM, LUE function, posture, and pain in order to increase function and improve overall QOL.    Personal Factors and Comorbidities Past/Current Experience;Time since onset of injury/illness/exacerbation;Comorbidity 3+    Comorbidities asthma, hx of breast cancer, mastodynia, chronic bronchitis    Examination-Activity Limitations Bathing;Reach Overhead;Lift    Examination-Participation Restrictions Occupation;Driving;Laundry;Shop;Yard Work    [employer] officer Evolving/Moderate complexity    Rehab Potential Good    PT Frequency 3x / week    PT Duration 6 weeks    PT Treatment/Interventions ADLs/Self Care Home Management;Cryotherapy;Moist Heat;Therapeutic exercise;Neuromuscular re-education;Patient/family education;Manual techniques;Scar mobilization;Taping;Joint Manipulations;Dry needling;Passive range of motion    PT Next Visit Plan manual; low load long duration stretches    PT Home Exercise Plan continue  scar massage; low angle doorway stretch    Consulted and Agree with Plan of Care Patient             Patient will benefit from skilled therapeutic intervention in order to improve the following deficits and impairments:  Decreased range of motion, Increased fascial restricitons, Hypomobility, Impaired UE functional use, Pain, Postural dysfunction, Decreased scar mobility  Visit Diagnosis: Decreased range of motion of left shoulder  Pain in left arm  Abnormal posture     Problem List Patient Active Problem List   Diagnosis Date Noted   Family history of colon cancer 04/23/2019   Simple chronic bronchitis ([place]) 05/30/2017   Breast pain 12/20/2014   Family history of breast cancer 05/28/2014   Current smoker 05/28/2014   Breast cancer ([place]) 05/28/2014   Breast cancer, right breast ([place]) 03/28/2014  Myles Gip PT, DPT [phone]  09/19/2020, 12:01 PM  [facility] [address] [place], [place], [address] Phone: [phone]   Fax:  [phone]  Name: Jill Walsh MRN: [id number] Date of Birth: 1970/07/19

## 2020-09-22 ENCOUNTER — Ambulatory Visit: Payer: 59 | Admitting: Physical Therapy

## 2020-09-24 ENCOUNTER — Ambulatory Visit: Payer: 59 | Admitting: Physical Therapy

## 2020-09-29 ENCOUNTER — Ambulatory Visit: Payer: 59 | Admitting: Physical Therapy

## 2020-10-01 ENCOUNTER — Encounter: Payer: 59 | Admitting: Physical Therapy

## 2020-10-03 ENCOUNTER — Encounter: Payer: Self-pay | Admitting: Physical Therapy

## 2020-10-03 ENCOUNTER — Ambulatory Visit: Payer: 59 | Admitting: Physical Therapy

## 2020-10-03 ENCOUNTER — Other Ambulatory Visit: Payer: Self-pay

## 2020-10-03 DIAGNOSIS — M25612 Stiffness of left shoulder, not elsewhere classified: Secondary | ICD-10-CM

## 2020-10-03 DIAGNOSIS — M79602 Pain in left arm: Secondary | ICD-10-CM

## 2020-10-03 DIAGNOSIS — R293 Abnormal posture: Secondary | ICD-10-CM

## 2020-10-03 NOTE — Therapy (Signed)
[facility] [address] [place], [place], [address] Phone: [phone]   Fax:  [phone]  Physical Therapy Treatment  Patient Details  Name: Jill Walsh MRN: [id number] Date of Birth: 12-12-1970 Referring Provider (PT): Verlon Au   Encounter Date: 10/03/2020   PT End of Session - 10/03/20 1008     Visit Number 5    Number of Visits 20    Date for PT Re-Evaluation 10/20/20    Authorization Type IE 09/08/2020    PT Start Time 1000    PT Stop Time 1040    PT Time Calculation (min) 40 min    Activity Tolerance Patient tolerated treatment well    Behavior During Therapy [facility] for tasks assessed/performed             Past Medical History:  Diagnosis Date   Asthma    Breast cancer ([place]) 2016   RT MASTECTOMY    Past Surgical History:  Procedure Laterality Date   BREAST BIOPSY Left    02/28/14 negative   BREAST BIOPSY Right     02/2014 malignant   COLONOSCOPY WITH PROPOFOL N/A 11/13/2018   Procedure: COLONOSCOPY WITH PROPOFOL;  Surgeon: Jonathon Bellows, MD;  Location: [facility] ENDOSCOPY;  Service: Gastroenterology;  Laterality: N/A;   MASTECTOMY Right 2016   BREAST CA   TOTAL MASTECTOMY Left 05/29/2020   Procedure: TOTAL MASTECTOMY;  Surgeon: Benjamine Sprague, DO;  Location: [facility] ORS;  Service: General;  Laterality: Left;   TUBAL LIGATION      There were no vitals filed for this visit.   Subjective Assessment - 10/03/20 1007     Subjective Patient notes that she has continued to do HEP with good success. She feels she is able to get her arm higher with pain controlled, but still having pain at end range when moving against gravity.    Limitations Lifting;House hold activities    Currently in Pain? No/denies    Pain Onset More than a month ago                 TREATMENT  Therapeutic Exercise: UBE, reverse, for improved ROM, x4 min Finger ladder, abduction, 5- 10 sec hold, x5 reps for improved ROM, LUE Finger  ladder, flexion, 5-10 sec hold, x5 reps for improved ROM, LUE Seated pulleys for improved ROM, x2 min each:   Flexion   Scaption  Abduction (became tearful during) AAROM, LUE, standing with dowel: flexion, abduction, extension, x10 each Patient education on gentle strengthening using kitchen cabinets and canned goods for improved tolerance to work related activity.   Patient educated throughout session on appropriate technique and form using multi-modal cueing, HEP, and activity modification. Patient articulated understanding and returned demonstration.  Patient Response to interventions: Notes numb feeling in L axilla  ASSESSMENT Patient presents to clinic with excellent motivation to participate in therapy. Patient demonstrates deficits in scar mobility, LUE ROM, LUE function, posture, and pain. Patient performed all exercises with excellent effort during today's session and responded positively to all interventions. Patient will benefit from continued skilled therapeutic intervention to address remaining deficits in scar mobility, LUE ROM, LUE function, posture, and pain in order to increase function and improve overall QOL.    PT Long Term Goals - 09/09/20 0836       PT LONG TERM GOAL #1   Title Patient will be independent with HEP in order to decrease L arm/chest pain and increase function in order to improve pain-free participation at  home and in the community.    Baseline IE: not initiated    Time 6    Period Weeks    Status New    Target Date 10/20/20      PT LONG TERM GOAL #2   Title Patient will decrease daily L arm pain as reported on NPRS by at least 2 points in order to demonstrate clinically significant reduction in L arm pain.    Baseline IE: 5/10 (with medication)    Time 6    Period Weeks    Status New    Target Date 10/20/20      PT LONG TERM GOAL #3   Title Patient will demonstrate improved L shoulder flexion and abduction to within 10 degrees of RUE for  improved participation at home and in the community.    Baseline IE:    Time 6    Period Weeks    Status New    Target Date 10/20/20      PT LONG TERM GOAL #4   Title Patient will report "no difficulty" with performing overhead tasks and reach shelves at or above shoulder height for improved participation at home and in the community.    Baseline IE: "little difficulty"    Time 6    Period Weeks    Status New    Target Date 10/20/20                   Plan - 10/03/20 1049     Clinical Impression Statement Patient presents to clinic with excellent motivation to participate in therapy. Patient demonstrates deficits in scar mobility, LUE ROM, LUE function, posture, and pain. Patient performed all exercises with excellent effort during today's session and responded positively to all interventions. Patient will benefit from continued skilled therapeutic intervention to address remaining deficits in scar mobility, LUE ROM, LUE function, posture, and pain in order to increase function and improve overall QOL.    Personal Factors and Comorbidities Past/Current Experience;Time since onset of injury/illness/exacerbation;Comorbidity 3+    Comorbidities asthma, hx of breast cancer, mastodynia, chronic bronchitis    Examination-Activity Limitations Bathing;Reach Overhead;Lift    Examination-Participation Restrictions Occupation;Driving;Laundry;Shop;Yard Work    Merchant navy officer Evolving/Moderate complexity    Rehab Potential Good    PT Frequency 3x / week    PT Duration 6 weeks    PT Treatment/Interventions ADLs/Self Care Home Management;Cryotherapy;Moist Heat;Therapeutic exercise;Neuromuscular re-education;Patient/family education;Manual techniques;Scar mobilization;Taping;Joint Manipulations;Dry needling;Passive range of motion    PT Next Visit Plan gentle strengthening    PT Home Exercise Plan AROM, pulleys, wall walks    Consulted and Agree with Plan of Care Patient              Patient will benefit from skilled therapeutic intervention in order to improve the following deficits and impairments:  Decreased range of motion, Increased fascial restricitons, Hypomobility, Impaired UE functional use, Pain, Postural dysfunction, Decreased scar mobility  Visit Diagnosis: Decreased range of motion of left shoulder  Pain in left arm  Abnormal posture     Problem List Patient Active Problem List   Diagnosis Date Noted   Family history of colon cancer 04/23/2019   Simple chronic bronchitis ([place]) 05/30/2017   Breast pain 12/20/2014   Family history of breast cancer 05/28/2014   Current smoker 05/28/2014   Breast cancer ([place]) 05/28/2014   Breast cancer, right breast ([place]) 03/28/2014    Myles Gip PT, DPT [phone]  10/03/2020, 10:57 AM  [place]  [facility] [address] [place], [place], [address] Phone: [phone]   Fax:  [phone]  Name: Jill Walsh MRN: [id number] Date of Birth: 08-07-1970

## 2020-10-06 ENCOUNTER — Encounter: Payer: 59 | Admitting: Physical Therapy

## 2020-10-08 ENCOUNTER — Encounter: Payer: 59 | Admitting: Physical Therapy

## 2020-10-10 ENCOUNTER — Encounter: Payer: 59 | Admitting: Physical Therapy

## 2020-10-15 ENCOUNTER — Encounter: Payer: 59 | Admitting: Physical Therapy

## 2020-10-17 ENCOUNTER — Encounter: Payer: 59 | Admitting: Physical Therapy

## 2020-10-20 ENCOUNTER — Encounter: Payer: 59 | Admitting: Physical Therapy

## 2020-10-22 ENCOUNTER — Encounter: Payer: 59 | Admitting: Physical Therapy

## 2020-10-24 ENCOUNTER — Encounter: Payer: 59 | Admitting: Physical Therapy

## 2020-10-27 ENCOUNTER — Encounter: Payer: 59 | Admitting: Physical Therapy

## 2020-10-29 ENCOUNTER — Encounter: Payer: 59 | Admitting: Physical Therapy

## 2020-10-31 ENCOUNTER — Other Ambulatory Visit: Payer: Self-pay

## 2020-10-31 ENCOUNTER — Encounter: Payer: Self-pay | Admitting: Physical Therapy

## 2020-10-31 ENCOUNTER — Ambulatory Visit: Payer: Self-pay | Attending: Nurse Practitioner | Admitting: Physical Therapy

## 2020-10-31 DIAGNOSIS — R293 Abnormal posture: Secondary | ICD-10-CM | POA: Insufficient documentation

## 2020-10-31 DIAGNOSIS — M25612 Stiffness of left shoulder, not elsewhere classified: Secondary | ICD-10-CM | POA: Insufficient documentation

## 2020-10-31 DIAGNOSIS — M79602 Pain in left arm: Secondary | ICD-10-CM | POA: Insufficient documentation

## 2020-10-31 NOTE — Therapy (Signed)
[facility] [address] [place], [place], [address] Phone: [phone]   Fax:  [phone]  Physical Therapy Treatment  Patient Details  Name: Jill Walsh MRN: [id number] Date of Birth: 07/25/1970 Referring Provider (PT): Verlon Au   Encounter Date: 10/31/2020   PT End of Session - 10/31/20 1052     Visit Number 6    Number of Visits 20    Date for PT Re-Evaluation 10/31/20    Authorization Type IE 09/08/2020    PT Start Time 1015    PT Stop Time 1045    PT Time Calculation (min) 30 min    Activity Tolerance Patient tolerated treatment well    Behavior During Therapy [facility] for tasks assessed/performed             Past Medical History:  Diagnosis Date   Asthma    Breast cancer ([place]) 2016   RT MASTECTOMY    Past Surgical History:  Procedure Laterality Date   BREAST BIOPSY Left    02/28/14 negative   BREAST BIOPSY Right     02/2014 malignant   COLONOSCOPY WITH PROPOFOL N/A 11/13/2018   Procedure: COLONOSCOPY WITH PROPOFOL;  Surgeon: Jonathon Bellows, MD;  Location: [facility] ENDOSCOPY;  Service: Gastroenterology;  Laterality: N/A;   MASTECTOMY Right 2016   BREAST CA   TOTAL MASTECTOMY Left 05/29/2020   Procedure: TOTAL MASTECTOMY;  Surgeon: Benjamine Sprague, DO;  Location: [facility] ORS;  Service: General;  Laterality: Left;   TUBAL LIGATION      There were no vitals filed for this visit.   Subjective Assessment - 10/31/20 1114     Subjective Patient states that she is ready to go back to work. She notes that her pain has resolved to 0/10 daily and now in place of pain she has some numbness with stretching to end range, but notes it is manageable. Patient has been consistently doing HEP and is now able to take dishes off overhead cabinets/shelves, move a gallon of milk from the top shelf in the refridgerator, and is able to indiependently lift and lower a 5# weight from an overhead shelf. She has tried 10#, but doesn't feel ready  for this yet.    Limitations Lifting;House hold activities    Currently in Pain? No/denies    Pain Onset More than a month ago            TREATMENT  Therapeutic Exercise: UBE, reverse, for improved ROM, x6 min Finger ladder, abduction, 5- 10 sec hold, x5 reps for improved ROM, LUE Finger ladder, flexion, 5-10 sec hold, x5 reps for improved ROM, LUE Reassessed goals; see below.    Patient educated throughout session on appropriate technique and form using multi-modal cueing, HEP, and activity modification. Patient articulated understanding and returned demonstration.  Patient Response to interventions: Comfortable to self-manage until return to work  ASSESSMENT Patient presents to clinic with excellent motivation to participate in therapy and confident to trial self-management. Patient demonstrates deficits in scar mobility, but has restored LUE ROM and function, improved posture, and resolved pain. Patient has been a consistent and active participant in her care and even in the midst of illness continued to work on her physical therapy HEP. At this time, patient has achieved all goals set forth at the start of physical therapy and feels comfortable to discharge to self-management of scar mobility, LUE ROM, LUE function, posture, and pain in order to continue increasing function and improving overall QOL.  PT Long Term Goals - 10/31/20 1037       PT LONG TERM GOAL #1   Title Patient will be independent with HEP in order to decrease L arm/chest pain and increase function in order to improve pain-free participation at home and in the community.    Baseline IE: not initiated; 9/23: IND    Time 6    Period Weeks    Status Achieved      PT LONG TERM GOAL #2   Title Patient will decrease daily L arm pain as reported on NPRS by at least 2 points in order to demonstrate clinically significant reduction in L arm pain.    Baseline IE: 5/10 (with medication); 9/23: 0/10    Time 6     Period Weeks    Status Achieved      PT LONG TERM GOAL #3   Title Patient will demonstrate improved L shoulder flexion and abduction to within 10 degrees of RUE for improved participation at home and in the community.    Baseline 9/23: R shoulder flexion 168, L shoulder flexion 160    Time 6    Period Weeks    Status Achieved    Target Date 12/12/20      PT LONG TERM GOAL #4   Title Patient will report "no difficulty" with performing overhead tasks and reach shelves at or above shoulder height for improved participation at home and in the community.    Baseline IE: "little difficulty"; 9/23: "no difficulty"    Time 6    Period Weeks    Status Achieved                   Plan - 10/31/20 1051     Clinical Impression Statement Patient presents to clinic with excellent motivation to participate in therapy and confident to trial self-management. Patient demonstrates deficits in scar mobility, but has restored LUE ROM and function, improved posture, and resolved pain. Patient has been a consistent and active participant in her care and even in the midst of illness continued to work on her physical therapy HEP. At this time, patient has achieved all goals set forth at the start of physical therapy and feels comfortable to discharge to self-management of scar mobility, LUE ROM, LUE function, posture, and pain in order to continue increasing function and improving overall QOL.    Personal Factors and Comorbidities Past/Current Experience;Time since onset of injury/illness/exacerbation;Comorbidity 3+    Comorbidities asthma, hx of breast cancer, mastodynia, chronic bronchitis    Examination-Activity Limitations Bathing;Reach Overhead;Lift    Examination-Participation Restrictions Occupation;Driving;Laundry;Shop;Yard Work    Merchant navy officer Evolving/Moderate complexity    Rehab Potential Good    PT Frequency One time visit    PT Treatment/Interventions ADLs/Self Care  Home Management;Cryotherapy;Moist Heat;Therapeutic exercise;Neuromuscular re-education;Patient/family education;Manual techniques;Scar mobilization;Taping;Joint Manipulations;Dry needling;Passive range of motion    Consulted and Agree with Plan of Care Patient             Patient will benefit from skilled therapeutic intervention in order to improve the following deficits and impairments:  Decreased range of motion, Increased fascial restricitons, Hypomobility, Impaired UE functional use, Pain, Postural dysfunction, Decreased scar mobility  Visit Diagnosis: Decreased range of motion of left shoulder  Pain in left arm  Abnormal posture     Problem List Patient Active Problem List   Diagnosis Date Noted   Family history of colon cancer 04/23/2019   Simple chronic bronchitis ([place]) 05/30/2017   Breast pain 12/20/2014  Family history of breast cancer 05/28/2014   Current smoker 05/28/2014   Breast cancer ([place]) 05/28/2014   Breast cancer, right breast ([place]) 03/28/2014    Myles Gip PT, DPT [phone]  10/31/2020, 11:26 AM  [facility] [address] [place], [place], [address] Phone: [phone]   Fax:  [phone]  Name: Jill Walsh MRN: [id number] Date of Birth: 02-Jul-1970

## 2020-11-03 ENCOUNTER — Encounter: Payer: 59 | Admitting: Physical Therapy

## 2020-11-05 ENCOUNTER — Encounter: Payer: 59 | Admitting: Physical Therapy

## 2020-11-07 ENCOUNTER — Encounter: Payer: 59 | Admitting: Physical Therapy

## 2020-11-10 ENCOUNTER — Other Ambulatory Visit: Payer: 59

## 2020-11-10 ENCOUNTER — Ambulatory Visit: Payer: 59 | Admitting: Hematology and Oncology

## 2021-04-09 ENCOUNTER — Ambulatory Visit
Admission: EM | Admit: 2021-04-09 | Discharge: 2021-04-09 | Disposition: A | Discharge status: Home / Self Care | Payer: Self-pay | Attending: Emergency Medicine | Admitting: Emergency Medicine

## 2021-04-09 ENCOUNTER — Ambulatory Visit (INDEPENDENT_AMBULATORY_CARE_PROVIDER_SITE_OTHER): Payer: 59

## 2021-04-09 ENCOUNTER — Other Ambulatory Visit: Payer: Self-pay

## 2021-04-09 DIAGNOSIS — U071 COVID-19: Secondary | ICD-10-CM | POA: Insufficient documentation

## 2021-04-09 DIAGNOSIS — R059 Cough, unspecified: Secondary | ICD-10-CM | POA: Diagnosis not present

## 2021-04-09 DIAGNOSIS — R0789 Other chest pain: Secondary | ICD-10-CM | POA: Insufficient documentation

## 2021-04-09 DIAGNOSIS — R079 Chest pain, unspecified: Secondary | ICD-10-CM | POA: Diagnosis not present

## 2021-04-09 LAB — RESP PANEL BY RT-PCR (FLU A&B, COVID) ARPGX2
Influenza A by PCR: NEGATIVE
Influenza B by PCR: NEGATIVE
SARS Coronavirus 2 by RT PCR: POSITIVE — AB

## 2021-04-09 MED ORDER — ALBUTEROL SULFATE (2.5 MG/3ML) 0.083% IN NEBU: 2.5000 mg | INHALATION_SOLUTION | RESPIRATORY_TRACT | 0 refills | Status: DC | PRN

## 2021-04-09 MED ORDER — NAPROXEN 500 MG PO TABS: 500.0000 mg | ORAL_TABLET | Freq: Two times a day (BID) | ORAL | 0 refills | Status: AC

## 2021-04-09 MED ORDER — ONDANSETRON 8 MG PO TBDP: ORAL_TABLET | ORAL | 0 refills | Status: DC

## 2021-04-09 MED ORDER — FLUTICASONE PROPIONATE 50 MCG/ACT NA SUSP: 2.0000 | Freq: Every day | NASAL | 0 refills | Status: DC

## 2021-04-09 MED ORDER — MOLNUPIRAVIR EUA 200MG CAPSULE: 4.0000 | ORAL_CAPSULE | Freq: Two times a day (BID) | ORAL | 0 refills | Status: AC

## 2021-04-09 MED ORDER — ONDANSETRON 4 MG PO TBDP
4.0000 mg | ORAL_TABLET | Freq: Once | ORAL | Status: AC
  Administered 2021-04-09: 4 mg via ORAL

## 2021-04-09 NOTE — ED Provider Notes (Signed)
HPI  SUBJECTIVE:  Jill Walsh is a 51 y.o. female who presents with 2 issues: First, she reports sharp left-sided chest pain present only with arm movement and with lying on her left side starting after lifting something heavy 2 days ago.  She states that she felt something pull followed by this pain.  She reports palpitations only when she raises or moves her arm.  She has tried ice packs, ibuprofen 1600 mg.  The ibuprofen helps.  Symptoms are worse with movement, wearing clothing, lying on her left side, coughing.  No calf pain, swelling, surgery in the past 4 weeks, recent immobilization, exogenous estrogen, hemoptysis.  Second, she reports fevers Tmax 102, nausea, vomiting, chills, cough, body aches, nasal congestion, rhinorrhea, wheezing, shortness of breath, dyspnea on exertion starting yesterday.  Headache secondary to cough.  She is tolerating liquids.  No abdominal pain, diarrhea, change in urine output.  No sinus pain or pressure, facial swelling, dental pain, sore throat.  No known exposure to COVID or flu.  She got 2 doses of the COVID-vaccine and this years flu vaccine.  She has been taking Tylenol, ginger ale and using albuterol nebs.  The albuterol helps with the breathing.  She took ibuprofen immediately prior to arrival.  She has a past medical history of asthma, breast cancer status post mastectomy with subsequent breast reconstruction.  No history of MI, coronary disease, diabetes, hypertension, hypercholesterolemia, smoking, PE, DVT, pneumothorax.    Past Medical History:  Diagnosis Date   Asthma    Breast cancer ([place]) 2016   RT MASTECTOMY    Past Surgical History:  Procedure Laterality Date   BREAST BIOPSY Left    02/28/14 negative   BREAST BIOPSY Right     02/2014 malignant   COLONOSCOPY WITH PROPOFOL N/A 11/13/2018   Procedure: COLONOSCOPY WITH PROPOFOL;  Surgeon: Jonathon Bellows, MD;  Location: [facility] END[facility]COPY;  Service: Gastroenterology;  Laterality: N/A;   MASTECTOMY  Right 2016   BREAST CA   TOTAL MASTECTOMY Left 05/29/2020   Procedure: TOTAL MASTECTOMY;  Surgeon: Benjamine Sprague, DO;  Location: [facility] ORS;  Service: General;  Laterality: Left;   TUBAL LIGATION      Family History  Problem Relation Age of Onset   Cancer Mother    Breast cancer Mother 9   Stroke Brother    Cancer Maternal Aunt     Social History   Tobacco Use   Smoking status: Former    Years: 15.00    Types: Cigarettes, E-cigarettes    Quit date: 05/23/2014    Years since quitting: 6.8   Smokeless tobacco: Former  Scientific laboratory technician Use: Former  Substance Use Topics   Alcohol use: No   Drug use: No    No current facility-administered medications for this encounter.  Current Outpatient Medications:    albuterol (PROVENTIL) (2.5 MG/3ML) 0.083% nebulizer solution, Take 3 mLs (2.5 mg total) by nebulization every 4 (four) hours as needed for wheezing or shortness of breath., Disp: 75 mL, Rfl: 0   fluticasone (FLONASE) 50 MCG/ACT nasal spray, Place 2 sprays into both nostrils daily., Disp: 16 g, Rfl: 0   molnupiravir EUA (LAGEVRIO) 200 mg CAPS capsule, Take 4 capsules (800 mg total) by mouth 2 (two) times daily for 5 days., Disp: 40 capsule, Rfl: 0   naproxen (NAPROSYN) 500 MG tablet, Take 1 tablet (500 mg total) by mouth 2 (two) times daily for 7 days., Disp: 14 tablet, Rfl: 0   ondansetron (ZOFRAN-ODT) 8 MG disintegrating  tablet, 1/2- 1 tablet q 8 hr prn nausea, vomiting, Disp: 20 tablet, Rfl: 0   gabapentin (NEURONTIN) 100 MG capsule, Take by mouth., Disp: , Rfl:    hydrOXYzine (ATARAX/VISTARIL) 25 MG tablet, Take 1 tablet (25 mg total) by mouth every 6 (six) hours as needed for itching., Disp: 20 tablet, Rfl: 0  Allergies  Allergen Reactions   Tramadol Other (See Comments)    Real bad headache     ROS  As noted in HPI.   Physical Exam  BP (!) 148/90 (BP Location: Left Arm)    Pulse (!) 122    Temp 98.8 F (37.1 C) (Oral)    Resp (!) 24    Wt 97.9 kg    LMP  12/03/2019    SpO2 96%    BMI 35.92 kg/m   Constitutional: Well developed, well nourished, tearful Eyes:  EOMI, conjunctiva normal bilaterally HENT: Normocephalic, atraumatic,mucus membranes moist.  Positive nasal congestion Respiratory: Normal inspiratory effort, lungs clear bilaterally. Cardiovascular: Regular tachycardia, no murmurs, rubs, gallops.  Positive reproducible left-sided mid anterior chest wall tenderness along costochondral junctions. GI: nondistended skin: No rash, skin intact Musculoskeletal: Calves symmetric, nontender, no edema no palpable cord Neurologic: Alert & oriented x 3, no focal neuro deficits Psychiatric: Speech and behavior appropriate   ED Course   Medications  ondansetron (ZOFRAN-ODT) disintegrating tablet 4 mg (4 mg Oral Given 04/09/21 0956)    Orders Placed This Encounter  Procedures   Resp Panel by RT-PCR (Flu A&B, Covid) Nasopharyngeal Swab    Standing Status:   Standing    Number of Occurrences:   1   DG Chest 2 View    Standing Status:   Standing    Number of Occurrences:   1    Order Specific Question:   Reason for Exam (SYMPTOM  OR DIAGNOSIS REQUIRED)    Answer:   Cough, left-sided chest pain   Airborne and Contact precautions    Standing Status:   Standing    Number of Occurrences:   1   ED EKG    Standing Status:   Standing    Number of Occurrences:   1    Order Specific Question:   Reason for Exam    Answer:   Chest Pain    Order Specific Question:   Release to patient    Answer:   Immediate   EKG 12-Lead    Standing Status:   Standing    Number of Occurrences:   1   EKG 12-Lead    Standing Status:   Standing    Number of Occurrences:   1    Results for orders placed or performed during the hospital encounter of 04/09/21 (from the past 24 hour(s))  Resp Panel by RT-PCR (Flu A&B, Covid) Nasopharyngeal Swab     Status: Abnormal   Collection Time: 04/09/21  9:57 AM   Specimen: Nasopharyngeal Swab; Nasopharyngeal(NP) swabs in  vial transport medium  Result Value Ref Range   SARS Coronavirus 2 by RT PCR POSITIVE (A) NEGATIVE   Influenza A by PCR NEGATIVE NEGATIVE   Influenza B by PCR NEGATIVE NEGATIVE   DG Chest 2 View  Result Date: 04/09/2021 CLINICAL DATA:  51 year old female with cough and left side chest pain. Upper respiratory symptoms recently. EXAM: CHEST - 2 VIEW COMPARISON:  CTA chest 08/21/2019 and earlier. FINDINGS: Lung volumes and mediastinal contours remain normal. Visualized tracheal air column is within normal limits. Both lungs appear clear. No pneumothorax or pleural effusion.  No acute osseous abnormality identified. Negative visible bowel gas. IMPRESSION: Negative.  No acute cardiopulmonary abnormality. Electronically Signed   By: Genevie Ann M.D.   On: 04/09/2021 10:58    ED Clinical Impression  1. COVID-19 virus infection   2. Musculoskeletal chest pain      ED Assessment/Plan  1. Chest pain.    EKG: Sinus tachycardia, rate 106, left axis deviation.  Normal intervals.  No ST T wave changes.  No changes compared to EKG from 08/2019.  Reviewed imaging independently.  Chest x-ray normal.  See radiology report for details.  Her chest pain appears to be very musculoskeletal.  She had a clear inciting event, is reproducible, and is worse with movement.  Her EKG and chest x-ray are reassuring.  ACS, PE low on the differential.  Will send home with Naprosyn/Tylenol for a week  2.  Respiratory symptoms starting yesterday: Patient COVID positive.  This most likely explains her tachycardia and her other symptoms.  Home with Molnupiravir, Flonase, Mucinex D, saline nasal irrigation, Zofran. will refill her albuterol nebs.  COVID work note.  Patient is to get out and walk a little bit every day to prevent PE/pneumonia.  ER return precautions given.  Discussed labs, imaging, MDM, treatment plan, and plan for follow-up with patient. Discussed sn/sx that should prompt return to the ED. patient agrees with plan.    Meds ordered this encounter  Medications   ondansetron (ZOFRAN-ODT) disintegrating tablet 4 mg   albuterol (PROVENTIL) (2.5 MG/3ML) 0.083% nebulizer solution    Sig: Take 3 mLs (2.5 mg total) by nebulization every 4 (four) hours as needed for wheezing or shortness of breath.    Dispense:  75 mL    Refill:  0   fluticasone (FLONASE) 50 MCG/ACT nasal spray    Sig: Place 2 sprays into both nostrils daily.    Dispense:  16 g    Refill:  0   molnupiravir EUA (LAGEVRIO) 200 mg CAPS capsule    Sig: Take 4 capsules (800 mg total) by mouth 2 (two) times daily for 5 days.    Dispense:  40 capsule    Refill:  0   naproxen (NAPROSYN) 500 MG tablet    Sig: Take 1 tablet (500 mg total) by mouth 2 (two) times daily for 7 days.    Dispense:  14 tablet    Refill:  0   ondansetron (ZOFRAN-ODT) 8 MG disintegrating tablet    Sig: 1/2- 1 tablet q 8 hr prn nausea, vomiting    Dispense:  20 tablet    Refill:  0      *This clinic note was created using [employer]. Therefore, there may be occasional mistakes despite careful proofreading.  ?    Melynda Ripple, MD 04/09/21 1134

## 2021-04-09 NOTE — ED Triage Notes (Signed)
Patient is here for "a lot of congestion, cough'. Chest pains "where they did my surgery, left breast, mastectomy". "Upper respiratory symptoms started Tuesday night". Recently lifting something at work & left chest/breast area started hurting, began with tightness and cough non stop. No fever.  ?

## 2021-04-09 NOTE — Discharge Instructions (Addendum)
Your EKG, chest x-ray are normal.  I suspect that you have strained your chest wall/muscle.  Take Naprosyn with 1000 mg of Tylenol twice a day.  You may take an additional 1000 mg of Tylenol 2 more times a day.  Do not exceed 4000 mg of Tylenol from all sources in 1 day. ? ?You are also COVID-positive.  Finish the [employer], even if you feel better, start Flonase, Mucinex D, saline nasal irrigation with a [employer]ta [employer] Med rinse and distilled water as often as you want, Zofran, push electrolyte containing fluids such as Pedialyte or liquid IV powder that you can mix into her drink.  Zofran for nausea and vomiting.  It may make you constipated.  Use your albuterol nebs every 4-6 hours as needed.  Make sure to get out and walk a little bit every day as we discussed. ?

## 2021-04-15 ENCOUNTER — Ambulatory Visit
Admission: EM | Admit: 2021-04-15 | Discharge: 2021-04-15 | Disposition: A | Discharge status: Home / Self Care | Payer: 59 | Attending: Physician Assistant | Admitting: Physician Assistant

## 2021-04-15 ENCOUNTER — Encounter: Payer: Self-pay | Admitting: Emergency Medicine

## 2021-04-15 ENCOUNTER — Other Ambulatory Visit: Payer: Self-pay

## 2021-04-15 DIAGNOSIS — U071 COVID-19: Secondary | ICD-10-CM

## 2021-04-15 DIAGNOSIS — R051 Acute cough: Secondary | ICD-10-CM | POA: Diagnosis not present

## 2021-04-15 DIAGNOSIS — J029 Acute pharyngitis, unspecified: Secondary | ICD-10-CM | POA: Diagnosis not present

## 2021-04-15 LAB — GROUP A STREP BY PCR: Group A Strep by PCR: NOT DETECTED

## 2021-04-15 MED ORDER — LIDOCAINE VISCOUS HCL 2 % MT SOLN: 15.0000 mL | OROMUCOSAL | 0 refills | Status: DC | PRN

## 2021-04-15 MED ORDER — CHERATUSSIN AC 100-10 MG/5ML PO SOLN: 10.0000 mL | Freq: Three times a day (TID) | ORAL | 0 refills | Status: DC | PRN

## 2021-04-15 NOTE — ED Provider Notes (Signed)
[facility]    CSN: [id number] Arrival date & time: 04/15/21  0086      History   Chief Complaint Chief Complaint  Patient presents with   Cough    HPI Jill Walsh is a 51 y.o. female presenting for continued COVID symptoms.  Patient has been ill for 7 to 8 days.  Tested positive for COVID-19 6 days ago at [facility] urgent care.  Reports that she is still not feeling well.  Did have a fever up to 101 degrees yesterday but no fever in the past 24 hours.  Reports she has had a lot of difficulty sleeping and resting overall due to cough.  Reports she is coughing so much that it is causing her throat to hurt.  No breathing trouble.  She does have asthma but has used her nebulizer/inhaler if needed and has not needed to use it much.  No nausea/vomiting or diarrhea.  Patient has been taking the Molnupiravir as prescribed.  Also taking multiple over-the-counter cough medicines and Flonase.  Patient reports she is supposed to go back to work but does not feel ready to do so.  No other concerns.  HPI  Past Medical History:  Diagnosis Date   Asthma    Breast cancer ([place]) 2016   RT MASTECTOMY    Patient Active Problem List   Diagnosis Date Noted   Family history of colon cancer 04/23/2019   Simple chronic bronchitis ([place]) 05/30/2017   Breast pain 12/20/2014   Family history of breast cancer 05/28/2014   Current smoker 05/28/2014   Breast cancer ([place]) 05/28/2014   Breast cancer, right breast ([place]) 03/28/2014    Past Surgical History:  Procedure Laterality Date   BREAST BIOPSY Left    02/28/14 negative   BREAST BIOPSY Right     02/2014 malignant   COLONOSCOPY WITH PROPOFOL N/A 11/13/2018   Procedure: COLONOSCOPY WITH PROPOFOL;  Surgeon: Jonathon Bellows, MD;  Location: [facility] ENDOSCOPY;  Service: Gastroenterology;  Laterality: N/A;   MASTECTOMY Right 2016   BREAST CA   TOTAL MASTECTOMY Left 05/29/2020   Procedure: TOTAL MASTECTOMY;  Surgeon: Benjamine Sprague, DO;  Location: [facility] ORS;   Service: General;  Laterality: Left;   TUBAL LIGATION      OB History   No obstetric history on file.      Home Medications    Prior to Admission medications   Medication Sig Start Date End Date Taking? Authorizing Provider  albuterol (PROVENTIL) (2.5 MG/3ML) 0.083% nebulizer solution Take 3 mLs (2.5 mg total) by nebulization every 4 (four) hours as needed for wheezing or shortness of breath. 04/09/21  Yes Melynda Ripple, MD  fluticasone (FLONASE) 50 MCG/ACT nasal spray Place 2 sprays into both nostrils daily. 04/09/21  Yes Melynda Ripple, MD  guaiFENesin-codeine [facility]) 100-10 MG/5ML syrup Take 10 mLs by mouth 3 (three) times daily as needed for cough. 04/15/21  Yes Laurene Footman B, PA-C  hydrOXYzine (ATARAX/VISTARIL) 25 MG tablet Take 1 tablet (25 mg total) by mouth every 6 (six) hours as needed for itching. 05/24/19  Yes Melynda Ripple, MD  lidocaine (XYLOCAINE) 2 % solution Use as directed 15 mLs in the mouth or throat every 3 (three) hours as needed for mouth pain (swish and spit). 04/15/21  Yes Laurene Footman B, PA-C  naproxen (NAPROSYN) 500 MG tablet Take 1 tablet (500 mg total) by mouth 2 (two) times daily for 7 days. 04/09/21 04/16/21 Yes Melynda Ripple, MD  ondansetron (ZOFRAN-ODT) 8 MG disintegrating tablet 1/2- 1  tablet q 8 hr prn nausea, vomiting 04/09/21  Yes Melynda Ripple, MD  gabapentin (NEURONTIN) 100 MG capsule Take by mouth. 06/03/20   [provider]  ipratropium-albuterol (DUONEB) 0.5-2.5 (3) MG/3ML SOLN Take 3 mLs by nebulization every 6 (six) hours as needed. 03/23/18 12/27/18  Juline Patch, MD    Family History Family History  Problem Relation Age of Onset   Cancer Mother    Breast cancer Mother 80   Stroke Brother    Cancer Maternal Aunt     Social History Social History   Tobacco Use   Smoking status: Former    Years: 15.00    Types: Cigarettes, E-cigarettes    Quit date: 05/23/2014    Years since quitting: 6.9   Smokeless  tobacco: Former  Scientific laboratory technician Use: Former  Substance Use Topics   Alcohol use: No   Drug use: No     Allergies   Tramadol   Review of Systems Review of Systems  Constitutional:  Positive for fatigue. Negative for chills, diaphoresis and fever.  HENT:  Positive for congestion, postnasal drip, rhinorrhea and sore throat. Negative for ear pain, sinus pressure and sinus pain.   Respiratory:  Positive for cough. Negative for shortness of breath and wheezing.   Cardiovascular:  Negative for chest pain.  Gastrointestinal:  Negative for abdominal pain, nausea and vomiting.  Musculoskeletal:  Negative for arthralgias and myalgias.  Skin:  Negative for rash.  Neurological:  Negative for weakness and headaches.  Hematological:  Negative for adenopathy.  Psychiatric/Behavioral:  Positive for sleep disturbance.     Physical Exam Triage Vital Signs ED Triage Vitals  Enc Vitals Group     BP      Pulse      Resp      Temp      Temp src      SpO2      Weight      Height      Head Circumference      Peak Flow      Pain Score      Pain Loc      Pain Edu?      Excl. in [place]?    No data found.  Updated Vital Signs BP 131/87 (BP Location: Right Arm)    Pulse 99    Temp 98.5 F (36.9 C) (Oral)    Resp 20    Ht '5\' 5"'$  (1.651 m)    Wt 215 lb 13.3 oz (97.9 kg)    LMP 12/03/2019    SpO2 100%    BMI 35.92 kg/m      Physical Exam Vitals and nursing note reviewed.  Constitutional:      General: She is not in acute distress.    Appearance: Normal appearance. She is ill-appearing. She is not toxic-appearing.  HENT:     Head: Normocephalic and atraumatic.     Nose: Congestion present.     Mouth/Throat:     Mouth: Mucous membranes are moist.     Pharynx: Oropharynx is clear. Posterior oropharyngeal erythema (mild with injection and clear PND) present.  Eyes:     General: No scleral icterus.       Right eye: No discharge.        Left eye: No discharge.     Conjunctiva/sclera:  Conjunctivae normal.  Cardiovascular:     Rate and Rhythm: Normal rate and regular rhythm.     Heart sounds: Normal heart sounds.  Pulmonary:  Effort: Pulmonary effort is normal. No respiratory distress.     Breath sounds: Normal breath sounds. No wheezing, rhonchi or rales.  Musculoskeletal:     Cervical back: Neck supple.  Skin:    General: Skin is dry.  Neurological:     General: No focal deficit present.     Mental Status: She is alert. Mental status is at baseline.     Motor: No weakness.     Gait: Gait normal.  Psychiatric:        Mood and Affect: Mood normal.        Behavior: Behavior normal.        Thought Content: Thought content normal.     UC Treatments / Results  Labs (all labs ordered are listed, but only abnormal results are displayed) Labs Reviewed  GROUP A STREP BY PCR    EKG   Radiology No results found.  Procedures Procedures (including critical care time)  Medications Ordered in UC Medications - No data to display  Initial Impression / Assessment and Plan / UC Course  I have reviewed the triage vital signs and the nursing notes.  Pertinent labs & imaging results that were available during my care of the patient were reviewed by me and considered in my medical decision making (see chart for details).  51 year old female returning for COVID symptoms.  Patient tested +6 days ago and has been symptomatic for 8 days.  She has been taking the Molnupiravir antiviral medication as prescribed and over-the-counter cough medicines.  Reports not feeling up to going back to work due to still feeling fatigued and having the cough.  Vitals all normal and stable.  Patient ill-appearing but nontoxic.  On exam she does have nasal congestion, posterior pharyngeal injection and erythema that is mild with clear postnasal drainage.  Chest clear to auscultation heart regular rate and rhythm.  PCR strep test obtained to rule out secondary infection.  Advised patient  we will contact her if it is positive and start her on antibiotics but likely due to the coughing and postnasal drainage.  Reviewed controlled substance database and find patient to be low risk for abuse so prescribed Cheratussin to help with the cough.  Encouraged her to increase rest and fluids and use her inhaler as prescribed for any shortness of breath.  Reviewed returning or going to ED for return of fever, chest pain, increased breathing difficulty not relieved by use of the inhalers or breathing treatments or any severe or persistent new or worsening symptoms.  Work note provided.   Final Clinical Impressions(s) / UC Diagnoses   Final diagnoses:  COVID-19  Acute cough  Sore throat     Discharge Instructions      -Your symptoms are still due to COVID-19.  You may still continue to be symptomatic for a few more days to a couple of weeks. - Increase rest and fluids. - I sent a cough medicine to pharmacy.  Please help to get some rest.  Continue with the other medications as prescribed previously and you are breathing treatments if needed for any shortness of breath. - Your exam is reassuring today.  However, if you have return of the fever, chest pain or increased breathing trouble you need to be seen again.  Go to emergency department for any severe acute worsening of your symptoms.     ED Prescriptions     Medication Sig Dispense Auth. Provider   guaiFENesin-codeine (CHERATUSSIN AC) 100-10 MG/5ML syrup Take 10 mLs by  mouth 3 (three) times daily as needed for cough. 118 mL Laurene Footman B, PA-C   lidocaine (XYLOCAINE) 2 % solution Use as directed 15 mLs in the mouth or throat every 3 (three) hours as needed for mouth pain (swish and spit). 100 mL Danton Clap, PA-C      I have reviewed the PDMP during this encounter.   Danton Clap, PA-C 04/15/21 1034

## 2021-04-15 NOTE — ED Triage Notes (Signed)
Pt c/o cough. She tested positive for covid 6 days ago. She states she has coughed so much her throat hurts. She states she had a fever yesterday (101) but took tylenol and has not returned.  ?

## 2021-04-15 NOTE — Discharge Instructions (Signed)
-  Your symptoms are still due to COVID-19.  You may still continue to be symptomatic for a few more days to a couple of weeks. ?- Increase rest and fluids. ?- I sent a cough medicine to pharmacy.  Please help to get some rest.  Continue with the other medications as prescribed previously and you are breathing treatments if needed for any shortness of breath. ?- Your exam is reassuring today.  However, if you have return of the fever, chest pain or increased breathing trouble you need to be seen again.  Go to emergency department for any severe acute worsening of your symptoms. ?

## 2021-08-25 ENCOUNTER — Other Ambulatory Visit: Payer: Self-pay | Admitting: *Deleted

## 2021-08-25 DIAGNOSIS — Z853 Personal history of malignant neoplasm of breast: Secondary | ICD-10-CM

## 2021-08-26 ENCOUNTER — Inpatient Hospital Stay (HOSPITAL_BASED_OUTPATIENT_CLINIC_OR_DEPARTMENT_OTHER): Payer: Self-pay | Admitting: Oncology

## 2021-08-26 ENCOUNTER — Inpatient Hospital Stay: Payer: Self-pay | Attending: Oncology

## 2021-08-26 ENCOUNTER — Ambulatory Visit: Payer: 59 | Admitting: Oncology

## 2021-08-26 ENCOUNTER — Other Ambulatory Visit: Payer: 59

## 2021-08-26 ENCOUNTER — Encounter: Payer: Self-pay | Admitting: Oncology

## 2021-08-26 VITALS — BP 135/89 | HR 92 | Temp 97.9°F | Resp 16 | Wt 205.0 lb

## 2021-08-26 DIAGNOSIS — Z853 Personal history of malignant neoplasm of breast: Secondary | ICD-10-CM | POA: Insufficient documentation

## 2021-08-26 DIAGNOSIS — Z87891 Personal history of nicotine dependence: Secondary | ICD-10-CM | POA: Insufficient documentation

## 2021-08-26 DIAGNOSIS — Z803 Family history of malignant neoplasm of breast: Secondary | ICD-10-CM | POA: Insufficient documentation

## 2021-08-26 DIAGNOSIS — Z08 Encounter for follow-up examination after completed treatment for malignant neoplasm: Secondary | ICD-10-CM

## 2021-08-26 DIAGNOSIS — Z809 Family history of malignant neoplasm, unspecified: Secondary | ICD-10-CM | POA: Insufficient documentation

## 2021-08-26 DIAGNOSIS — Z823 Family history of stroke: Secondary | ICD-10-CM | POA: Insufficient documentation

## 2021-08-26 DIAGNOSIS — Z888 Allergy status to other drugs, medicaments and biological substances status: Secondary | ICD-10-CM | POA: Insufficient documentation

## 2021-08-26 DIAGNOSIS — Z79899 Other long term (current) drug therapy: Secondary | ICD-10-CM | POA: Insufficient documentation

## 2021-08-26 DIAGNOSIS — Z9013 Acquired absence of bilateral breasts and nipples: Secondary | ICD-10-CM | POA: Insufficient documentation

## 2021-08-26 DIAGNOSIS — Z885 Allergy status to narcotic agent status: Secondary | ICD-10-CM | POA: Insufficient documentation

## 2021-08-26 LAB — CBC WITH DIFFERENTIAL/PLATELET
Abs Immature Granulocytes: 0.04 10*3/uL (ref 0.00–0.07)
Basophils Absolute: 0 10*3/uL (ref 0.0–0.1)
Basophils Relative: 0 %
Eosinophils Absolute: 0.2 10*3/uL (ref 0.0–0.5)
Eosinophils Relative: 2 %
HCT: 39.2 % (ref 36.0–46.0)
Hemoglobin: 12.9 g/dL (ref 12.0–15.0)
Immature Granulocytes: 0 %
Lymphocytes Relative: 30 %
Lymphs Abs: 3 10*3/uL (ref 0.7–4.0)
MCH: 27.4 pg (ref 26.0–34.0)
MCHC: 32.9 g/dL (ref 30.0–36.0)
MCV: 83.4 fL (ref 80.0–100.0)
Monocytes Absolute: 0.7 10*3/uL (ref 0.1–1.0)
Monocytes Relative: 7 %
Neutro Abs: 6.1 10*3/uL (ref 1.7–7.7)
Neutrophils Relative %: 61 %
Platelets: 294 10*3/uL (ref 150–400)
RBC: 4.7 MIL/uL (ref 3.87–5.11)
RDW: 14.5 % (ref 11.5–15.5)
WBC: 10 10*3/uL (ref 4.0–10.5)
nRBC: 0 % (ref 0.0–0.2)

## 2021-08-26 LAB — COMPREHENSIVE METABOLIC PANEL
ALT: 26 U/L (ref 0–44)
AST: 25 U/L (ref 15–41)
Albumin: 3.8 g/dL (ref 3.5–5.0)
Alkaline Phosphatase: 78 U/L (ref 38–126)
Anion gap: 7 (ref 5–15)
BUN: 12 mg/dL (ref 6–20)
CO2: 24 mmol/L (ref 22–32)
Calcium: 9.2 mg/dL (ref 8.9–10.3)
Chloride: 107 mmol/L (ref 98–111)
Creatinine, Ser: 0.96 mg/dL (ref 0.44–1.00)
GFR, Estimated: >60 mL/min (ref >60)
Glucose, Bld: 110 mg/dL — ABNORMAL HIGH (ref 70–99)
Potassium: 3.8 mmol/L (ref 3.5–5.1)
Sodium: 138 mmol/L (ref 135–145)
Total Bilirubin: 0.5 mg/dL (ref 0.3–1.2)
Total Protein: 7.7 g/dL (ref 6.5–8.1)

## 2021-08-26 MED ORDER — GABAPENTIN 300 MG PO CAPS: ORAL_CAPSULE | ORAL | 0 refills | Status: DC

## 2021-08-26 NOTE — Progress Notes (Signed)
  Pt states it is difficult for her to sleep on her  left side due to soreness and at times causes difficultly to breathe.

## 2021-08-26 NOTE — Progress Notes (Signed)
Hematology/Oncology Consult note [facility]  [phone] [phone]  Patient Care Team: Juline Patch, MD as PCP - General (Family Medicine) Lucilla Lame, MD as Consulting Physician (Gastroenterology) Sindy Guadeloupe, MD as Consulting Physician (Oncology)   Name of the patient: Jill Walsh  [id number]  01/12/1971   Date of visit: 08/26/21  Diagnosis-history of breast cancer in 2016  Chief complaint/ Reason for visit-routine follow-up of breast cancer  Heme/Onc history: Patient is a 51 year old female formally a patient of Dr. Mike Gip and now transferring her care to me.  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  Pathologic stage was T1 MI NX.  She did not receive any adjuvant treatment such as chemotherapy or radiation.  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 Lysle Pearl.  She underwent left mastectomy in April 2022.  Interval history-patient states that she is still has left chest wall pain after her mastectomy.  States that she was told that probably her chest wall was sewn too tightly and that makes her feel tight in her chest wall as well as with her left upper extremity movements.  States that pain is almost every day and limits her from her day-to-day activities  ECOG PS- 0 Pain scale- 3   Review of systems- Review of Systems  Constitutional:  Negative for chills, fever, malaise/fatigue and weight loss.  HENT:  Negative for congestion, ear discharge and nosebleeds.   Eyes:  Negative for blurred vision.  Respiratory:  Negative for cough, hemoptysis, sputum production, shortness of breath and wheezing.   Cardiovascular:  Negative for chest pain, palpitations, orthopnea and claudication.  Gastrointestinal:  Negative for abdominal pain, blood in stool, constipation, diarrhea, heartburn, melena, nausea  and vomiting.  Genitourinary:  Negative for dysuria, flank pain, frequency, hematuria and urgency.  Musculoskeletal:  Negative for back pain, joint pain and myalgias.       Left chest wall pain  Skin:  Negative for rash.  Neurological:  Negative for dizziness, tingling, focal weakness, seizures, weakness and headaches.  Endo/Heme/Allergies:  Does not bruise/bleed easily.  Psychiatric/Behavioral:  Negative for depression and suicidal ideas. The patient does not have insomnia.       Allergies  Allergen Reactions   Tramadol Other (See Comments)    Real bad headache     Past Medical History:  Diagnosis Date   Asthma    Breast cancer ([place]) 2016   RT MASTECTOMY     Past Surgical History:  Procedure Laterality Date   BREAST BIOPSY Left    02/28/14 negative   BREAST BIOPSY Right     02/2014 malignant   COLONOSCOPY WITH PROPOFOL N/A 11/13/2018   Procedure: COLONOSCOPY WITH PROPOFOL;  Surgeon: Jonathon Bellows, MD;  Location: [facility] ENDOSCOPY;  Service: Gastroenterology;  Laterality: N/A;   MASTECTOMY Right 2016   BREAST CA   TOTAL MASTECTOMY Left 05/29/2020   Procedure: TOTAL MASTECTOMY;  Surgeon: Benjamine Sprague, DO;  Location: [facility] ORS;  Service: General;  Laterality: Left;   TUBAL LIGATION      Social History   Socioeconomic History   Marital status: Single    Spouse name: Not on file   Number of children: Not on file   Years of education: Not on file   Highest education level: Not on file  Occupational History   Not on file  Tobacco Use   Smoking  status: Former    Years: 15.00    Types: Cigarettes, E-cigarettes    Quit date: 05/23/2014    Years since quitting: 7.2   Smokeless tobacco: Former  Scientific laboratory technician Use: Former  Substance and Sexual Activity   Alcohol use: No   Drug use: No   Sexual activity: Not on file  Other Topics Concern   Not on file  Social History Narrative   Not on file   Social Determinants of Health   Financial Resource Strain: Not on file   Food Insecurity: Unknown (08/18/2017)   Hunger Vital Sign    Worried About Running Out of Food in the Last Year: Patient refused    [place] in the Last Year: Patient refused  Transportation Needs: Unknown (08/18/2017)   PRAPARE - Transportation    Lack of Transportation (Medical): Patient refused    Lack of Transportation (Non-Medical): Patient refused  Physical Activity: Unknown (08/18/2017)   Exercise Vital Sign    Days of Exercise per Week: Patient refused    Minutes of Exercise per Session: Patient refused  Stress: Not on file  Social Connections: Unknown (08/18/2017)   Social Connection and Isolation Panel [NHANES]    Frequency of Communication with Friends and Family: Patient refused    Frequency of Social Gatherings with Friends and Family: Patient refused    Attends Religious Services: Patient refused    Active Member of Clubs or Organizations: Patient refused    Attends Archivist Meetings: Patient refused    Marital Status: Patient refused  Intimate Partner Violence: Unknown (08/18/2017)   Humiliation, Afraid, Rape, and Kick questionnaire    Fear of Current or Ex-Partner: Patient refused    Emotionally Abused: Patient refused    Physically Abused: Patient refused    Sexually Abused: Patient refused    Family History  Problem Relation Age of Onset   Cancer Mother    Breast cancer Mother 41   Stroke Brother    Cancer Maternal Aunt      Current Outpatient Medications:    gabapentin (NEURONTIN) 300 MG capsule, Take 1 capsule (300 mg total) by mouth daily for 4 days, THEN 1 capsule (300 mg total) 2 (two) times daily for 4 days, THEN 1 capsule (300 mg total) 3 (three) times daily., Disp: 102 capsule, Rfl: 0   ibuprofen (ADVIL) 200 MG tablet, Take 200 mg by mouth every 6 (six) hours as needed., Disp: , Rfl:    hydrOXYzine (ATARAX/VISTARIL) 25 MG tablet, Take 1 tablet (25 mg total) by mouth every 6 (six) hours as needed for itching. (Patient not taking:  Reported on 08/26/2021), Disp: 20 tablet, Rfl: 0  Physical exam:  Vitals:   08/26/21 0941  BP: 135/89  Pulse: 92  Resp: 16  Temp: 97.9 F (36.6 C)  SpO2: 99%  Weight: 205 lb (93 kg)   Physical Exam Cardiovascular:     Rate and Rhythm: Normal rate and regular rhythm.     Heart sounds: Normal heart sounds.  Pulmonary:     Effort: Pulmonary effort is normal.     Breath sounds: Normal breath sounds.  Skin:    General: Skin is warm and dry.  Neurological:     Mental Status: She is alert and oriented to person, place, and time.   Chest wall exam: Patient is s/p bilateral mastectomy without reconstruction.  There is more skin laxity on the right side than left side.  There is no evidence of chest wall  recurrence.  No palpable bilateral axillary adenopathy.     Latest Ref Rng & Units 08/26/2021    9:13 AM  CMP  Glucose 70 - 99 mg/dL 110   BUN 6 - 20 mg/dL 12   Creatinine 0.44 - 1.00 mg/dL 0.96   Sodium 135 - 145 mmol/L 138   Potassium 3.5 - 5.1 mmol/L 3.8   Chloride 98 - 111 mmol/L 107   CO2 22 - 32 mmol/L 24   Calcium 8.9 - 10.3 mg/dL 9.2   Total Protein 6.5 - 8.1 g/dL 7.7   Total Bilirubin 0.3 - 1.2 mg/dL 0.5   Alkaline Phos 38 - 126 U/L 78   AST 15 - 41 U/L 25   ALT 0 - 44 U/L 26       Latest Ref Rng & Units 08/26/2021    9:13 AM  CBC  WBC 4.0 - 10.5 K/uL 10.0   Hemoglobin 12.0 - 15.0 g/dL 12.9   Hematocrit 36.0 - 46.0 % 39.2   Platelets 150 - 400 K/uL 294      Assessment and plan- Patient is a 51 y.o. 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  She then underwent prophylactic left mastectomy in 2022 and a routine follow-up visit  From a breast cancer standpoint she is now 5 years out of her breast cancer diagnosis.  She has had a bilateral mastectomy and therefore no role for mammograms.  No role for any routine tumor marker testing.  As such patient  does not require any oncology follow-up for her breast cancer.  Patient currently reports left chest wall pain which she attributes it to skin tightness postsurgery.  There could also be an element of postmastectomy pain.  Have started her on gabapentin 300 mg at night and she will slowly increase it to 300 mg 3 times a day over the next 1 week.  If she has any problems tolerating gabapentin she will let us know.  I will have her see NP Beckey Rutter in 3 months to see how she is doing with her pain control.  She would not qualify for any narcotics.  As such if her symptoms are better with gabapentin she can stay on it and continue to follow-up with her primary care doctor after that.   Visit Diagnosis 1. Encounter for follow-up surveillance of breast cancer      Dr. Randa Evens, MD, MPH [facility] [phone] 08/26/2021 12:52 PM

## 2021-10-27 ENCOUNTER — Inpatient Hospital Stay: Payer: Self-pay | Admitting: Nurse Practitioner

## 2021-10-28 ENCOUNTER — Inpatient Hospital Stay: Payer: Self-pay | Attending: Nurse Practitioner | Admitting: Nurse Practitioner

## 2021-10-28 DIAGNOSIS — M792 Neuralgia and neuritis, unspecified: Secondary | ICD-10-CM

## 2021-10-28 MED ORDER — DULOXETINE HCL 30 MG PO CPEP: ORAL_CAPSULE | ORAL | 0 refills | Status: DC

## 2021-10-28 MED ORDER — GABAPENTIN 300 MG PO CAPS: 600.0000 mg | ORAL_CAPSULE | Freq: Every day | ORAL | 0 refills | Status: DC

## 2021-10-28 NOTE — Progress Notes (Signed)
Hematology/Oncology Consult Note [facility]  [phone] [phone]  Virtual Visit Progress Note  I connected with Izetta Dakin on 10/28/21 at  1:00 PM EDT by video enabled telemedicine visit and verified that I am speaking with the correct person using two identifiers.   I discussed the limitations, risks, security and privacy concerns of performing an evaluation and management service by telemedicine and the availability of in-person appointments. I also discussed with the patient that there may be a patient responsible charge related to this service. The patient expressed understanding and agreed to proceed.   Other persons participating in the visit and their role in the encounter: none   Patient's location: car  Provider's location: clinic   Chief Complaint: neuropathic pain  Patient Care Team: Juline Patch, MD as PCP - General (Family Medicine) Lucilla Lame, MD as Consulting Physician (Gastroenterology) Sindy Guadeloupe, MD as Consulting Physician (Oncology)   Name of the patient: Rula Keniston  [id number]  1970-05-20   Date of visit: 10/28/21  Diagnosis-history of breast cancer in 2016  Chief complaint/ Reason for visit-routine follow-up of breast cancer  Heme/Onc history: Patient is a 51 year old female formally a patient of Dr. Mike Gip and now transferring her care to me.  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  Pathologic stage was T1 MI NX.  She did not receive any adjuvant treatment such as chemotherapy or radiation.  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 Lysle Pearl.  She underwent left mastectomy in April 2022.  Interval history- Persistent chest wall pain most mastectomy. Taking gabapentin at night. Unable to tolerate during the day. No relief with tylenol or ibuprofen. Would like other  medication to try as this limits her mobility and impairs quality of life and sleep.   ECOG PS- 0 Pain scale- 3  Review of systems- Review of Systems  Constitutional:  Negative for chills, fever, malaise/fatigue and weight loss.  HENT:  Negative for congestion, ear discharge and nosebleeds.   Eyes:  Negative for blurred vision.  Respiratory:  Negative for cough, hemoptysis, sputum production, shortness of breath and wheezing.   Cardiovascular:  Negative for chest pain, palpitations, orthopnea and claudication.  Gastrointestinal:  Negative for abdominal pain, blood in stool, constipation, diarrhea, heartburn, melena, nausea and vomiting.  Genitourinary:  Negative for dysuria, flank pain, frequency, hematuria and urgency.  Musculoskeletal:  Negative for back pain, joint pain and myalgias.       Left chest wall pain  Skin:  Negative for rash.  Neurological:  Negative for dizziness, tingling, focal weakness, seizures, weakness and headaches.  Endo/Heme/Allergies:  Does not bruise/bleed easily.  Psychiatric/Behavioral:  Negative for depression and suicidal ideas. The patient does not have insomnia.       Allergies  Allergen Reactions   Tramadol Other (See Comments)    Real bad headache     Past Medical History:  Diagnosis Date   Asthma    Breast cancer ([place]) 2016   RT MASTECTOMY     Past Surgical History:  Procedure Laterality Date   BREAST BIOPSY Left    02/28/14 negative   BREAST BIOPSY Right     02/2014 malignant   COLONOSCOPY WITH PROPOFOL N/A 11/13/2018   Procedure: COLONOSCOPY WITH PROPOFOL;  Surgeon: Jonathon Bellows, MD;  Location: [facility] ENDOSCOPY;  Service: Gastroenterology;  Laterality: N/A;   MASTECTOMY Right 2016  BREAST CA   TOTAL MASTECTOMY Left 05/29/2020   Procedure: TOTAL MASTECTOMY;  Surgeon: Sakai, Isami, DO;  Location: [facility] ORS;  Service: General;  Laterality: Left;   TUBAL LIGATION      Social History   Socioeconomic History   Marital status: Single     Spouse name: Not on file   Number of children: Not on file   Years of education: Not on file   Highest education level: Not on file  Occupational History   Not on file  Tobacco Use   Smoking status: Former    Years: 15.00    Types: Cigarettes, E-cigarettes    Quit date: 05/23/2014    Years since quitting: 7.4   Smokeless tobacco: Former  Vaping Use   Vaping Use: Former  Substance and Sexual Activity   Alcohol use: No   Drug use: No   Sexual activity: Not on file  Other Topics Concern   Not on file  Social History Narrative   Not on file   Social Determinants of Health   Financial Resource Strain: Not on file  Food Insecurity: Unknown (08/18/2017)   Hunger Vital Sign    Worried About Running Out of Food in the Last Year: Patient refused    Ran Out of Food in the Last Year: Patient refused  Transportation Needs: Unknown (08/18/2017)   PRAPARE - Transportation    Lack of Transportation (Medical): Patient refused    Lack of Transportation (Non-Medical): Patient refused  Physical Activity: Unknown (08/18/2017)   Exercise Vital Sign    Days of Exercise per Week: Patient refused    Minutes of Exercise per Session: Patient refused  Stress: Not on file  Social Connections: Unknown (08/18/2017)   Social Connection and Isolation Panel [NHANES]    Frequency of Communication with Friends and Family: Patient refused    Frequency of Social Gatherings with Friends and Family: Patient refused    Attends Religious Services: Patient refused    Active Member of Clubs or Organizations: Patient refused    Attends Club or Organization Meetings: Patient refused    Marital Status: Patient refused  Intimate Partner Violence: Unknown (08/18/2017)   Humiliation, Afraid, Rape, and Kick questionnaire    Fear of Current or Ex-Partner: Patient refused    Emotionally Abused: Patient refused    Physically Abused: Patient refused    Sexually Abused: Patient refused    Family History  Problem Relation  Age of Onset   Cancer Mother    Breast cancer Mother 62   Stroke Brother    Cancer Maternal Aunt      Current Outpatient Medications:    gabapentin (NEURONTIN) 300 MG capsule, Take 1 capsule (300 mg total) by mouth daily for 4 days, THEN 1 capsule (300 mg total) 2 (two) times daily for 4 days, THEN 1 capsule (300 mg total) 3 (three) times daily., Disp: 102 capsule, Rfl: 0   hydrOXYzine (ATARAX/VISTARIL) 25 MG tablet, Take 1 tablet (25 mg total) by mouth every 6 (six) hours as needed for itching. (Patient not taking: Reported on 08/26/2021), Disp: 20 tablet, Rfl: 0   ibuprofen (ADVIL) 200 MG tablet, Take 200 mg by mouth every 6 (six) hours as needed., Disp: , Rfl:   Physical exam:  There were no vitals filed for this visit.  Physical Exam Constitutional:      General: She is not in acute distress. HENT:     Head: Normocephalic.  Pulmonary:     Effort: No respiratory distress.  Neurological:       Mental Status: She is alert and oriented to person, place, and time.  Psychiatric:        Mood and Affect: Mood normal.        Behavior: Behavior normal.   Chest wall exam: virtual  Assessment and plan- Patient is a 51 y.o. 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  She then underwent prophylactic left mastectomy in 2022. She is > 5 years from her diagnosis and given bilateral mastectomy there is no role for imaging or tumor marker testing. She continues surveillance with her pcp.  Post surgical neuropathic pain- d/t mastectomy. Improved with gabapentin 500 mg at night but unable to tolerate during the day d/t drowsiness. No relief with otc ibuprofen or tylenol. Start duloxetine 300 mg daily then increase to more therapeutic dose of 60 mg daily dose after 2 weeks if tolerating well. We discussed the mechanism of action of this medication and possible side effects  including risk of suicidality. Will see her back in 6 weeks to assess response. Once she is stable, she can be released to pcp for continuation.   Disposition:  6 weeks- virtual visit with me to follow up on post-surgical neuropathic pain- la   I discussed the assessment and treatment plan with the patient. The patient was provided an opportunity to ask questions and all were answered. The patient agreed with the plan and demonstrated an understanding of the instructions.   The patient was advised to call back or seek an in-person evaluation if the symptoms worsen or if the condition fails to improve as anticipated.   I spent 20 minutes face-to-face video visit time dedicated to the care of this patient on the date of this encounter to include pre-visit review of <specify which records>, face-to-face time with the patient, and post visit ordering of testing/documentation.   Visit Diagnosis 1. Neuropathic pain of chest    Lauren Allen, DNP, AGNP-C [facility] [phone] (clinic) 10/28/2021   

## 2021-10-29 ENCOUNTER — Encounter: Payer: Self-pay | Admitting: Nurse Practitioner

## 2021-10-29 ENCOUNTER — Other Ambulatory Visit: Payer: Self-pay | Admitting: *Deleted

## 2021-10-30 ENCOUNTER — Other Ambulatory Visit: Payer: Self-pay

## 2021-10-30 ENCOUNTER — Inpatient Hospital Stay: Payer: Self-pay | Admitting: Licensed Clinical Social Worker

## 2021-10-30 DIAGNOSIS — C50011 Malignant neoplasm of nipple and areola, right female breast: Secondary | ICD-10-CM

## 2021-10-30 DIAGNOSIS — M792 Neuralgia and neuritis, unspecified: Secondary | ICD-10-CM

## 2021-10-30 NOTE — Progress Notes (Signed)
[facility] Work  Initial Assessment   Jill Walsh is a 51 y.o. year old female contacted by phone. Clinical Social Work was referred by medical provider for assessment of psychosocial needs.   SDOH (Social Determinants of Health) assessments performed: Yes SDOH Interventions    Flowsheet Row Clinical Support from 10/30/2021 in [facility] [place] Interventions   Food Insecurity Interventions Intervention Not Indicated  Housing Interventions Intervention Not Indicated  Transportation Interventions Intervention Not Indicated  Utilities Interventions Intervention Not Indicated  Alcohol Usage Interventions Intervention Not Indicated (Score <7)  Financial Strain Interventions Intervention Not Indicated  Physical Activity Interventions Intervention Not Indicated  Stress Interventions Intervention Not Indicated  Social Connections Interventions Intervention Not Indicated       SDOH Screenings   Food Insecurity: No Food Insecurity (10/30/2021)  Housing: Low Risk  (10/30/2021)  Transportation Needs: No Transportation Needs (10/30/2021)  Utilities: Not At Risk (10/30/2021)  Depression (PHQ2-9): Low Risk  (01/24/2019)  Financial Resource Strain: Medium Risk (10/30/2021)  Physical Activity: Inactive (10/30/2021)  Social Connections: Socially Integrated (10/30/2021)  Stress: Stress Concern Present (10/30/2021)  Tobacco Use: Medium Risk (08/26/2021)     Distress Screen completed: No    07/14/2015    2:59 PM  ONCBCN DISTRESS SCREENING  Screening Type Initial Screening  Distress experienced in past week (1-10) 0      Family/Social Information:  Housing Arrangement: patient lives with spouse Riley,[place]  [phone]  Family members/support persons in your life? Family, Colleagues, and Medical Staff Transportation concerns: no  Employment: Working full time , patient is currently uninsured, she has started a new position at [employer] and insurance  will not be active until January 2024. Income source: Employment Financial concerns: Yes, due to illness and/or loss of work during treatment Type of concern: Medical bills Food access concerns: no Religious or spiritual practice: Not known Services Currently in place:  N/A  Coping/ Adjustment to diagnosis: Patient understands treatment plan and what happens next? yes Concerns about diagnosis and/or treatment: Pain or discomfort during procedures, Feelings of anger or sadness, Body image, Afraid of cancer, How I will pay for the services I need, and Quality of life Patient reported stressors: Finances, Depression, Anxiety/ nervousness, Adjusting to my illness, Isolation/ feeling alone, Feeling hopeless, and Physical issues Hopes and/or priorities: N/A Patient enjoys  N/A Current coping skills/ strengths: Average or above average intelligence , Capable of independent living , Communication skills , General fund of knowledge , and Work skills     SUMMARY: Current SDOH Barriers:  Financial constraints related to currently uninsured, Limited social support, ADL IADL limitations, Mental Health Concerns , and Limited education about post-mastectomy physical limitations and pain*  Clinical Social Work Clinical Goal(s):  Patient will work with SW to address concerns related to anxiety, stressors, depression and adjustment concerns Patient will follow up with [facility] and Medication management to discuss application criteria* as directed by SW  Interventions: Discussed common feeling and emotions when being diagnosed with cancer, and the importance of support during treatment Informed patient of the support team roles and support services at [facility] Provided CSW contact information and encouraged patient to call with any questions or concerns Referred patient to [facility] and Designer, jewellery and Provided patient with information about CSW role in patient care and other available  resources, as well as, calendar information/events and groups.  CSW will email information to [email or web address]   Follow Up Plan: CSW will see patient on Sept  26 @ 11:30 AM Patient verbalizes understanding of plan: Yes    [employer], LCSW

## 2021-10-30 NOTE — Telephone Encounter (Signed)
I entered the social work order. Maurice Small, Will you be able to go over the application process with her? Thanks

## 2021-11-03 ENCOUNTER — Inpatient Hospital Stay: Payer: Self-pay | Admitting: Licensed Clinical Social Worker

## 2021-11-09 ENCOUNTER — Inpatient Hospital Stay: Payer: Self-pay | Attending: Nurse Practitioner | Admitting: Licensed Clinical Social Worker

## 2021-11-09 DIAGNOSIS — C50011 Malignant neoplasm of nipple and areola, right female breast: Secondary | ICD-10-CM

## 2021-11-09 NOTE — Progress Notes (Signed)
[place] CSW Counseling Note  Patient was referred by medical provider. Treatment type: Individual  Presenting Concerns: Patient and/or family reports the following symptoms/concerns: anxiety, depression, stress, and adjustment to treatment plan  Duration of problem: 1 years; Severity of problem: moderate   Orientation:oriented to person, place, time/date, situation, day of week, month of year, and year.   Affect: Depressed and Tearful Risk of harm to self or others: No plan to harm self or others  Patient and/or Family's Strengths/Protective Factors: Social connections, Concrete supports in place (healthy food, safe environments, etc.), and Physical Health (exercise, healthy diet, medication compliance, etc.)Active sense of humor  Average or above average intelligence  Capable of independent living  Electronics engineer  Motivation for treatment/growth  Supportive family/friends  Work skills      Goals Addressed: Patient will:  Reduce symptoms of: anxiety, depression, stress, and adjustment concerns Increase knowledge and/or ability of: coping skills, healthy habits, and stress reduction  Increase healthy adjustment to current life circumstances, Begin healthy grieving over loss, and  boundary setting     Progress towards Goals: Initial   Interventions: Interventions utilized:  CBT, Solution Focused, Strength-based, Supportive, and Reframing      Assessment: Patient currently experiencing increased anxiety, depression and stress, due to experience after surgery.  Patient is experiencing adjustment and acceptance difficulties as well increased anxiety. Dicussed different skills to assist with adjustment and anxiety, including mindfulness and ACT      Plan: Follow up with CSW: 2 weeks Behavioral recommendations: continue using mindfulness skills to help with emotional regulation. Referral(s): Support group(s):  Breast Cancer Support group         [employer], LCSW

## 2021-11-16 ENCOUNTER — Other Ambulatory Visit: Payer: Self-pay | Admitting: *Deleted

## 2021-11-16 NOTE — Telephone Encounter (Signed)
Incoming [facility] pharmacy request for RF - cymbalta 60 mg daily

## 2021-11-17 MED ORDER — DULOXETINE HCL 60 MG PO CPEP: 60.0000 mg | ORAL_CAPSULE | Freq: Every day | ORAL | 1 refills | Status: DC

## 2021-11-23 ENCOUNTER — Inpatient Hospital Stay: Payer: Self-pay | Admitting: Licensed Clinical Social Worker

## 2021-11-23 DIAGNOSIS — C50011 Malignant neoplasm of nipple and areola, right female breast: Secondary | ICD-10-CM

## 2021-11-23 NOTE — Progress Notes (Signed)
[place] CSW Counseling Note  Patient was referred by medical provider. Treatment type: Individual  Presenting Concerns: Patient and/or family reports the following symptoms/concerns: anxiety, depression, stress, and post-op physical adjustment Duration of problem: 1 years; Severity of problem: moderate   Orientation:oriented to person, place, time/date, situation, day of week, month of year, and year.   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  Supportive family/friends  Work skills      Goals Addressed: Patient will:  Reduce symptoms of: anxiety, depression, stress, and adjustment Increase knowledge and/or ability of: coping skills, healthy habits, and stress reduction  Increase healthy adjustment to current life circumstances, Increase adequate support systems for patient/family, and Begin healthy grieving over loss   Progress towards Goals: Progressing   Interventions: Interventions utilized:  CBT, Strength-based, Supportive, Reframing, and Other: ACT       Assessment: Patient currently experiencing  increased anxiety, depression and stress, due to experience post-surgery. Patient is experiencing adjustment and acceptance difficulties as well increased anxiety. Dicussed different skills to assist with adjustment and anxiety, including mindfulness and ACT.      Plan: Follow up with CSW: 2 weeks Behavioral recommendations: continue using mindfulness skills to help with emotional regulation. Referral(s): Support group(s):  Breast Cancer Support Group         Adelene Amas, LCSW

## 2021-12-08 ENCOUNTER — Inpatient Hospital Stay: Payer: Self-pay | Admitting: Licensed Clinical Social Worker

## 2021-12-11 ENCOUNTER — Inpatient Hospital Stay: Payer: Self-pay | Admitting: Nurse Practitioner

## 2021-12-21 ENCOUNTER — Inpatient Hospital Stay: Payer: Self-pay | Admitting: Nurse Practitioner

## 2021-12-28 ENCOUNTER — Inpatient Hospital Stay: Payer: Self-pay | Attending: Nurse Practitioner | Admitting: Nurse Practitioner

## 2021-12-28 ENCOUNTER — Encounter: Payer: Self-pay | Admitting: Nurse Practitioner

## 2021-12-28 DIAGNOSIS — Z853 Personal history of malignant neoplasm of breast: Secondary | ICD-10-CM

## 2021-12-28 DIAGNOSIS — M792 Neuralgia and neuritis, unspecified: Secondary | ICD-10-CM

## 2021-12-28 DIAGNOSIS — Z9012 Acquired absence of left breast and nipple: Secondary | ICD-10-CM

## 2021-12-28 MED ORDER — DULOXETINE HCL 60 MG PO CPEP: 60.0000 mg | ORAL_CAPSULE | Freq: Every day | ORAL | 1 refills | Status: DC

## 2021-12-28 NOTE — Progress Notes (Signed)
Hematology/Oncology Consult Note [facility]  [phone] [phone]  Virtual Visit Progress Note  I connected with Izetta Dakin on 12/28/21 at  11:00 AM EDT by video enabled telemedicine visit and verified that I am speaking with the correct person using two identifiers.   I discussed the limitations, risks, security and privacy concerns of performing an evaluation and management service by telemedicine and the availability of in-person appointments. I also discussed with the patient that there may be a patient responsible charge related to this service. The patient expressed understanding and agreed to proceed.   Other persons participating in the visit and their role in the encounter: none   Patient's location: home Provider's location: clinic   Chief Complaint: neuropathic pain  Patient Care Team: Juline Patch, MD as PCP - General (Family Medicine) Lucilla Lame, MD as Consulting Physician (Gastroenterology) Sindy Guadeloupe, MD as Consulting Physician (Oncology)   Name of the patient: Jill Walsh  [id number]  [birth year]   Date of visit: 12/28/21  Diagnosis-history of breast cancer in 2016  Chief complaint/ Reason for visit- post surgical chronic pain  Heme/Onc history: Patient is a 51 year old female formally a patient of Dr. Mike Gip and now transferring her care to me.  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  Pathologic stage was T1 MI NX.  She did not receive any adjuvant treatment such as chemotherapy or radiation.  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 Lysle Pearl.  She underwent left mastectomy in April 2022.  Interval history- Persistent chest wall pain most mastectomy. Continues gabapentin at night. Has not been able to start duloxetine d/t cost/self-pay. Pain is unchanged.   ECOG PS-  0 Pain scale- 3  Review of systems- Review of Systems  Constitutional:  Negative for chills, fever, malaise/fatigue and weight loss.  HENT:  Negative for congestion, ear discharge and nosebleeds.   Eyes:  Negative for blurred vision.  Respiratory:  Negative for cough, hemoptysis, sputum production, shortness of breath and wheezing.   Cardiovascular:  Negative for chest pain, palpitations, orthopnea and claudication.  Gastrointestinal:  Negative for abdominal pain, blood in stool, constipation, diarrhea, heartburn, melena, nausea and vomiting.  Genitourinary:  Negative for dysuria, flank pain, frequency, hematuria and urgency.  Musculoskeletal:  Negative for back pain, joint pain and myalgias.       Left chest wall pain  Skin:  Negative for rash.  Neurological:  Negative for dizziness, tingling, focal weakness, seizures, weakness and headaches.  Endo/Heme/Allergies:  Does not bruise/bleed easily.  Psychiatric/Behavioral:  Negative for depression and suicidal ideas. The patient does not have insomnia.       Allergies  Allergen Reactions   Tramadol Other (See Comments)    Real bad headache     Past Medical History:  Diagnosis Date   Asthma    Breast cancer ([place]) 2016   RT MASTECTOMY     Past Surgical History:  Procedure Laterality Date   BREAST BIOPSY Left    02/28/14 negative   BREAST BIOPSY Right     02/2014 malignant   COLONOSCOPY WITH PROPOFOL N/A 11/13/2018   Procedure: COLONOSCOPY WITH PROPOFOL;  Surgeon: Jonathon Bellows, MD;  Location: [facility] ENDOSCOPY;  Service: Gastroenterology;  Laterality: N/A;   MASTECTOMY Right 2016   BREAST CA   TOTAL MASTECTOMY Left 05/29/2020   Procedure: TOTAL MASTECTOMY;  Surgeon: Benjamine Sprague, DO;  Location: [facility] ORS;  Service: General;  Laterality: Left;   TUBAL LIGATION      Social History   Socioeconomic History   Marital status: Single    Spouse name: Not on file   Number of children: Not on file   Years of education: Not on file    Highest education level: Not on file  Occupational History   Not on file  Tobacco Use   Smoking status: Former    Years: 15.00    Types: Cigarettes, E-cigarettes    Quit date: 05/23/2014    Years since quitting: 7.6   Smokeless tobacco: Former  Scientific laboratory technician Use: Former  Substance and Sexual Activity   Alcohol use: No   Drug use: No   Sexual activity: Not on file  Other Topics Concern   Not on file  Social History Narrative   Not on file   Social Determinants of Health   Financial Resource Strain: Medium Risk (10/30/2021)   Overall Financial Resource Strain (CARDIA)    Difficulty of Paying Living Expenses: Somewhat hard  Food Insecurity: No Food Insecurity (10/30/2021)   Hunger Vital Sign    Worried About Running Out of Food in the Last Year: Never true    [place] in the Last Year: Never true  Transportation Needs: No Transportation Needs (10/30/2021)   PRAPARE - Hydrologist (Medical): No    Lack of Transportation (Non-Medical): No  Physical Activity: Inactive (10/30/2021)   Exercise Vital Sign    Days of Exercise per Week: 0 days    Minutes of Exercise per Session: 0 min  Stress: Stress Concern Present (10/30/2021)   [place]    Feeling of Stress : Rather much  Social Connections: Socially Integrated (10/30/2021)   Social Connection and Isolation Panel [NHANES]    Frequency of Communication with Friends and Family: Three times a week    Frequency of Social Gatherings with Friends and Family: Twice a week    Attends Religious Services: 1 to 4 times per year    Active Member of [employer] or Organizations: Yes    Attends Archivist Meetings: 1 to 4 times per year    Marital Status: Married  Human resources officer Violence: Unknown (10/30/2021)   Humiliation, Afraid, Rape, and Kick questionnaire    Fear of Current or Ex-Partner: Patient refused    Emotionally Abused:  Patient refused    Physically Abused: Patient refused    Sexually Abused: Patient refused    Family History  Problem Relation Age of Onset   Cancer Mother    Breast cancer Mother 4   Stroke Brother    Cancer Maternal Aunt      Current Outpatient Medications:    DULoxetine (CYMBALTA) 60 MG capsule, Take 1 capsule (60 mg total) by mouth daily., Disp: 30 capsule, Rfl: 1   gabapentin (NEURONTIN) 300 MG capsule, Take 2 capsules (600 mg total) by mouth at bedtime., Disp: 120 capsule, Rfl: 0   ibuprofen (ADVIL) 200 MG tablet, Take 200 mg by mouth every 6 (six) hours as needed., Disp: , Rfl:    hydrOXYzine (ATARAX/VISTARIL) 25 MG tablet, Take 1 tablet (25 mg total) by mouth every 6 (six) hours as needed for itching. (Patient not taking: Reported on 08/26/2021), Disp: 20 tablet, Rfl: 0  Physical exam:  There were no vitals filed for this visit.  Physical Exam Constitutional:  General: She is not in acute distress. HENT:     Head: Normocephalic.  Pulmonary:     Effort: No respiratory distress.  Neurological:     Mental Status: She is alert and oriented to person, place, and time.  Psychiatric:        Mood and Affect: Mood normal.        Behavior: Behavior normal.   Chest wall exam: virtual  Assessment and plan- Patient is a 51 y.o. 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  She then underwent prophylactic left mastectomy in 2022. She is > 5 years from her diagnosis and given bilateral mastectomy there is no role for imaging or tumor marker testing. She continues surveillance with her pcp.  Post surgical neuropathic pain- d/t mastectomy. Improved with gabapentin 500 mg at night but unable to tolerate during the day d/t drowsiness. No relief with otc ibuprofen or tylenol. Duloxetine was recommended but she has been unable to afrord d/t cost. We reviewed use  of [employer] for prescription assistance. Prescription sent to [facility]. She can follow up with pcp for continuation or rtc as needed.   Disposition:  Rtc as needed.    I discussed the assessment and treatment plan with the patient. The patient was provided an opportunity to ask questions and all were answered. The patient agreed with the plan and demonstrated an understanding of the instructions.   The patient was advised to call back or seek an in-person evaluation if the symptoms worsen or if the condition fails to improve as anticipated.   I spent 20 minutes face-to-face video visit time dedicated to the care of this patient on the date of this encounter to include pre-visit review of previous ntoes, face-to-face time with the patient, and post visit ordering of testing/documentation.   Visit Diagnosis 1. Neuropathic pain    Beckey Rutter, DNP, AGNP-C [place] [facility] [phone] (clinic) 12/28/2021  CC: Dr. Ronnald Ramp

## 2022-01-19 ENCOUNTER — Encounter: Payer: Self-pay | Admitting: Nurse Practitioner

## 2022-01-20 ENCOUNTER — Telehealth: Payer: Self-pay | Admitting: Nurse Practitioner

## 2022-01-20 NOTE — Telephone Encounter (Signed)
Called patient. Left vm to return call.

## 2022-02-09 ENCOUNTER — Ambulatory Visit
Admission: EM | Admit: 2022-02-09 | Discharge: 2022-02-09 | Disposition: A | Discharge status: Home / Self Care | Payer: 59 | Attending: Physician Assistant | Admitting: Physician Assistant

## 2022-02-09 DIAGNOSIS — Z1152 Encounter for screening for COVID-19: Secondary | ICD-10-CM | POA: Insufficient documentation

## 2022-02-09 DIAGNOSIS — J45901 Unspecified asthma with (acute) exacerbation: Secondary | ICD-10-CM | POA: Diagnosis present

## 2022-02-09 DIAGNOSIS — R051 Acute cough: Secondary | ICD-10-CM | POA: Insufficient documentation

## 2022-02-09 DIAGNOSIS — B349 Viral infection, unspecified: Secondary | ICD-10-CM | POA: Insufficient documentation

## 2022-02-09 LAB — SARS CORONAVIRUS 2 BY RT PCR: SARS Coronavirus 2 by RT PCR: NEGATIVE

## 2022-02-09 MED ORDER — CHERATUSSIN AC 100-10 MG/5ML PO SOLN: 10.0000 mL | Freq: Three times a day (TID) | ORAL | 0 refills | Status: DC | PRN

## 2022-02-09 MED ORDER — PREDNISONE 20 MG PO TABS: 40.0000 mg | ORAL_TABLET | Freq: Every day | ORAL | 0 refills | Status: AC

## 2022-02-09 NOTE — ED Triage Notes (Signed)
2-days of cough and congestion. Pt is taking Mucinex with no relief.

## 2022-02-09 NOTE — Discharge Instructions (Signed)
-  You are negative for COVID.  You likely have another viral illness and it is possible it could be the flu since he feels so poorly but you are outside the treatment window for Tamiflu. - I have sent cough medication and prednisone to the pharmacy.  Continue to use your inhaler if you feel short of breath. - Increase your rest and fluids. - If you develop any increase shortness of breath, please return.

## 2022-02-09 NOTE — ED Provider Notes (Signed)
[facility]    CSN: [id number] Arrival date & time: 02/09/22  1012      History   Chief Complaint Chief Complaint  Patient presents with   Cough   Nasal Congestion    HPI Jill Walsh is a 52 y.o. female presenting for 4 day history of cough and congestion.  Reports fever up to 102 degrees.  Reports she is coughing so much that it is causing her throat to hurt.  Mild increase in shortness of breath.  She does have asthma but has used her nebulizer/inhaler if needed and has not needed to use it much.  No nausea/vomiting or diarrhea. She has been taking Mucinex.  Reports her head and was recently sick with similar symptoms but he did not get checked out.  She denies any known exposure to COVID or flu.  No other concerns.  HPI  Past Medical History:  Diagnosis Date   Asthma    Breast cancer ([place]) 2016   RT MASTECTOMY    Patient Active Problem List   Diagnosis Date Noted   Neuropathic pain of chest 10/28/2021   Family history of colon cancer 04/23/2019   Simple chronic bronchitis ([place]) 05/30/2017   Breast pain 12/20/2014   Family history of breast cancer 05/28/2014   Current smoker 05/28/2014   Breast cancer ([place]) 05/28/2014   Breast cancer, right breast ([place]) 03/28/2014    Past Surgical History:  Procedure Laterality Date   BREAST BIOPSY Left    02/28/14 negative   BREAST BIOPSY Right     02/2014 malignant   COLONOSCOPY WITH PROPOFOL N/A 11/13/2018   Procedure: COLONOSCOPY WITH PROPOFOL;  Surgeon: Jonathon Bellows, MD;  Location: [facility] ENDOSCOPY;  Service: Gastroenterology;  Laterality: N/A;   MASTECTOMY Right 2016   BREAST CA   TOTAL MASTECTOMY Left 05/29/2020   Procedure: TOTAL MASTECTOMY;  Surgeon: Benjamine Sprague, DO;  Location: [facility] ORS;  Service: General;  Laterality: Left;   TUBAL LIGATION      OB History   No obstetric history on file.      Home Medications    Prior to Admission medications   Medication Sig Start Date End Date Taking? Authorizing  Provider  guaiFENesin-codeine (CHERATUSSIN AC) 100-10 MG/5ML syrup Take 10 mLs by mouth 3 (three) times daily as needed for cough. 02/09/22  Yes Danton Clap, PA-C  predniSONE (DELTASONE) 20 MG tablet Take 2 tablets (40 mg total) by mouth daily for 5 days. 02/09/22 02/14/22 Yes Danton Clap, PA-C  DULoxetine (CYMBALTA) 60 MG capsule Take 1 capsule (60 mg total) by mouth daily. 12/28/21 06/26/22  Verlon Au, NP  gabapentin (NEURONTIN) 300 MG capsule Take 2 capsules (600 mg total) by mouth at bedtime. 10/28/21   Verlon Au, NP  hydrOXYzine (ATARAX/VISTARIL) 25 MG tablet Take 1 tablet (25 mg total) by mouth every 6 (six) hours as needed for itching. Patient not taking: Reported on 08/26/2021 05/24/19   Melynda Ripple, MD  ibuprofen (ADVIL) 200 MG tablet Take 200 mg by mouth every 6 (six) hours as needed.    [provider]  ipratropium-albuterol (DUONEB) 0.5-2.5 (3) MG/3ML SOLN Take 3 mLs by nebulization every 6 (six) hours as needed. 03/23/18 12/27/18  Juline Patch, MD    Family History Family History  Problem Relation Age of Onset   Cancer Mother    Breast cancer Mother 60   Stroke Brother    Cancer Maternal Aunt     Social History Social History   Tobacco  Use   Smoking status: Former    Years: 15.00    Types: Cigarettes, E-cigarettes    Quit date: 05/23/2014    Years since quitting: 7.7   Smokeless tobacco: Former  Scientific laboratory technician Use: Former  Substance Use Topics   Alcohol use: No   Drug use: No     Allergies   Tramadol   Review of Systems Review of Systems  Constitutional:  Positive for fatigue and fever. Negative for chills and diaphoresis.  HENT:  Positive for congestion, postnasal drip, rhinorrhea and sore throat. Negative for ear pain, sinus pressure and sinus pain.   Respiratory:  Positive for cough. Negative for shortness of breath and wheezing.   Cardiovascular:  Negative for chest pain.  Gastrointestinal:  Negative for abdominal pain,  nausea and vomiting.  Musculoskeletal:  Negative for arthralgias and myalgias.  Skin:  Negative for rash.  Neurological:  Negative for weakness and headaches.  Hematological:  Negative for adenopathy.  Psychiatric/Behavioral:  Positive for sleep disturbance.      Physical Exam Triage Vital Signs ED Triage Vitals  Enc Vitals Group     BP      Pulse      Resp      Temp      Temp src      SpO2      Weight      Height      Head Circumference      Peak Flow      Pain Score      Pain Loc      Pain Edu?      Excl. in [place]?    No data found.  Updated Vital Signs BP (!) 134/106 (BP Location: Left Arm)   Pulse 98   Temp 98.3 F (36.8 C) (Oral)   Resp 18   LMP 04/22/2019   SpO2 99%      Physical Exam Vitals and nursing note reviewed.  Constitutional:      General: She is not in acute distress.    Appearance: Normal appearance. She is ill-appearing. She is not toxic-appearing.  HENT:     Head: Normocephalic and atraumatic.     Nose: Congestion present.     Mouth/Throat:     Mouth: Mucous membranes are moist.     Pharynx: Oropharynx is clear. Posterior oropharyngeal erythema (mild with injection and clear PND) present.  Eyes:     General: No scleral icterus.       Right eye: No discharge.        Left eye: No discharge.     Conjunctiva/sclera: Conjunctivae normal.  Cardiovascular:     Rate and Rhythm: Normal rate and regular rhythm.     Heart sounds: Normal heart sounds.  Pulmonary:     Effort: Pulmonary effort is normal. No respiratory distress.     Breath sounds: Normal breath sounds. No wheezing, rhonchi or rales.  Musculoskeletal:     Cervical back: Neck supple.  Skin:    General: Skin is dry.  Neurological:     General: No focal deficit present.     Mental Status: She is alert. Mental status is at baseline.     Motor: No weakness.     Gait: Gait normal.  Psychiatric:        Mood and Affect: Mood normal.        Behavior: Behavior normal.        Thought  Content: Thought content normal.  UC Treatments / Results  Labs (all labs ordered are listed, but only abnormal results are displayed) Labs Reviewed  SARS CORONAVIRUS 2 BY RT PCR    EKG   Radiology No results found.  Procedures Procedures (including critical care time)  Medications Ordered in UC Medications - No data to display  Initial Impression / Assessment and Plan / UC Course  I have reviewed the triage vital signs and the nursing notes.  Pertinent labs & imaging results that were available during my care of the patient were reviewed by me and considered in my medical decision making (see chart for details).  52 year old female returning for  cough and congestion x 4 days.   Vitals all normal and stable.  Patient ill-appearing but nontoxic.  On exam she does have nasal congestion, posterior pharyngeal injection and erythema that is mild with clear postnasal drainage.  Chest clear to auscultation heart regular rate and rhythm.  PCR COVID test obtained.  Negative.  Discussed results with patient.  High suspicion for influenza given her reported fever and URI symptoms.  Outside the treatment window for Tamiflu so holding off on testing her.  Reviewed controlled substance database and find patient to be low risk for abuse so prescribed Cheratussin to help with the cough.  Encouraged her to increase rest and fluids and use her inhaler as prescribed for any shortness of breath.  Reviewed returning or going to ED for return of fever, chest pain, increased breathing difficulty not relieved by use of the inhalers or breathing treatments or any severe or persistent new or worsening symptoms.  Work note provided.   Final Clinical Impressions(s) / UC Diagnoses   Final diagnoses:  Viral illness  Acute cough  Asthma with acute exacerbation, unspecified asthma severity, unspecified whether persistent     Discharge Instructions      -You are negative for COVID.  You  likely have another viral illness and it is possible it could be the flu since he feels so poorly but you are outside the treatment window for Tamiflu. - I have sent cough medication and prednisone to the pharmacy.  Continue to use your inhaler if you feel short of breath. - Increase your rest and fluids. - If you develop any increase shortness of breath, please return.       ED Prescriptions     Medication Sig Dispense Auth. Provider   guaiFENesin-codeine (CHERATUSSIN AC) 100-10 MG/5ML syrup Take 10 mLs by mouth 3 (three) times daily as needed for cough. 120 mL Laurene Footman B, PA-C   predniSONE (DELTASONE) 20 MG tablet Take 2 tablets (40 mg total) by mouth daily for 5 days. 10 tablet Danton Clap, PA-C      I have reviewed the PDMP during this encounter.     Danton Clap, PA-C 02/09/22 1217

## 2022-02-24 DIAGNOSIS — G8918 Other acute postprocedural pain: Secondary | ICD-10-CM | POA: Insufficient documentation

## 2022-04-01 ENCOUNTER — Encounter: Payer: Self-pay | Admitting: Plastic Surgery

## 2022-04-01 ENCOUNTER — Ambulatory Visit: Payer: 59 | Admitting: Plastic Surgery

## 2022-04-01 VITALS — BP 141/84 | HR 112 | Ht 65.0 in | Wt 210.4 lb

## 2022-04-01 DIAGNOSIS — C50011 Malignant neoplasm of nipple and areola, right female breast: Secondary | ICD-10-CM | POA: Diagnosis not present

## 2022-04-01 DIAGNOSIS — Z9013 Acquired absence of bilateral breasts and nipples: Secondary | ICD-10-CM

## 2022-04-01 DIAGNOSIS — L905 Scar conditions and fibrosis of skin: Secondary | ICD-10-CM

## 2022-04-01 NOTE — Progress Notes (Signed)
Referring Provider Juline Patch, MD [address] [place],  [place] [address]   CC:  Chief Complaint  Patient presents with   Advice Only      Jill Walsh is an 52 y.o. female.  HPI: Jill Walsh is a very pleasant 52 year old female who presents today for evaluation of an adherent mastectomy scar on the left side.  The patient has undergone mastectomies bilaterally the right side in 2016 and the left side in 2022.  She notes that she has had no difficulty with the right side however the left side had a significant amount of skin removed and is now adherent to the underlying tissues.  When she raises her arm she has discomfort.  She did not have any post surgical radiation therapy.  No Active Allergies  Outpatient Encounter Medications as of 04/01/2022  Medication Sig   DULoxetine (CYMBALTA) 60 MG capsule Take 1 capsule (60 mg total) by mouth daily.   gabapentin (NEURONTIN) 300 MG capsule Take 2 capsules (600 mg total) by mouth at bedtime.   hydrOXYzine (ATARAX/VISTARIL) 25 MG tablet Take 1 tablet (25 mg total) by mouth every 6 (six) hours as needed for itching. (Patient not taking: Reported on 08/26/2021)   ibuprofen (ADVIL) 200 MG tablet Take 200 mg by mouth every 6 (six) hours as needed.   [DISCONTINUED] guaiFENesin-codeine (CHERATUSSIN AC) 100-10 MG/5ML syrup Take 10 mLs by mouth 3 (three) times daily as needed for cough. (Patient not taking: Reported on 04/01/2022)   [DISCONTINUED] ipratropium-albuterol (DUONEB) 0.5-2.5 (3) MG/3ML SOLN Take 3 mLs by nebulization every 6 (six) hours as needed.   No facility-administered encounter medications on file as of 04/01/2022.     Past Medical History:  Diagnosis Date   Asthma    Breast cancer ([place]) 2016   RT MASTECTOMY    Past Surgical History:  Procedure Laterality Date   BREAST BIOPSY Left    02/28/14 negative   BREAST BIOPSY Right     02/2014 malignant   COLONOSCOPY WITH PROPOFOL N/A 11/13/2018   Procedure:  COLONOSCOPY WITH PROPOFOL;  Surgeon: Jonathon Bellows, MD;  Location: [facility] ENDOSCOPY;  Service: Gastroenterology;  Laterality: N/A;   MASTECTOMY Right 2016   BREAST CA   TOTAL MASTECTOMY Left 05/29/2020   Procedure: TOTAL MASTECTOMY;  Surgeon: Benjamine Sprague, DO;  Location: [facility] ORS;  Service: General;  Laterality: Left;   TUBAL LIGATION      Family History  Problem Relation Age of Onset   Cancer Mother    Breast cancer Mother 59   Stroke Brother    Cancer Maternal Aunt     Social History   Social History Narrative   Not on file     Review of Systems General: Denies fevers, chills, weight loss CV: Denies chest pain, shortness of breath, palpitations Breast: Surgically absent Skin: Pain with abduction of the left arm at the shoulder.  Physical Exam    04/01/2022    2:15 PM 02/09/2022   11:33 AM 08/26/2021    9:41 AM  Vitals with BMI  Height 5' 5"$     Weight 210 lbs 6 oz  205 lbs  BMI 0000000    Systolic Q000111Q Q000111Q [id number]  Diastolic 84 A999333 89  Pulse 112 98 92    General:  No acute distress,  Alert and oriented, Non-Toxic, Normal speech and affect Integument: The patient has very tight skin on the left side as well as what appears to be adherence to the underlying muscle. Mammogram: Not  applicable Assessment/Plan Adherent scar: There are several options for the patient's problem.  The simplest would be mobilization of the abdominal wall skin and reclosure of the wound with less tension.  This could be done relatively easily.  The patient states that she was never offered breast reconstruction after her mastectomies.  She believes that her insurance will not cover breast reconstruction.  I have told her that I will check and see if she is a candidate for reconstruction specifically the tissue expander and implant-based reconstruction.  If she is we will consider placing tissue expanders to help address the adherent scar tissue.  If she does not want to have breast reconstruction then we will  proceed with scar revision.  Camillia Herter 04/01/2022, 5:35 PM

## 2022-04-15 ENCOUNTER — Ambulatory Visit: Payer: 59 | Admitting: Plastic Surgery

## 2022-04-15 ENCOUNTER — Encounter: Payer: Self-pay | Admitting: Plastic Surgery

## 2022-04-15 VITALS — BP 143/85 | HR 108 | Ht 65.0 in | Wt 210.0 lb

## 2022-04-15 DIAGNOSIS — C50012 Malignant neoplasm of nipple and areola, left female breast: Secondary | ICD-10-CM

## 2022-04-15 DIAGNOSIS — C50011 Malignant neoplasm of nipple and areola, right female breast: Secondary | ICD-10-CM | POA: Diagnosis not present

## 2022-04-15 NOTE — Progress Notes (Signed)
Jill Walsh returns today with her husband for further discussion regarding breast reconstruction.  I informed her that her healthcare insurance does cover breast reconstruction.  She is interested in proceeding with reconstruction  We had a long talk today regarding tissue-based versus implant-based reconstruction.  I diagrammed for she and her husband the technique for DIEP flap reconstruction.  We also discussed the pros and cons of tissue-based reconstruction versus implant-based reconstruction.  The primary differences being tissue-based reconstruction is a more complicated procedure upfront and implant-based reconstruction probably requires more long-term investment of time on her part.  Neither procedure is a one-time and finished procedure both require maintenance and would usually require some modification after the initial surgery.  She is interested in tissue expander and implant-based reconstruction.  We discussed this at length including the fact that she will probably have some asymmetry because of the profound difference in her mastectomy sites.  The right side has significantly more skin than the left side does.  She also understands that there is a risk of bleeding, infection, seroma formation.  She understands that if there is an infection she will lose the implant or tissue expander and will need to start over from the beginning for any type of reconstruction.  All questions were answered to her satisfaction.  Photographs were obtained with her consent.  Will schedule for tissue expander placement at her request.

## 2022-05-26 ENCOUNTER — Telehealth: Payer: Self-pay | Admitting: Plastic Surgery

## 2022-05-26 NOTE — Telephone Encounter (Signed)
Notified pt that insurance approved surgery procedure and the scheduler would be calling her soon to set up her surgery.

## 2022-05-26 NOTE — Telephone Encounter (Signed)
Pt called asking the status of her sx, last saw Dr Ladona Ridgel 04-15-22, I did let her know that Herbert Seta was out of the office today.

## 2022-06-07 ENCOUNTER — Telehealth: Payer: Self-pay | Admitting: *Deleted

## 2022-06-07 NOTE — Telephone Encounter (Signed)
Spoke with patient to schedule sx and related appts 

## 2022-06-09 ENCOUNTER — Telehealth: Payer: Self-pay | Admitting: *Deleted

## 2022-06-09 NOTE — Telephone Encounter (Signed)
Surgery schedule mailed to patient 

## 2022-06-11 ENCOUNTER — Encounter: Payer: 59 | Admitting: Surgical

## 2022-06-14 ENCOUNTER — Encounter: Payer: Self-pay | Admitting: Physician Assistant

## 2022-06-14 ENCOUNTER — Ambulatory Visit (INDEPENDENT_AMBULATORY_CARE_PROVIDER_SITE_OTHER): Payer: 59 | Admitting: Physician Assistant

## 2022-06-14 VITALS — BP 133/87 | Ht 65.0 in | Wt 208.2 lb

## 2022-06-14 DIAGNOSIS — C50011 Malignant neoplasm of nipple and areola, right female breast: Secondary | ICD-10-CM

## 2022-06-14 DIAGNOSIS — C50012 Malignant neoplasm of nipple and areola, left female breast: Secondary | ICD-10-CM

## 2022-06-14 MED ORDER — ONDANSETRON HCL 4 MG PO TABS: 4.0000 mg | ORAL_TABLET | Freq: Three times a day (TID) | ORAL | 0 refills | Status: DC | PRN

## 2022-06-14 MED ORDER — OXYCODONE-ACETAMINOPHEN 5-325 MG PO TABS: 1.0000 | ORAL_TABLET | Freq: Four times a day (QID) | ORAL | 0 refills | Status: DC | PRN

## 2022-06-14 MED ORDER — SULFAMETHOXAZOLE-TRIMETHOPRIM 800-160 MG PO TABS: 1.0000 | ORAL_TABLET | Freq: Two times a day (BID) | ORAL | 0 refills | Status: DC

## 2022-06-14 NOTE — H&P (View-Only) (Signed)
   Patient ID: Jill Walsh, female    DOB: 05/04/1970, 52 y.o.   MRN: [id number]  Chief Complaint  Patient presents with   Pre-op Exam    No diagnosis found.   History of Present Illness: Jill Walsh is a 52 y.o.  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  She presents for preoperative evaluation for upcoming procedure, bilateral breast reconstruction with placement of tissue expanders and Flex HD, scheduled for 06/29/2022 with Dr. Taylor.  The patient has not had problems with anesthesia.   Summary of Previous Visit: The patient was last seen in the office on 04/15/2022.  She noted a history of bilateral mastectomies in 2016 2022.  She notes no reconstructive efforts at that time, she notes that the left side a large amount of tissue was removed with some scarring down on the chest wall and some difficulty raising her left arm.  She required no postoperative radiation or chemotherapy.  She notes that she is otherwise healthy, she is a non-smoker, no history DVT or PE or any significant risk factors, she does not take any antiplatelets or anticoagulants.  No previous issues with anesthesia.  Job: Works for [employer] this does require some level of lifting and activity-she will plan on taking 6 weeks off for the initial surgery  PMH Significant for: Breast cancer   Past Medical History: Allergies: No Active Allergies  Current Medications:  Current Outpatient Medications:    ibuprofen (ADVIL) 200 MG tablet, Take 200 mg by mouth every 6 (six) hours as needed., Disp: , Rfl:    DULoxetine (CYMBALTA) 60 MG capsule, Take 1 capsule (60 mg total) by mouth daily., Disp: 90 capsule, Rfl: 1   gabapentin (NEURONTIN) 300 MG capsule, Take 2 capsules (600 mg total) by mouth at bedtime., Disp: 120 capsule, Rfl: 0   hydrOXYzine (ATARAX/VISTARIL) 25 MG tablet, Take 1 tablet (25 mg total) by mouth every 6 (six)  hours as needed for itching., Disp: 20 tablet, Rfl: 0  Past Medical Problems: Past Medical History:  Diagnosis Date   Asthma    Breast cancer (HCC) 2016   RT MASTECTOMY    Past Surgical History: Past Surgical History:  Procedure Laterality Date   BREAST BIOPSY Left    02/28/14 negative   BREAST BIOPSY Right     02/2014 malignant   COLONOSCOPY WITH PROPOFOL N/A 11/13/2018   Procedure: COLONOSCOPY WITH PROPOFOL;  Surgeon: Anna, Kiran, MD;  Location: [facility] ENDOSCOPY;  Service: Gastroenterology;  Laterality: N/A;   MASTECTOMY Right 2016   BREAST CA   TOTAL MASTECTOMY Left 05/29/2020   Procedure: TOTAL MASTECTOMY;  Surgeon: Sakai, Isami, DO;  Location: [facility] ORS;  Service: General;  Laterality: Left;   TUBAL LIGATION      Social History: Social History   Socioeconomic History   Marital status: Single    Spouse name: Not on file   Number of children: Not on file   Years of education: Not on file   Highest education level: Not on file  Occupational History   Not on file  Tobacco Use   Smoking status: Former    Years: 15    Types: Cigarettes, E-cigarettes    Quit date: 05/23/2014    Years since quitting: 8.0   Smokeless tobacco: Former  Vaping Use   Vaping Use: Former  Substance and Sexual Activity   Alcohol use: No   Drug use:   No   Sexual activity: Not on file  Other Topics Concern   Not on file  Social History Narrative   Not on file   Social Determinants of Health   Financial Resource Strain: Medium Risk (10/30/2021)   Overall Financial Resource Strain (CARDIA)    Difficulty of Paying Living Expenses: Somewhat hard  Food Insecurity: No Food Insecurity (10/30/2021)   Hunger Vital Sign    Worried About Running Out of Food in the Last Year: Never true    Ran Out of Food in the Last Year: Never true  Transportation Needs: No Transportation Needs (10/30/2021)   PRAPARE - Transportation    Lack of Transportation (Medical): No    Lack of Transportation (Non-Medical): No   Physical Activity: Inactive (10/30/2021)   Exercise Vital Sign    Days of Exercise per Week: 0 days    Minutes of Exercise per Session: 0 min  Stress: Stress Concern Present (10/30/2021)   [employer] - Occupational Stress Questionnaire    Feeling of Stress : Rather much  Social Connections: Socially Integrated (10/30/2021)   Social Connection and Isolation Panel [NHANES]    Frequency of Communication with Friends and Family: Three times a week    Frequency of Social Gatherings with Friends and Family: Twice a week    Attends Religious Services: 1 to 4 times per year    Active Member of Clubs or Organizations: Yes    Attends Club or Organization Meetings: 1 to 4 times per year    Marital Status: Married  Intimate Partner Violence: Patient Declined (10/30/2021)   Humiliation, Afraid, Rape, and Kick questionnaire    Fear of Current or Ex-Partner: Patient declined    Emotionally Abused: Patient declined    Physically Abused: Patient declined    Sexually Abused: Patient declined    Family History: Family History  Problem Relation Age of Onset   Cancer Mother    Breast cancer Mother 62   Stroke Brother    Cancer Maternal Aunt     Review of Systems: ROS  Physical Exam: Vital Signs BP 133/87 (BP Location: Left Arm, Patient Position: Sitting, Cuff Size: Large)   Ht 5' 5" (1.651 m)   Wt 208 lb 3.2 oz (94.4 kg)   LMP 04/22/2019   SpO2 100%   BMI 34.65 kg/m   Physical Exam  Constitutional:      General: Not in acute distress.    Appearance: Normal appearance. Not ill-appearing.  HENT:     Head: Normocephalic and atraumatic.  Eyes:     Pupils: Pupils are equal, round. Cardiovascular:     Rate and Rhythm: Normal rate. Pulmonary:     Effort: No respiratory distress or increased work of breathing.  Speaks in full sentences. Musculoskeletal: Normal range of motion. No lower extremity swelling or edema. No varicosities.  Skin:    General: Skin is  warm and dry.     Findings: No erythema or rash.  Neurological:     Mental Status: Alert and oriented to person, place, and time.  Psychiatric:        Mood and Affect: Mood normal.        Behavior: Behavior normal.     Assessment/Plan:  The patient is scheduled for bilateral breast reconstruction with placement of tissue expanders and Flex HD with Dr. Taylor.  Risks, benefits, and alternatives of procedure discussed, questions answered and consent obtained.    Smoking Status: Non-smoker  Caprini Score: 4; Risk Factors include: Age,   BMI, length of surgery; Recommendation is early ambulation  Pictures obtained: Previous visit  Post-op Rx sent to pharmacy: Bactrim, Percocet, Zofran  Patient was provided with the breast reconstruction consent document and Pain Medication Agreement prior to their appointment.  They had adequate time to read through the risk consent documents and Pain Medication Agreement. We also discussed them in person together during this preop appointment. All of their questions were answered to their satisfaction.  Recommended calling if they have any further questions.  Risk consent form and Pain Medication Agreement to be scanned into patient's chart.  Electronically signed by: Varnell Orvis Todd Iqra Rotundo, PA-C 06/14/2022 1:34 PM  

## 2022-06-14 NOTE — Progress Notes (Signed)
Patient ID: Jill Walsh, female    DOB: 1970-06-17, 52 y.o.   MRN: [id number]  Chief Complaint  Patient presents with   Pre-op Exam    No diagnosis found.   History of Present Illness: Jill Walsh is a 52 y.o.  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  She presents for preoperative evaluation for upcoming procedure, bilateral breast reconstruction with placement of tissue expanders and Flex HD, scheduled for 06/29/2022 with Dr. Ladona Ridgel.  The patient has not had problems with anesthesia.   Summary of Previous Visit: The patient was last seen in the office on 04/15/2022.  She noted a history of bilateral mastectomies in 2016 2022.  She notes no reconstructive efforts at that time, she notes that the left side a large amount of tissue was removed with some scarring down on the chest wall and some difficulty raising her left arm.  She required no postoperative radiation or chemotherapy.  She notes that she is otherwise healthy, she is a non-smoker, no history DVT or PE or any significant risk factors, she does not take any antiplatelets or anticoagulants.  No previous issues with anesthesia.  Job: Works for [employer] this does require some level of lifting and activity-she will plan on taking 6 weeks off for the initial surgery  PMH Significant for: Breast cancer   Past Medical History: Allergies: No Active Allergies  Current Medications:  Current Outpatient Medications:    ibuprofen (ADVIL) 200 MG tablet, Take 200 mg by mouth every 6 (six) hours as needed., Disp: , Rfl:    DULoxetine (CYMBALTA) 60 MG capsule, Take 1 capsule (60 mg total) by mouth daily., Disp: 90 capsule, Rfl: 1   gabapentin (NEURONTIN) 300 MG capsule, Take 2 capsules (600 mg total) by mouth at bedtime., Disp: 120 capsule, Rfl: 0   hydrOXYzine (ATARAX/VISTARIL) 25 MG tablet, Take 1 tablet (25 mg total) by mouth every 6 (six)  hours as needed for itching., Disp: 20 tablet, Rfl: 0  Past Medical Problems: Past Medical History:  Diagnosis Date   Asthma    Breast cancer (HCC) 2016   RT MASTECTOMY    Past Surgical History: Past Surgical History:  Procedure Laterality Date   BREAST BIOPSY Left    02/28/14 negative   BREAST BIOPSY Right     02/2014 malignant   COLONOSCOPY WITH PROPOFOL N/A 11/13/2018   Procedure: COLONOSCOPY WITH PROPOFOL;  Surgeon: Wyline Mood, MD;  Location: [facility] ENDOSCOPY;  Service: Gastroenterology;  Laterality: N/A;   MASTECTOMY Right 2016   BREAST CA   TOTAL MASTECTOMY Left 05/29/2020   Procedure: TOTAL MASTECTOMY;  Surgeon: Sung Amabile, DO;  Location: [facility] ORS;  Service: General;  Laterality: Left;   TUBAL LIGATION      Social History: Social History   Socioeconomic History   Marital status: Single    Spouse name: Not on file   Number of children: Not on file   Years of education: Not on file   Highest education level: Not on file  Occupational History   Not on file  Tobacco Use   Smoking status: Former    Years: 15    Types: Cigarettes, E-cigarettes    Quit date: 05/23/2014    Years since quitting: 8.0   Smokeless tobacco: Former  Building services engineer Use: Former  Substance and Sexual Activity   Alcohol use: No   Drug use:  No   Sexual activity: Not on file  Other Topics Concern   Not on file  Social History Narrative   Not on file   Social Determinants of Health   Financial Resource Strain: Medium Risk (10/30/2021)   Overall Financial Resource Strain (CARDIA)    Difficulty of Paying Living Expenses: Somewhat hard  Food Insecurity: No Food Insecurity (10/30/2021)   Hunger Vital Sign    Worried About Running Out of Food in the Last Year: Never true    Ran Out of Food in the Last Year: Never true  Transportation Needs: No Transportation Needs (10/30/2021)   PRAPARE - Administrator, Civil Service (Medical): No    Lack of Transportation (Non-Medical): No   Physical Activity: Inactive (10/30/2021)   Exercise Vital Sign    Days of Exercise per Week: 0 days    Minutes of Exercise per Session: 0 min  Stress: Stress Concern Present (10/30/2021)   [employer] of Occupational Health - Occupational Stress Questionnaire    Feeling of Stress : Rather much  Social Connections: Socially Integrated (10/30/2021)   Social Connection and Isolation Panel [NHANES]    Frequency of Communication with Friends and Family: Three times a week    Frequency of Social Gatherings with Friends and Family: Twice a week    Attends Religious Services: 1 to 4 times per year    Active Member of [employer] or Organizations: Yes    Attends Banker Meetings: 1 to 4 times per year    Marital Status: Married  Catering manager Violence: Patient Declined (10/30/2021)   Humiliation, Afraid, Rape, and Kick questionnaire    Fear of Current or Ex-Partner: Patient declined    Emotionally Abused: Patient declined    Physically Abused: Patient declined    Sexually Abused: Patient declined    Family History: Family History  Problem Relation Age of Onset   Cancer Mother    Breast cancer Mother 10   Stroke Brother    Cancer Maternal Aunt     Review of Systems: ROS  Physical Exam: Vital Signs BP 133/87 (BP Location: Left Arm, Patient Position: Sitting, Cuff Size: Large)   Ht 5\' 5"  (1.651 m)   Wt 208 lb 3.2 oz (94.4 kg)   LMP 04/22/2019   SpO2 100%   BMI 34.65 kg/m   Physical Exam  Constitutional:      General: Not in acute distress.    Appearance: Normal appearance. Not ill-appearing.  HENT:     Head: Normocephalic and atraumatic.  Eyes:     Pupils: Pupils are equal, round. Cardiovascular:     Rate and Rhythm: Normal rate. Pulmonary:     Effort: No respiratory distress or increased work of breathing.  Speaks in full sentences. Musculoskeletal: Normal range of motion. No lower extremity swelling or edema. No varicosities.  Skin:    General: Skin is  warm and dry.     Findings: No erythema or rash.  Neurological:     Mental Status: Alert and oriented to person, place, and time.  Psychiatric:        Mood and Affect: Mood normal.        Behavior: Behavior normal.     Assessment/Plan:  The patient is scheduled for bilateral breast reconstruction with placement of tissue expanders and Flex HD with Dr. Ladona Ridgel.  Risks, benefits, and alternatives of procedure discussed, questions answered and consent obtained.    Smoking Status: Non-smoker  Caprini Score: 4; Risk Factors include: Age,  BMI, length of surgery; Recommendation is early ambulation  Pictures obtained: Previous visit  Post-op Rx sent to pharmacy: Bactrim, Percocet, Zofran  Patient was provided with the breast reconstruction consent document and Pain Medication Agreement prior to their appointment.  They had adequate time to read through the risk consent documents and Pain Medication Agreement. We also discussed them in person together during this preop appointment. All of their questions were answered to their satisfaction.  Recommended calling if they have any further questions.  Risk consent form and Pain Medication Agreement to be scanned into patient's chart.  Electronically signed by: Kelle Darting Kasey Hansell, PA-C 06/14/2022 1:34 PM

## 2022-06-15 ENCOUNTER — Telehealth: Payer: Self-pay | Admitting: Plastic Surgery

## 2022-06-15 NOTE — Telephone Encounter (Signed)
FMLA/STD papers filled out and called pt to collect 25.00 fee.  Pt will call Thursday after she gets paid to pay.

## 2022-06-17 DIAGNOSIS — Z719 Counseling, unspecified: Secondary | ICD-10-CM

## 2022-06-21 ENCOUNTER — Encounter (HOSPITAL_BASED_OUTPATIENT_CLINIC_OR_DEPARTMENT_OTHER): Payer: Self-pay | Admitting: Plastic Surgery

## 2022-06-21 ENCOUNTER — Other Ambulatory Visit: Payer: Self-pay

## 2022-06-28 MED ORDER — CHLORHEXIDINE GLUCONATE CLOTH 2 % EX PADS: 6.0000 | MEDICATED_PAD | Freq: Once | CUTANEOUS | Status: DC

## 2022-06-28 NOTE — Progress Notes (Signed)

## 2022-06-29 ENCOUNTER — Encounter (HOSPITAL_BASED_OUTPATIENT_CLINIC_OR_DEPARTMENT_OTHER): Payer: Self-pay | Admitting: Plastic Surgery

## 2022-06-29 ENCOUNTER — Ambulatory Visit (HOSPITAL_BASED_OUTPATIENT_CLINIC_OR_DEPARTMENT_OTHER)
Admission: RE | Admit: 2022-06-29 | Discharge: 2022-06-29 | Disposition: A | Discharge status: Home / Self Care | Payer: 59 | Attending: Plastic Surgery | Admitting: Plastic Surgery

## 2022-06-29 ENCOUNTER — Ambulatory Visit (HOSPITAL_BASED_OUTPATIENT_CLINIC_OR_DEPARTMENT_OTHER): Payer: 59 | Admitting: Anesthesiology

## 2022-06-29 ENCOUNTER — Encounter (HOSPITAL_BASED_OUTPATIENT_CLINIC_OR_DEPARTMENT_OTHER)
Admission: RE | Disposition: A | Discharge status: Home / Self Care | Payer: Self-pay | Source: Home / Self Care | Attending: Plastic Surgery

## 2022-06-29 ENCOUNTER — Other Ambulatory Visit: Payer: Self-pay

## 2022-06-29 DIAGNOSIS — C50012 Malignant neoplasm of nipple and areola, left female breast: Secondary | ICD-10-CM

## 2022-06-29 DIAGNOSIS — Z87891 Personal history of nicotine dependence: Secondary | ICD-10-CM | POA: Insufficient documentation

## 2022-06-29 DIAGNOSIS — C50011 Malignant neoplasm of nipple and areola, right female breast: Secondary | ICD-10-CM | POA: Diagnosis not present

## 2022-06-29 DIAGNOSIS — Z9013 Acquired absence of bilateral breasts and nipples: Secondary | ICD-10-CM | POA: Insufficient documentation

## 2022-06-29 DIAGNOSIS — Z853 Personal history of malignant neoplasm of breast: Secondary | ICD-10-CM | POA: Insufficient documentation

## 2022-06-29 DIAGNOSIS — J45909 Unspecified asthma, uncomplicated: Secondary | ICD-10-CM

## 2022-06-29 DIAGNOSIS — Z803 Family history of malignant neoplasm of breast: Secondary | ICD-10-CM | POA: Insufficient documentation

## 2022-06-29 DIAGNOSIS — Z421 Encounter for breast reconstruction following mastectomy: Secondary | ICD-10-CM

## 2022-06-29 DIAGNOSIS — C50912 Malignant neoplasm of unspecified site of left female breast: Secondary | ICD-10-CM | POA: Diagnosis not present

## 2022-06-29 DIAGNOSIS — C50911 Malignant neoplasm of unspecified site of right female breast: Secondary | ICD-10-CM | POA: Diagnosis not present

## 2022-06-29 DIAGNOSIS — Z01818 Encounter for other preprocedural examination: Secondary | ICD-10-CM

## 2022-06-29 DIAGNOSIS — Z428 Encounter for other plastic and reconstructive surgery following medical procedure or healed injury: Secondary | ICD-10-CM | POA: Insufficient documentation

## 2022-06-29 DIAGNOSIS — Z79899 Other long term (current) drug therapy: Secondary | ICD-10-CM | POA: Insufficient documentation

## 2022-06-29 HISTORY — PX: BREAST RECONSTRUCTION WITH PLACEMENT OF TISSUE EXPANDER AND FLEX HD (ACELLULAR HYDRATED DERMIS): SHX6295

## 2022-06-29 SURGERY — BREAST RECONSTRUCTION WITH PLACEMENT OF TISSUE EXPANDER AND FLEX HD (ACELLULAR HYDRATED DERMIS)
Anesthesia: General | Site: Breast | Laterality: Bilateral

## 2022-06-29 MED ORDER — PROMETHAZINE HCL 25 MG/ML IJ SOLN: 6.2500 mg | INTRAMUSCULAR | Status: DC | PRN

## 2022-06-29 MED ORDER — ACETAMINOPHEN 500 MG PO TABS
ORAL_TABLET | ORAL | Status: AC
  Filled 2022-06-29: qty 2

## 2022-06-29 MED ORDER — ROCURONIUM BROMIDE 100 MG/10ML IV SOLN
INTRAVENOUS | Status: DC | PRN
  Administered 2022-06-29: 20 mg via INTRAVENOUS
  Administered 2022-06-29: 60 mg via INTRAVENOUS

## 2022-06-29 MED ORDER — PHENYLEPHRINE HCL (PRESSORS) 10 MG/ML IV SOLN
INTRAVENOUS | Status: DC | PRN
  Administered 2022-06-29: 80 ug via INTRAVENOUS
  Administered 2022-06-29 (×2): 160 ug via INTRAVENOUS

## 2022-06-29 MED ORDER — SODIUM CHLORIDE 0.9 % IV SOLN
INTRAVENOUS | Status: DC | PRN
  Administered 2022-06-29: 40 mL

## 2022-06-29 MED ORDER — FENTANYL CITRATE (PF) 100 MCG/2ML IJ SOLN
INTRAMUSCULAR | Status: AC
  Filled 2022-06-29: qty 2

## 2022-06-29 MED ORDER — SODIUM CHLORIDE 0.9 % IV SOLN
INTRAVENOUS | Status: DC | PRN
  Administered 2022-06-29: 400 mL

## 2022-06-29 MED ORDER — PROPOFOL 10 MG/ML IV BOLUS
INTRAVENOUS | Status: DC | PRN
  Administered 2022-06-29: 200 mg via INTRAVENOUS

## 2022-06-29 MED ORDER — ONDANSETRON HCL 4 MG/2ML IJ SOLN
INTRAMUSCULAR | Status: AC
  Filled 2022-06-29: qty 2

## 2022-06-29 MED ORDER — OXYCODONE HCL 5 MG PO TABS
5.0000 mg | ORAL_TABLET | Freq: Once | ORAL | Status: AC | PRN
  Administered 2022-06-29: 5 mg via ORAL

## 2022-06-29 MED ORDER — SODIUM CHLORIDE (PF) 0.9 % IJ SOLN
INTRAMUSCULAR | Status: AC
  Filled 2022-06-29: qty 10

## 2022-06-29 MED ORDER — LIDOCAINE 2% (20 MG/ML) 5 ML SYRINGE
INTRAMUSCULAR | Status: AC
  Filled 2022-06-29: qty 5

## 2022-06-29 MED ORDER — LACTATED RINGERS IV SOLN: INTRAVENOUS | Status: DC

## 2022-06-29 MED ORDER — ACETAMINOPHEN 500 MG PO TABS
1000.0000 mg | ORAL_TABLET | Freq: Once | ORAL | Status: AC
  Administered 2022-06-29: 1000 mg via ORAL

## 2022-06-29 MED ORDER — SODIUM CHLORIDE 0.9 % IV SOLN
INTRAVENOUS | Status: AC
  Filled 2022-06-29: qty 10

## 2022-06-29 MED ORDER — CELECOXIB 200 MG PO CAPS
200.0000 mg | ORAL_CAPSULE | Freq: Once | ORAL | Status: AC
  Administered 2022-06-29: 200 mg via ORAL

## 2022-06-29 MED ORDER — MIDAZOLAM HCL 5 MG/5ML IJ SOLN
INTRAMUSCULAR | Status: DC | PRN
  Administered 2022-06-29: 2 mg via INTRAVENOUS

## 2022-06-29 MED ORDER — LIDOCAINE HCL (CARDIAC) PF 100 MG/5ML IV SOSY
PREFILLED_SYRINGE | INTRAVENOUS | Status: DC | PRN
  Administered 2022-06-29: 60 mg via INTRAVENOUS

## 2022-06-29 MED ORDER — CEFAZOLIN SODIUM-DEXTROSE 2-4 GM/100ML-% IV SOLN
INTRAVENOUS | Status: AC
  Filled 2022-06-29: qty 100

## 2022-06-29 MED ORDER — SCOPOLAMINE 1 MG/3DAYS TD PT72
MEDICATED_PATCH | TRANSDERMAL | Status: AC
  Filled 2022-06-29: qty 1

## 2022-06-29 MED ORDER — DEXAMETHASONE SODIUM PHOSPHATE 4 MG/ML IJ SOLN
INTRAMUSCULAR | Status: DC | PRN
  Administered 2022-06-29: 5 mg via INTRAVENOUS

## 2022-06-29 MED ORDER — ROCURONIUM BROMIDE 10 MG/ML (PF) SYRINGE
PREFILLED_SYRINGE | INTRAVENOUS | Status: AC
  Filled 2022-06-29: qty 10

## 2022-06-29 MED ORDER — HYDROMORPHONE HCL 1 MG/ML IJ SOLN
INTRAMUSCULAR | Status: DC | PRN
  Administered 2022-06-29 (×2): .5 mg via INTRAVENOUS

## 2022-06-29 MED ORDER — BUPIVACAINE LIPOSOME 1.3 % IJ SUSP
INTRAMUSCULAR | Status: AC
  Filled 2022-06-29: qty 20

## 2022-06-29 MED ORDER — ONDANSETRON HCL 4 MG/2ML IJ SOLN
INTRAMUSCULAR | Status: DC | PRN
  Administered 2022-06-29: 4 mg via INTRAVENOUS

## 2022-06-29 MED ORDER — CEFAZOLIN SODIUM-DEXTROSE 2-4 GM/100ML-% IV SOLN
2.0000 g | INTRAVENOUS | Status: AC
  Administered 2022-06-29: 2 g via INTRAVENOUS

## 2022-06-29 MED ORDER — FENTANYL CITRATE (PF) 100 MCG/2ML IJ SOLN
25.0000 ug | INTRAMUSCULAR | Status: DC | PRN
  Administered 2022-06-29: 25 ug via INTRAVENOUS
  Administered 2022-06-29: 50 ug via INTRAVENOUS
  Administered 2022-06-29: 25 ug via INTRAVENOUS

## 2022-06-29 MED ORDER — SUGAMMADEX SODIUM 200 MG/2ML IV SOLN
INTRAVENOUS | Status: DC | PRN
  Administered 2022-06-29: 200 mg via INTRAVENOUS

## 2022-06-29 MED ORDER — SCOPOLAMINE 1 MG/3DAYS TD PT72
1.0000 | MEDICATED_PATCH | TRANSDERMAL | Status: DC
  Administered 2022-06-29: 1.5 mg via TRANSDERMAL

## 2022-06-29 MED ORDER — DEXAMETHASONE SODIUM PHOSPHATE 10 MG/ML IJ SOLN
INTRAMUSCULAR | Status: AC
  Filled 2022-06-29: qty 1

## 2022-06-29 MED ORDER — FENTANYL CITRATE (PF) 100 MCG/2ML IJ SOLN
INTRAMUSCULAR | Status: DC | PRN
  Administered 2022-06-29: 100 ug via INTRAVENOUS

## 2022-06-29 MED ORDER — PROPOFOL 10 MG/ML IV BOLUS
INTRAVENOUS | Status: AC
  Filled 2022-06-29: qty 20

## 2022-06-29 MED ORDER — OXYCODONE HCL 5 MG/5ML PO SOLN: 5.0000 mg | Freq: Once | ORAL | Status: AC | PRN

## 2022-06-29 MED ORDER — POVIDONE-IODINE 10 % EX SOLN
CUTANEOUS | Status: DC | PRN
  Administered 2022-06-29: 1 via TOPICAL

## 2022-06-29 MED ORDER — 0.9 % SODIUM CHLORIDE (POUR BTL) OPTIME
TOPICAL | Status: DC | PRN
  Administered 2022-06-29: 200 mL

## 2022-06-29 MED ORDER — METHOCARBAMOL 500 MG PO TABS: 500.0000 mg | ORAL_TABLET | Freq: Three times a day (TID) | ORAL | 0 refills | Status: DC | PRN

## 2022-06-29 MED ORDER — CELECOXIB 200 MG PO CAPS
ORAL_CAPSULE | ORAL | Status: AC
  Filled 2022-06-29: qty 1

## 2022-06-29 MED ORDER — HYDROMORPHONE HCL 1 MG/ML IJ SOLN
INTRAMUSCULAR | Status: AC
  Filled 2022-06-29: qty 1

## 2022-06-29 MED ORDER — MIDAZOLAM HCL 2 MG/2ML IJ SOLN
INTRAMUSCULAR | Status: AC
  Filled 2022-06-29: qty 2

## 2022-06-29 MED ORDER — OXYCODONE HCL 5 MG PO TABS
ORAL_TABLET | ORAL | Status: AC
  Filled 2022-06-29: qty 1

## 2022-06-29 SURGICAL SUPPLY — 112 items
ADH SKN CLS APL DERMABOND .7 (GAUZE/BANDAGES/DRESSINGS)
APL PRP STRL LF DISP 70% ISPRP (MISCELLANEOUS) ×4
BAG DECANTER FOR FLEXI CONT (MISCELLANEOUS) ×2 IMPLANT
BIOPATCH RED 1 DISK 7.0 (GAUZE/BANDAGES/DRESSINGS) ×2 IMPLANT
BLADE CLIPPER SURG (BLADE) IMPLANT
BLADE SURG 10 STRL SS (BLADE) ×2 IMPLANT
BLADE SURG 15 STRL LF DISP TIS (BLADE) ×2 IMPLANT
BLADE SURG 15 STRL SS (BLADE) ×2
BNDG CMPR 5X2 KNTD ELC UNQ LF (GAUZE/BANDAGES/DRESSINGS)
BNDG CMPR 75X21 PLY HI ABS (MISCELLANEOUS)
BNDG ELASTIC 2INX 5YD STR LF (GAUZE/BANDAGES/DRESSINGS) IMPLANT
BNDG GAUZE DERMACEA FLUFF 4 (GAUZE/BANDAGES/DRESSINGS) IMPLANT
BNDG GZE DERMACEA 4 6PLY (GAUZE/BANDAGES/DRESSINGS)
CANISTER SUCT 1200ML W/VALVE (MISCELLANEOUS) ×2 IMPLANT
CHLORAPREP W/TINT 26 (MISCELLANEOUS) ×4 IMPLANT
CLSR STERI-STRIP ANTIMIC 1/2X4 (GAUZE/BANDAGES/DRESSINGS) IMPLANT
CORD BIPOLAR FORCEPS 12FT (ELECTRODE) IMPLANT
COVER BACK TABLE 60X90IN (DRAPES) ×2 IMPLANT
COVER MAYO STAND STRL (DRAPES) ×2 IMPLANT
DERMABOND ADVANCED .7 DNX12 (GAUZE/BANDAGES/DRESSINGS) IMPLANT
DRAIN CHANNEL 15F RND FF W/TCR (WOUND CARE) IMPLANT
DRAIN CHANNEL 19F RND (DRAIN) ×2 IMPLANT
DRAPE LAPAROTOMY 100X72 PEDS (DRAPES) IMPLANT
DRAPE U-SHAPE 76X120 STRL (DRAPES) IMPLANT
DRSG ADAPTIC 3X8 NADH LF (GAUZE/BANDAGES/DRESSINGS) IMPLANT
DRSG EMULSION OIL 3X3 NADH (GAUZE/BANDAGES/DRESSINGS) IMPLANT
DRSG TEGADERM 4X4.75 (GAUZE/BANDAGES/DRESSINGS) ×2 IMPLANT
ELECT BLADE 4.0 EZ CLEAN MEGAD (MISCELLANEOUS)
ELECT COATED BLADE 2.86 ST (ELECTRODE) ×2 IMPLANT
ELECT NDL BLADE 2-5/6 (NEEDLE) IMPLANT
ELECT NEEDLE BLADE 2-5/6 (NEEDLE) IMPLANT
ELECT REM PT RETURN 9FT ADLT (ELECTROSURGICAL) ×2
ELECT REM PT RETURN 9FT PED (ELECTROSURGICAL)
ELECTRODE BLDE 4.0 EZ CLN MEGD (MISCELLANEOUS) IMPLANT
ELECTRODE REM PT RETRN 9FT PED (ELECTROSURGICAL) IMPLANT
ELECTRODE REM PT RTRN 9FT ADLT (ELECTROSURGICAL) ×2 IMPLANT
EVACUATOR SILICONE 100CC (DRAIN) ×2 IMPLANT
GAUZE PAD ABD 8X10 STRL (GAUZE/BANDAGES/DRESSINGS) ×4 IMPLANT
GAUZE SPONGE 2X2 STRL 8-PLY (GAUZE/BANDAGES/DRESSINGS) IMPLANT
GAUZE SPONGE 4X4 12PLY STRL LF (GAUZE/BANDAGES/DRESSINGS) IMPLANT
GAUZE STRETCH 2X75IN STRL (MISCELLANEOUS) IMPLANT
GAUZE XEROFORM 1X8 LF (GAUZE/BANDAGES/DRESSINGS) IMPLANT
GAUZE XEROFORM 5X9 LF (GAUZE/BANDAGES/DRESSINGS) IMPLANT
GLOVE BIO SURGEON STRL SZ8 (GLOVE) ×2 IMPLANT
GLOVE BIOGEL M STRL SZ7.5 (GLOVE) ×2 IMPLANT
GLOVE BIOGEL PI IND STRL 7.5 (GLOVE) ×2 IMPLANT
GLOVE BIOGEL PI IND STRL 8 (GLOVE) ×2 IMPLANT
GOWN STRL REUS W/ TWL LRG LVL3 (GOWN DISPOSABLE) ×4 IMPLANT
GOWN STRL REUS W/TWL LRG LVL3 (GOWN DISPOSABLE) ×4
IMPL EXPANDER BREAST 375CC (Breast) ×2 IMPLANT
IMPLANT EXPANDER BREAST 375CC (Breast) ×4 IMPLANT
IV NS 500ML (IV SOLUTION)
IV NS 500ML BAXH (IV SOLUTION) IMPLANT
KIT FILL ASEPTIC TRANSFER (MISCELLANEOUS) ×1 IMPLANT
MARKER SKIN DUAL TIP RULER LAB (MISCELLANEOUS) IMPLANT
NDL HYPO 25X1 1.5 SAFETY (NEEDLE) IMPLANT
NDL HYPO 27GX1-1/4 (NEEDLE) IMPLANT
NDL HYPO 30GX1 BEV (NEEDLE) IMPLANT
NDL SPNL 18GX3.5 QUINCKE PK (NEEDLE) IMPLANT
NEEDLE HYPO 25X1 1.5 SAFETY (NEEDLE) IMPLANT
NEEDLE HYPO 27GX1-1/4 (NEEDLE) IMPLANT
NEEDLE HYPO 30GX1 BEV (NEEDLE) IMPLANT
NEEDLE SPNL 18GX3.5 QUINCKE PK (NEEDLE) IMPLANT
NS IRRIG 1000ML POUR BTL (IV SOLUTION) IMPLANT
PACK BASIN DAY SURGERY FS (CUSTOM PROCEDURE TRAY) ×2 IMPLANT
PACK UNIVERSAL I (CUSTOM PROCEDURE TRAY) ×2 IMPLANT
PENCIL SMOKE EVACUATOR (MISCELLANEOUS) ×2 IMPLANT
PIN SAFETY STERILE (MISCELLANEOUS) ×2 IMPLANT
SHEET MEDIUM DRAPE 40X70 STRL (DRAPES) IMPLANT
SLEEVE SCD COMPRESS KNEE MED (STOCKING) ×2 IMPLANT
SPIKE FLUID TRANSFER (MISCELLANEOUS) IMPLANT
SPONGE T-LAP 18X18 ~~LOC~~+RFID (SPONGE) ×4 IMPLANT
STAPLER INSORB 30 2030 C-SECTI (MISCELLANEOUS) IMPLANT
STAPLER VISISTAT 35W (STAPLE) IMPLANT
STRIP CLOSURE SKIN 1/2X4 (GAUZE/BANDAGES/DRESSINGS) IMPLANT
STRIP SUTURE WOUND CLOSURE 1/2 (MISCELLANEOUS) IMPLANT
SUCTION FRAZIER HANDLE 10FR (MISCELLANEOUS)
SUCTION TUBE FRAZIER 10FR DISP (MISCELLANEOUS) IMPLANT
SUT CHROMIC 4 0 P 3 18 (SUTURE) IMPLANT
SUT CHROMIC 5 0 P 3 (SUTURE) IMPLANT
SUT ETHILON 4 0 P 3 18 (SUTURE) IMPLANT
SUT ETHILON 4 0 PS 2 18 (SUTURE) IMPLANT
SUT MNCRL 6-0 UNDY P1 1X18 (SUTURE) IMPLANT
SUT MNCRL AB 3-0 PS2 18 (SUTURE) ×1 IMPLANT
SUT MNCRL AB 3-0 PS2 27 (SUTURE) ×2 IMPLANT
SUT MNCRL AB 4-0 PS2 18 (SUTURE) ×2 IMPLANT
SUT MON AB 5-0 P3 18 (SUTURE) IMPLANT
SUT NYLON ETHILON 5-0 P-3 1X18 (SUTURE) IMPLANT
SUT PDS 3-0 CT2 (SUTURE)
SUT PDS II 3-0 CT2 27 ABS (SUTURE) IMPLANT
SUT PLAIN 5 0 P 3 18 (SUTURE) IMPLANT
SUT PROLENE 4 0 PS 2 18 (SUTURE) IMPLANT
SUT SILK 2 0 SH (SUTURE) ×2 IMPLANT
SUT SILK 3 0 RB1 (SUTURE) IMPLANT
SUT VIC AB 2-0 SH 27 (SUTURE) ×6
SUT VIC AB 2-0 SH 27XBRD (SUTURE) ×3 IMPLANT
SUT VIC AB 3-0 SH 27 (SUTURE) ×12
SUT VIC AB 3-0 SH 27X BRD (SUTURE) ×6 IMPLANT
SUT VIC AB 4-0 PS2 18 (SUTURE) IMPLANT
SUT VIC AB 5-0 P-3 18X BRD (SUTURE) IMPLANT
SUT VIC AB 5-0 P3 18 (SUTURE)
SYR BULB EAR ULCER 3OZ GRN STR (SYRINGE) IMPLANT
SYR BULB IRRIG 60ML STRL (SYRINGE) ×2 IMPLANT
SYR CONTROL 10ML LL (SYRINGE) ×2 IMPLANT
TAPE MEASURE VINYL STERILE (MISCELLANEOUS) IMPLANT
TISSUE FLEXHD PERF PLIAB 6X16 (Tissue) ×2 IMPLANT
TOWEL GREEN STERILE FF (TOWEL DISPOSABLE) ×4 IMPLANT
TRAY DSU PREP LF (CUSTOM PROCEDURE TRAY) IMPLANT
TUBE CONNECTING 20X1/4 (TUBING) ×2 IMPLANT
TUBING INFILTRATION IT-10001 (TUBING) IMPLANT
UNDERPAD 30X36 HEAVY ABSORB (UNDERPADS AND DIAPERS) ×4 IMPLANT
YANKAUER SUCT BULB TIP NO VENT (SUCTIONS) ×2 IMPLANT

## 2022-06-29 NOTE — Anesthesia Preprocedure Evaluation (Addendum)
Anesthesia Evaluation  Patient identified by MRN, date of birth, ID band Patient awake    Reviewed: Allergy & Precautions, NPO status , Patient's Chart, lab work & pertinent test results  History of Anesthesia Complications Negative for: history of anesthetic complications  Airway Mallampati: II  TM Distance: >3 FB Neck ROM: Full    Dental  (+) Dental Advisory Given, Partial Lower   Pulmonary asthma , former smoker   Pulmonary exam normal        Cardiovascular negative cardio ROS Normal cardiovascular exam     Neuro/Psych negative neurological ROS  negative psych ROS   GI/Hepatic negative GI ROS, Neg liver ROS,,,  Endo/Other   Obesity   Renal/GU negative Renal ROS     Musculoskeletal negative musculoskeletal ROS (+)    Abdominal   Peds  Hematology negative hematology ROS (+)   Anesthesia Other Findings   Reproductive/Obstetrics  s/p tubal ligation Breast cancer                              Anesthesia Physical Anesthesia Plan  ASA: 2  Anesthesia Plan: General   Post-op Pain Management: Tylenol PO (pre-op)* and Celebrex PO (pre-op)*   Induction: Intravenous  PONV Risk Score and Plan: 3 and Treatment may vary due to age or medical condition, Ondansetron, Dexamethasone, Midazolam and Scopolamine patch - Pre-op  Airway Management Planned: Oral ETT  Additional Equipment: None  Intra-op Plan:   Post-operative Plan: Extubation in OR  Informed Consent: I have reviewed the patients History and Physical, chart, labs and discussed the procedure including the risks, benefits and alternatives for the proposed anesthesia with the patient or authorized representative who has indicated his/her understanding and acceptance.     Dental advisory given  Plan Discussed with: CRNA and Anesthesiologist  Anesthesia Plan Comments:         Anesthesia Quick Evaluation

## 2022-06-29 NOTE — Anesthesia Postprocedure Evaluation (Signed)
Anesthesia Post Note  Patient: Markan Kilmer  Procedure(s) Performed: BREAST RECONSTRUCTION WITH PLACEMENT OF TISSUE EXPANDER AND FLEX HD (ACELLULAR HYDRATED DERMIS) (Bilateral: Breast)     Patient location during evaluation: PACU Anesthesia Type: General Level of consciousness: awake and alert Pain management: pain level controlled Vital Signs Assessment: post-procedure vital signs reviewed and stable Respiratory status: spontaneous breathing, nonlabored ventilation and respiratory function stable Cardiovascular status: blood pressure returned to baseline and stable Postop Assessment: no apparent nausea or vomiting Anesthetic complications: no   No notable events documented.  Last Vitals:  Vitals:   06/29/22 1526 06/29/22 1623  BP:  129/80  Pulse: 85 86  Resp: 12 18  Temp:  36.9 C  SpO2: 92% 92%    Last Pain:  Vitals:   06/29/22 1623  TempSrc:   PainSc: 5                  Lowella Curb

## 2022-06-29 NOTE — Interval H&P Note (Signed)
History and Physical Interval Note: No change in physical exam or indication for surgery. Surgical site marked with the patients concurrence. 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  The various methods of treatment have been discussed with the patient and family. After consideration of risks, benefits and other options for treatment, the patient has consented to  Procedure(s): BREAST RECONSTRUCTION WITH PLACEMENT OF TISSUE EXPANDER AND FLEX HD (ACELLULAR HYDRATED DERMIS) (Bilateral) revision, adherent mastectomy scar, left (Left) as a surgical intervention.  The patient's history has been reviewed, patient examined, no change in status, stable for surgery.  I have reviewed the patient's chart and labs.  Questions were answered to the patient's satisfaction.     Santiago Glad

## 2022-06-29 NOTE — Op Note (Signed)
DATE OF OPERATION: 06/29/2022  LOCATION: [facility] operating Room  PREOPERATIVE DIAGNOSIS: Bilateral mastectomies, treatment for breast cancer  POSTOPERATIVE DIAGNOSIS: Same  PROCEDURE: Bilateral tissue expander placement  SURGEON: MD  ASSISTANT: Caroline More, primary, Matt Scheeler, [place], medical student  EBL: 25 cc  CONDITION: Stable  COMPLICATIONS: None  INDICATION: The patient, Jill Walsh, is a 52 y.o. female born on [birth year], is here for construction after bilateral mastectomies.   PROCEDURE DETAILS:  The patient was seen prior to surgery and marked.  IV antibiotics were given. The patient was taken to the operating room and given a general anesthetic. A standard time out was performed and all information was confirmed by those in the room. SCDs were placed.   The chest was prepped and draped in usual sterile manner.  The left chest was addressed first.  The previous scar was incised and the mastectomy flaps were elevated off of the pectoralis muscle.  The lateral border of the pectoralis muscle was identified and the subpectoral space was developed with a combination of sharp and blunt dissection.  Under direct visualization all bleeding was controlled.  The inferior border of the pectoralis muscle was detached from the chest wall and the pocket for the tissue expander was developed.  Once the pocket was completed the surgical site was inspected for bleeding and meticulous hemostasis was achieved.  Betadine soaked laparotomy tapes were placed in the wound and attention was turned to the right side.  Again the previous scar was incised the mastectomy flaps elevated and the pectoralis muscle identified.  The pectoralis muscle was elevated and the subpectoral space was developed.  The distal insertion of the pectoralis muscle was divided elevating the pectoralis from the chest wall.  The remainder of the pocket was developed.  The pocket was inspected for bleeding  meticulous hemostasis was achieved.  A 6 x 16 cm piece of Flex HD was then soaked in saline antibiotics.  The ADM was sutured in place to the inframammary fold inferiorly and the serratus muscle laterally with running and interrupted 3-0 Vicryl sutures.  Medially the ADM was sutured to the lateral border of the pectoralis muscle leaving a space for placement of the tissue expander.  On the left side a similar procedure was performed for a 6 x 16 cm portion of Flex HD, ADM, was placed at the lateral border of the pectoralis and sutured to the inframammary fold and the serratus muscle.  Once the ADM was in place the entire surgical team changed gloves and the skin was reprepped with Betadine and fresh green towels.   Mentor Arturo 375 cc tissue expanders were opened and placed in a saline containing antibiotic solution.  The air was removed and the tissue expanders were placed in the pocket on the left first and then the right.  Suture tabs were sutured in place with Vicryl sutures and the ADM was closed over the tissue expander.  The skin was then closed with interrupted 3-0 Monocryl sutures in the dermis and a running 4-0 Monocryl subcuticular stitch in the skin.  Due to the tension on the skin on the left side I elected not to proceed with any tissue expansion today.  The skin edges were sealed with Dermabond the patient was placed in a supportive garment and she was awakened from anesthesia without incident.  All instrument needle and sponge counts were reported as correct and the patient was transferred to the recovery room in good condition. The patient was  allowed to wake up and taken to recovery room in stable condition at the end of the case. The family was notified at the end of the case.   The advanced practice practitioner (APP) assisted throughout the case.  The APP was essential in retraction and counter traction when needed to make the case progress smoothly.  This retraction and assistance made it  possible to see the tissue plans for the procedure.  The assistance was needed for blood control, tissue re-approximation and assisted with closure of the incision site.

## 2022-06-29 NOTE — Discharge Instructions (Addendum)
INSTRUCTIONS FOR AFTER BREAST SURGERY   You will likely have some questions about what to expect following your operation.  The following information will help you and your family understand what to expect when you are discharged from the hospital.  It is important to follow these guidelines to help ensure a smooth recovery and reduce complication.  Postoperative instructions include information on: diet, wound care, medications and physical activity.  AFTER SURGERY Expect to go home after the procedure.  In some cases, you may need to spend one night in the hospital for observation.  DIET Breast surgery does not require a specific diet.  However, the healthier you eat the better your body will heal. It is important to increasing your protein intake.  This means limiting the foods with sugar and carbohydrates.  Focus on vegetables and some meat.  If you have liposuction during your procedure be sure to drink water.  If your urine is bright yellow, then it is concentrated, and you need to drink more water.  As a general rule after surgery, you should have 8 ounces of water every hour while awake.  If you find you are persistently nauseated or unable to take in liquids let us know.  NO TOBACCO USE or EXPOSURE.  This will slow your healing process and lead to a wound.  WOUND CARE Leave the binder on at all times.  NO SHOWERS until drains have been removed by a provider at the clinic and they have cleared you to take showers No baths, pools or hot tubs until cleared by provider  We close your incision to leave the smallest and best-looking scar. No ointment or creams on your incisions for four weeks.  No Neosporin (Too many skin reactions).  A few weeks after surgery you can use Mederma and start massaging the scar. We ask you to wear your binder or sports bra for the first 6 weeks around the clock, including while sleeping. This provides added comfort and helps reduce the fluid accumulation at the  surgery site. NO Ice or heating pads to the operative site.  You have a very high risk of a BURN before you feel the temperature change. Continue to empty, recharge, & record drainage from drains 2-3 times a day, as needed.  ACTIVITY No heavy lifting until cleared by the doctor.  This usually means no more than a half-gallon of milk.  It is OK to walk and climb stairs. Moving your legs is very important to decrease your risk of a blood clot.  It will also help keep you from getting deconditioned.  Every 1 to 2 hours get up and walk for 5 minutes. This will help with a quicker recovery back to normal.  Let pain be your guide so you don't do too much.  This time is for you to recover.  You will be more comfortable if you sleep and rest with your head elevated either with a few pillows under you or in a recliner.  No stomach sleeping for a three months.  WORK Everyone returns to work at different times. As a rough guide, most people take at least 1 - 2 weeks off prior to returning to work. If you need documentation for your job, give the forms to the front staff at the clinic.  DRIVING Arrange for someone to bring you home from the hospital after your surgery.  You may be able to drive a few days after surgery but not while taking any narcotics or  valium.  BOWEL MOVEMENTS Constipation can occur after anesthesia and while taking pain medication.  It is important to stay ahead for your comfort.  We recommend taking Milk of Magnesia (2 tablespoons; twice a day) while taking the pain pills.  MEDICATIONS You may be prescribed should start after surgery At your preoperative visit for you history and physical you may have been given the following medications: An antibiotic: Start this medication when you get home and take according to the instructions on the bottle. Zofran 4 mg:  This is to treat nausea and vomiting.  You can take this every 6 hours as needed and only if needed. Robaxin 500 mg: This is for  muscle tightness/muscle spasm if you have an implant or expander. This will help relax your muscle which also helps with pain control.  Take as directed on the label. Don't drive after taking this medication. Use caution when taking this medication as it can cause drowsiness and can be sedating. Do not take at the same time as narcotic medication or other medications that can cause drowsiness (I.e. gabapentin) Oxycodone 5 mg:  This is only to be used after you have taken the Motrin or the Tylenol. Every 8 hours as needed.  No Tylenol or Ibuprofen today until after 3:15 PM No oxycodone til after 9:45 PM today  Over the counter Medication to take: Ibuprofen (Motrin) 600 mg:  Take this every 6 hours.  If you have additional pain then take 500 mg of the Tylenol every 8 hours.  Only take the Norco after you have tried these two. MiraLAX or Milk of Magnesia: Take this according to the bottle if you take the Norco.  WHEN TO CALL Call your surgeon's office if any of the following occur: Fever 101 degrees F or greater Excessive bleeding or fluid from the incision site. Pain that increases over time without aid from the medications Redness, warmth, or pus draining from incision sites Persistent nausea or inability to take in liquids Severe misshapen area that underwent the operation.  Here are some resources for breast cancer patients:  Plastic surgery website: https://www.plasticsurgery.org/for-medical-professionals/education-and-resources/publications/breast-reconstruction-magazine Breast Reconstruction Awareness Campaign:  [employer] Plastic surgery Implant information:  https://www.plasticsurgery.org/patient-safety/breast-implant-safety   Post Anesthesia Home Care Instructions  Activity: Get plenty of rest for the remainder of the day. A responsible individual must stay with you for 24 hours following the procedure.  For the next 24 hours, DO NOT: -Drive a car -Social worker -Drink alcoholic beverages -Take any medication unless instructed by your physician -Make any legal decisions or sign important papers.  Meals: Start with liquid foods such as gelatin or soup. Progress to regular foods as tolerated. Avoid greasy, spicy, heavy foods. If nausea and/or vomiting occur, drink only clear liquids until the nausea and/or vomiting subsides. Call your physician if vomiting continues.  Special Instructions/Symptoms: Your throat may feel dry or sore from the anesthesia or the breathing tube placed in your throat during surgery. If this causes discomfort, gargle with warm salt water. The discomfort should disappear within 24 hours.  If you had a scopolamine patch placed behind your ear for the management of post- operative nausea and/or vomiting:  1. The medication in the patch is effective for 72 hours, after which it should be removed.  Wrap patch in a tissue and discard in the trash. Wash hands thoroughly with soap and water. 2. You may remove the patch earlier than 72 hours if you experience unpleasant side effects which may include dry mouth, dizziness  or visual disturbances. 3. Avoid touching the patch. Wash your hands with soap and water after contact with the patch.     Regional Anesthesia Blocks  1. Numbness or the inability to move the "blocked" extremity may last from 3-48 hours after placement. The length of time depends on the medication injected and your individual response to the medication. If the numbness is not going away after 48 hours, call your surgeon.  2. The extremity that is blocked will need to be protected until the numbness is gone and the  Strength has returned. Because you cannot feel it, you will need to take extra care to avoid injury. Because it may be weak, you may have difficulty moving it or using it. You may not know what position it is in without looking at it while the block is in effect.  3. For blocks in the legs and  feet, returning to weight bearing and walking needs to be done carefully. You will need to wait until the numbness is entirely gone and the strength has returned. You should be able to move your leg and foot normally before you try and bear weight or walk. You will need someone to be with you when you first try to ensure you do not fall and possibly risk injury.  4. Bruising and tenderness at the needle site are common side effects and will resolve in a few days.  5. Persistent numbness or new problems with movement should be communicated to the surgeon or the [facility] [phone] [facility] [phone]).

## 2022-06-29 NOTE — Transfer of Care (Signed)
Immediate Anesthesia Transfer of Care Note  Patient: Jill Walsh  Procedure(s) Performed: BREAST RECONSTRUCTION WITH PLACEMENT OF TISSUE EXPANDER AND FLEX HD (ACELLULAR HYDRATED DERMIS) (Bilateral: Breast)  Patient Location: PACU  Anesthesia Type:General  Level of Consciousness: awake, alert , and oriented  Airway & Oxygen Therapy: Patient Spontanous Breathing and Patient connected to face mask oxygen  Post-op Assessment: Report given to RN and Post -op Vital signs reviewed and stable  Post vital signs: Reviewed and stable  Last Vitals:  Vitals Value Taken Time  BP    Temp    Pulse 93 06/29/22 1432  Resp    SpO2 95 % 06/29/22 1432  Vitals shown include unvalidated device data.  Last Pain:  Vitals:   06/29/22 0907  TempSrc: Oral  PainSc: 0-No pain         Complications: No notable events documented.

## 2022-06-30 ENCOUNTER — Encounter (HOSPITAL_BASED_OUTPATIENT_CLINIC_OR_DEPARTMENT_OTHER): Payer: Self-pay | Admitting: Plastic Surgery

## 2022-06-30 ENCOUNTER — Telehealth: Payer: Self-pay | Admitting: Plastic Surgery

## 2022-06-30 NOTE — Telephone Encounter (Signed)
I spoke with the patient's pharmacy.  Patient did not pick up any of the medications sent in at her preop.  Patient states that they will be able to fill them and that they should be ready for the patient to pick up today.  I called to let the patient know this.  I discussed with her that she should take the antibiotic for 5 days.  I told her she can take the Percocet as needed for pain and the Zofran as needed for nausea and vomiting.  Patient expressed understanding.

## 2022-06-30 NOTE — Telephone Encounter (Signed)
Pt called asking for more meds, she stated she only received muscle relaxer's. I spoke with Dr Ladona Ridgel and PA-C Caroline More. She had meds sent to pharmacy on her PreOp but it had expired due to not picking up. They will write new script for her. If she is still in a lot of pain tomorrow, provider said we could book her an apt to come in and see a PA if she needed to. This is just for documentation.

## 2022-07-02 ENCOUNTER — Encounter: Payer: Self-pay | Admitting: Surgical

## 2022-07-02 ENCOUNTER — Ambulatory Visit (INDEPENDENT_AMBULATORY_CARE_PROVIDER_SITE_OTHER): Payer: 59 | Admitting: Surgical

## 2022-07-02 VITALS — BP 130/84 | HR 77 | Temp 98.2°F

## 2022-07-02 DIAGNOSIS — C50011 Malignant neoplasm of nipple and areola, right female breast: Secondary | ICD-10-CM

## 2022-07-02 DIAGNOSIS — Z9889 Other specified postprocedural states: Secondary | ICD-10-CM

## 2022-07-02 DIAGNOSIS — L905 Scar conditions and fibrosis of skin: Secondary | ICD-10-CM

## 2022-07-02 MED ORDER — CYCLOBENZAPRINE HCL 10 MG PO TABS: 10.0000 mg | ORAL_TABLET | Freq: Three times a day (TID) | ORAL | 0 refills | Status: AC | PRN

## 2022-07-02 NOTE — Progress Notes (Signed)
Patient is a 52 year old female here for evaluation after delayed bilateral breast reconstruction placement tissue expander and Flex HD with Dr. Ladona Ridgel on 06/29/2022.  Intraoperatively she had 375 cc tissue expander placed bilaterally.  No fluid was placed in the expander.  Patient reports that she is not having any infectious symptoms.  She is not having any fevers or chills.  She does report she is having a lot of pain.  When inquiring about her pain management, she reports she is only taking oxycodone when her pain levels increase.  She is not doing Tylenol or ibuprofen at this time.  She has been using Robaxin for muscle spasms but reports that has not been helpful at all.  She is here with her husband.  Chaperone present on exam  BP 130/84 (BP Location: Left Arm, Patient Position: Sitting, Cuff Size: Large)   Pulse 77   Temp 98.2 F (36.8 C)   LMP 04/22/2019   SpO2 96%  On exam patient is well-developed, well-nourished, no acute distress. Breathing is unlabored.  No lower extremity swelling or edema is noted.  Bilateral mastectomy flap incisions are intact and healing well.  There is no subcutaneous fluid collection noted palpation.  The skin flaps are viable.  Bilateral JP drains are in place with serosanguineous drainage in bulb.  There is no erythema or cellulitic changes noted.  No ecchymosis noted.  She does have some mild tenderness with palpation.  A/P:  Recommend continue with compressive garments, avoid strenuous activities or heavy lifting.  I discussed with patient she needs to take ibuprofen and Tylenol throughout the day to control baseline pain levels, discussed specific recommendations for dosing and timing.  I also recommended using oxycodone periodically for breakthrough pain.  Since the Robaxin is not working well for her, we will try some Flexeril over the weekend to see if that helps with the muscle spasms.  There is no signs of infection or concern on exam at this  time, encouraged patient to continue to ambulate and drink plenty of fluids.  Recommend following up next week at her scheduled appointment.

## 2022-07-07 ENCOUNTER — Ambulatory Visit (INDEPENDENT_AMBULATORY_CARE_PROVIDER_SITE_OTHER): Payer: 59 | Admitting: Plastic Surgery

## 2022-07-07 ENCOUNTER — Encounter: Payer: Self-pay | Admitting: Plastic Surgery

## 2022-07-07 DIAGNOSIS — C50012 Malignant neoplasm of nipple and areola, left female breast: Secondary | ICD-10-CM

## 2022-07-07 DIAGNOSIS — L905 Scar conditions and fibrosis of skin: Secondary | ICD-10-CM

## 2022-07-07 DIAGNOSIS — Z9889 Other specified postprocedural states: Secondary | ICD-10-CM

## 2022-07-07 MED ORDER — CYCLOBENZAPRINE HCL 5 MG PO TABS: 5.0000 mg | ORAL_TABLET | Freq: Three times a day (TID) | ORAL | 1 refills | Status: DC | PRN

## 2022-07-07 MED ORDER — OXYCODONE HCL 5 MG PO TABS: 5.0000 mg | ORAL_TABLET | Freq: Four times a day (QID) | ORAL | 0 refills | Status: AC | PRN

## 2022-07-07 NOTE — Progress Notes (Signed)
Jill Walsh returns today 8 days postop from placement of bilateral tissue expanders.  She is doing well although she still has a significant amount of discomfort.  Her pain is controlled with the oxycodone and Flexeril.  On physical exam the incisions are clean dry and intact there is no erythema overlying the tissue expanders.  Both drains are reported at less than 20 mL per 24 hours.  Drains were removed today without difficulty.  Patient is instructed not to begin showering for 24 hours.  Will refill her medication but she is encouraged to use nonnarcotics for pain control.

## 2022-07-14 ENCOUNTER — Encounter: Payer: Self-pay | Admitting: Surgical

## 2022-07-14 ENCOUNTER — Ambulatory Visit (INDEPENDENT_AMBULATORY_CARE_PROVIDER_SITE_OTHER): Payer: 59 | Admitting: Surgical

## 2022-07-14 VITALS — BP 142/95 | HR 113 | Ht 65.0 in | Wt 196.0 lb

## 2022-07-14 DIAGNOSIS — C50011 Malignant neoplasm of nipple and areola, right female breast: Secondary | ICD-10-CM

## 2022-07-14 DIAGNOSIS — Z9889 Other specified postprocedural states: Secondary | ICD-10-CM

## 2022-07-14 MED ORDER — KETOROLAC TROMETHAMINE 10 MG PO TABS: 10.0000 mg | ORAL_TABLET | Freq: Four times a day (QID) | ORAL | 0 refills | Status: AC | PRN

## 2022-07-14 NOTE — Progress Notes (Signed)
Patient is a 52 year old female here for follow-up after delayed breast reconstruction with placement of bilateral breast tissue expanders and Flex HD with Dr. Ladona Ridgel on 06/29/2022.  She is 2 weeks postop.  She was last seen in the office 1 week ago, had JP drains removed.  She is here with her husband today.  She reports that she is doing better, but continues to have some pain of the left breast surgical site.  She reports that she has been using the oxycodone prescribed at her last appointment as well as Tylenol and Motrin.  She reports she has been using the Flexeril as needed, mostly at night for muscle spasms.  She is not having any infectious symptoms.  Chaperone present on exam On exam bilateral breast incisions are intact and appear to be healing well.  There is no erythema or cellulitic changes noted.  There is no subcutaneous fluid collection noted w/ palpation.  I do not appreciate any significant tenderness with palpation.  Bilateral mastectomy flaps are viable and no wounds are noted.  JP drain insertion site wounds are healing well.  A/P:  Recommend continue with compressive garments, avoid strenuous activities and heavy lifting.  Continue with dressings to JP drain insertion site wounds.  There is no signs of infection or concern on exam.  She has good range of motion at this time, no PT referral necessary.  Due to her ongoing pain, will have her try some Toradol.  We discussed avoiding use of any further narcotics at this time.  She knows to not take the Toradol and Motrin.  Patient was agreeable to this as well as her husband.  We did not fill the expanders today, will wait 1 more week.  Recommend calling with any questions or concerns.

## 2022-07-22 ENCOUNTER — Ambulatory Visit (INDEPENDENT_AMBULATORY_CARE_PROVIDER_SITE_OTHER): Payer: 59 | Admitting: Surgical

## 2022-07-22 ENCOUNTER — Encounter: Payer: Self-pay | Admitting: Surgical

## 2022-07-22 VITALS — BP 138/96 | HR 109

## 2022-07-22 DIAGNOSIS — Z9889 Other specified postprocedural states: Secondary | ICD-10-CM

## 2022-07-22 DIAGNOSIS — C50011 Malignant neoplasm of nipple and areola, right female breast: Secondary | ICD-10-CM

## 2022-07-22 MED ORDER — CYCLOBENZAPRINE HCL 5 MG PO TABS: 5.0000 mg | ORAL_TABLET | Freq: Three times a day (TID) | ORAL | 1 refills | Status: DC | PRN

## 2022-07-22 NOTE — Progress Notes (Signed)
Patient is a 52 year old female here for follow-up after delayed breast reconstruction with placement tissue expanders and Flex HD with Dr. Ladona Ridgel on 06/29/2022.  She is just over 3 weeks postop.  She was last seen in the office on 07/14/2022, was overall doing well at that time, continued to have some surgical site pain.  We did not expand at that time due to ongoing pain.  Intraoperatively she had 375 cc tissue expanders placed bilaterally, no fluid was placed in the expanders at that time.  Patient presents today with her husband, she reports she is overall doing well.  She reports pain has been much better over the past week.  She is not having any infectious symptoms.  Chaperone present on exam On exam bilateral mastectomy incisions are intact and healing well.  There is no erythema or cellulitic changes noted.  There is no subcutaneous fluid collection noted palpation.  There is minimal tenderness noted with palpation of bilateral surgical sites.  Bilateral NAC's are surgically absent.  A/P:  Patient is doing well after delayed breast reconstruction with tissue expander placement about 3 weeks ago.  She is comfortable moving forward with initial expansion today.  She was also evaluated by Dr. Ladona Ridgel who is in agreement with plan.  We placed injectable saline in the Expander using a sterile technique: Right: 50 cc for a total of 50 / 375 cc Left: 50 cc for a total of 50 / 375 cc  She is scheduled to return to work on July 2, due to her ongoing pain, will have her return to work at this time, however on light duty with no lifting greater than 20 pounds.  We will plan to see her back right before this to determine if any adjustments need to be made.  Given her significant pain after surgery, particularly with the left side, we will continue to monitor her symptoms and determine any changes that need to be made related to work status.  No signs infection or concern on exam today.  We will see her  back in approximately 10 days.

## 2022-08-05 ENCOUNTER — Encounter: Payer: Self-pay | Admitting: Surgical

## 2022-08-05 ENCOUNTER — Ambulatory Visit (INDEPENDENT_AMBULATORY_CARE_PROVIDER_SITE_OTHER): Payer: 59 | Admitting: Surgical

## 2022-08-05 VITALS — BP 133/82

## 2022-08-05 DIAGNOSIS — C50011 Malignant neoplasm of nipple and areola, right female breast: Secondary | ICD-10-CM

## 2022-08-05 DIAGNOSIS — Z9889 Other specified postprocedural states: Secondary | ICD-10-CM

## 2022-08-05 NOTE — Progress Notes (Signed)
Patient is a 52 year old female here for follow-up on delayed bilateral breast reconstruction.  She reports overall she is doing well, tolerated her last fill well without any issues. She is ready for an additional fill today.  She is scheduled to return to work next week, but does not feel comfortable returning yet due to lifting requirements at her job.  Chaperone present on exam On exam bilateral mastectomy incisions are intact and healing well.  No erythema or cellulitic changes noted.  No obvious subcutaneous fluid collection noted palpation.  A/P:  Patient is doing well after delayed breast reconstruction with tissue expanders bilaterally with Dr. Ladona Ridgel on 06/29/2022.  She is just shy of 6 weeks postop.  We placed injectable saline in the Expander using a sterile technique: Right: 50 cc for a total of 100 / 375 cc Left: 60 cc for a total of 110 / 375 cc  Will provide patient with letter today having her remain out of work for 1 additional week, returning on July 8 with restrictions of no lifting greater than 20 pounds.  We will refer her to PT for improvement in strengthening and range of motion.  She is overall doing well with range of motion, but think she could benefit from strengthening.  She prefers Forensic scientist area.  No signs infection or concern on exam.  Will see her back in 2 weeks.

## 2022-08-06 ENCOUNTER — Encounter: Payer: 59 | Admitting: Surgical

## 2022-08-09 ENCOUNTER — Telehealth: Payer: Self-pay | Admitting: *Deleted

## 2022-08-09 NOTE — Telephone Encounter (Signed)
Patient called in stating needed more information on return to work note including why it was extended to 7/8 and restriction details. High priority message sent to Matt-PA

## 2022-08-10 ENCOUNTER — Encounter: Payer: Self-pay | Admitting: Surgical

## 2022-08-10 ENCOUNTER — Telehealth: Payer: Self-pay

## 2022-08-10 NOTE — Telephone Encounter (Signed)
Patient verbalized her work note is not sufficient for her job to pay her.   Patient stated they are wanting on the work note what did the doctor do and why.  Please follow up with patient please.

## 2022-08-18 ENCOUNTER — Encounter: Payer: Self-pay | Admitting: Surgical

## 2022-08-18 ENCOUNTER — Ambulatory Visit (INDEPENDENT_AMBULATORY_CARE_PROVIDER_SITE_OTHER): Payer: 59 | Admitting: Surgical

## 2022-08-18 VITALS — BP 149/90 | HR 97

## 2022-08-18 DIAGNOSIS — C50011 Malignant neoplasm of nipple and areola, right female breast: Secondary | ICD-10-CM

## 2022-08-18 DIAGNOSIS — Z9889 Other specified postprocedural states: Secondary | ICD-10-CM

## 2022-08-18 DIAGNOSIS — C50012 Malignant neoplasm of nipple and areola, left female breast: Secondary | ICD-10-CM

## 2022-08-18 MED ORDER — KETOROLAC TROMETHAMINE 10 MG PO TABS: 10.0000 mg | ORAL_TABLET | Freq: Three times a day (TID) | ORAL | 0 refills | Status: AC | PRN

## 2022-08-18 NOTE — Progress Notes (Signed)
Patient is a very pleasant 52 year old female here for follow-up on delayed bilateral breast reconstruction.  She is here today for an additional fill.  She was last here on 08/05/2022.  She presents today with her husband, she reports overall she has been doing okay, reports having some increased pain, particularly in the left side after returning to work.  She reports that she works 12-hour shifts and that has been difficult for her.  She would like some medication for pain control if possible.  She is not having any infectious symptoms.  She was able to be evaluated by Dr. Ladona Ridgel today as well.  Chaperone present on exam On exam bilateral breast tissue expanders are in place and bilateral mastectomy flaps are viable.  There is no subcutaneous fluid collection noted palpation.  There is no erythema or cellulitic changes noted of either breast.  A/P: She was previously referred to PT, but reports she has not received any phone calls in regards to scheduling.  We will check on this for her today.  In regards to her pain, will try Toradol to help with her pain control.  She is aware to use this as needed, but would like to avoid using for extended period of time.  We discussed not using ibuprofen or any other NSAIDs while taking Toradol.  Patient was agreeable to the plan.  We will plan to the patient back in 2 weeks for reevaluation.  We placed injectable saline in the Expander using a sterile technique: Right: 50 cc for a total of 150 / 375 cc Left: 50 cc for a total of 160 / 375 cc

## 2022-09-02 ENCOUNTER — Encounter: Payer: Self-pay | Admitting: Student

## 2022-09-02 ENCOUNTER — Ambulatory Visit (INDEPENDENT_AMBULATORY_CARE_PROVIDER_SITE_OTHER): Payer: 59 | Admitting: Student

## 2022-09-02 VITALS — BP 155/89 | HR 97

## 2022-09-02 DIAGNOSIS — Z9889 Other specified postprocedural states: Secondary | ICD-10-CM

## 2022-09-02 NOTE — Progress Notes (Signed)
Patient is a 52 year old female who underwent delayed reconstruction with bilateral tissue expander placement with Dr. Ladona Ridgel on 06/29/2022.  She presents to the clinic today for postoperative follow-up.  Patient was last seen in the clinic on 08/18/2022.  At this visit, patient presented for an additional fill.  She reported that she overall was doing okay but was having some increased pain, particularly on the left side after working.  On exam, expanders were in place bilaterally and mastectomy flaps are viable.  No fluid collections on exam.  No erythema or cellulitic changes to either breast.  She was previously referred to physical therapy, but did not receive any phone calls in regards to scheduling.  Toradol was also prescribed to help with her pain control.  Plan was for patient to come back in 2 weeks.  She also underwent expander fill, 50 cc on each side for a total of 150 cc / 375 cc on the right, and 160 cc/the 75 cc on the left.  Today, patient presents with husband at bedside.  Patient states that she is doing well.  She states that her pain is improving.  She reports that she just ran out of muscle relaxer which she has needed to take sometimes after work given that she is doing some physical activity at work.  Patient denies any other issues or concerns.  She denies any fevers or chills.  Chaperone present on exam.  On exam, patient is sitting upright in no acute distress.  Expanders are in place bilaterally.  Breasts are soft.  There is a little bit more of excess tissue noted to the right breast.  There is no overlying erythema.  There are no fluid collections palpated on exam.  Incisions appear to be well-healed.  No signs of infection on exam.  We placed injectable saline in the Expander using a sterile technique: Right: 50 cc for a total of 200 cc / 375 cc Left: 50 cc for a total of 210 cc / 375 cc   Discussed with the patient that I will refill muscle relaxer.  I called the patient's  pharmacy, she has 1 more refill left.  They are going to fill this and contact her when it is ready.  Discussed with her to utilize Tylenol and ibuprofen for pain as well.  Patient expressed understanding.  Patient to follow back up in 2 weeks for another expander fill.  I instructed the patient to call in the meantime she has any questions or concerns about anything.

## 2022-09-15 ENCOUNTER — Ambulatory Visit (INDEPENDENT_AMBULATORY_CARE_PROVIDER_SITE_OTHER): Payer: 59 | Admitting: Surgical

## 2022-09-15 DIAGNOSIS — Z9889 Other specified postprocedural states: Secondary | ICD-10-CM

## 2022-09-15 NOTE — Progress Notes (Signed)
Patient is a 52 year old female here for follow-up on her delayed bilateral breast reconstruction.  She currently has tissue expanders in place, these were placed on 06/29/2022.  Patient is 11 weeks postop.  Patient presents today with her husband.  She reports she is doing really well, reports that she tolerated her last expansion well.  She does continue to have pain at night after work.  But this seems to be well-controlled at this point.  Chaperone present on exam On exam bilateral breast incisions are intact and healing well.  Bilateral tissue expanders are in place, no subcutaneous fluid collection noted.  There is no erythema or cellulitis changes noted.  She does have some asymmetry noted, right side is larger and has additional mastectomy flap skin noted.  A/P:  Patient is overall doing well with expansion, she would like an additional expansion today.  There is no signs of infection or concern on exam.  Recommend following up in 2 weeks for reevaluation.  We placed injectable saline in the Expander using a sterile technique: Right: 50 cc for a total of 250 / 375 cc Left: 60 cc for a total of 270 / 375 cc

## 2022-09-29 ENCOUNTER — Ambulatory Visit (INDEPENDENT_AMBULATORY_CARE_PROVIDER_SITE_OTHER): Payer: 59 | Admitting: Surgical

## 2022-09-29 ENCOUNTER — Encounter: Payer: Self-pay | Admitting: Surgical

## 2022-09-29 VITALS — BP 156/90 | HR 92 | Ht 65.0 in | Wt 193.0 lb

## 2022-09-29 DIAGNOSIS — Z9889 Other specified postprocedural states: Secondary | ICD-10-CM

## 2022-09-29 NOTE — Progress Notes (Signed)
52 year old female here for follow-up on her delayed bilateral breast reconstruction.  She has tissue expanders in place.  The left side has been much tighter and smaller than the right side due to the excess skin previously being excised prior to her reconstruction.  We have slowly been expanding and she has been tolerating this well.  She tolerated her last expansion well without any issues.  On exam breast incisions are intact and healing well.  No subcutaneous fluid collection or erythema noted. Asymmetry is noted.  Left smaller than right.  A/P:  We placed injectable saline in the Expander using a sterile technique: Right: 0 cc for a total of 250 / 375 cc Left: 60 cc for a total of 330 / 375 cc  No signs of infection, follow-up in 2 weeks.  We will continue to expand on the left side to improve symmetry.

## 2022-10-01 ENCOUNTER — Ambulatory Visit: Payer: 59 | Attending: Surgical

## 2022-10-01 DIAGNOSIS — M79602 Pain in left arm: Secondary | ICD-10-CM | POA: Insufficient documentation

## 2022-10-01 DIAGNOSIS — C50012 Malignant neoplasm of nipple and areola, left female breast: Secondary | ICD-10-CM | POA: Insufficient documentation

## 2022-10-01 DIAGNOSIS — Z9889 Other specified postprocedural states: Secondary | ICD-10-CM | POA: Insufficient documentation

## 2022-10-01 DIAGNOSIS — R293 Abnormal posture: Secondary | ICD-10-CM | POA: Insufficient documentation

## 2022-10-01 DIAGNOSIS — C50011 Malignant neoplasm of nipple and areola, right female breast: Secondary | ICD-10-CM | POA: Insufficient documentation

## 2022-10-01 DIAGNOSIS — M25612 Stiffness of left shoulder, not elsewhere classified: Secondary | ICD-10-CM | POA: Insufficient documentation

## 2022-10-04 ENCOUNTER — Ambulatory Visit: Payer: 59

## 2022-10-06 ENCOUNTER — Ambulatory Visit: Payer: 59

## 2022-10-06 DIAGNOSIS — Z9889 Other specified postprocedural states: Secondary | ICD-10-CM

## 2022-10-06 DIAGNOSIS — R293 Abnormal posture: Secondary | ICD-10-CM | POA: Diagnosis present

## 2022-10-06 DIAGNOSIS — M79602 Pain in left arm: Secondary | ICD-10-CM

## 2022-10-06 DIAGNOSIS — M25612 Stiffness of left shoulder, not elsewhere classified: Secondary | ICD-10-CM | POA: Diagnosis present

## 2022-10-06 DIAGNOSIS — C50012 Malignant neoplasm of nipple and areola, left female breast: Secondary | ICD-10-CM | POA: Diagnosis not present

## 2022-10-06 DIAGNOSIS — C50011 Malignant neoplasm of nipple and areola, right female breast: Secondary | ICD-10-CM | POA: Diagnosis not present

## 2022-10-06 NOTE — Therapy (Signed)
OUTPATIENT PHYSICAL THERAPY SHOULDER EVALUATION   Patient Name: Jill Walsh MRN: [id number] DOB:[birth year], 52 y.o., female Today's Date: 10/07/2022  END OF SESSION:  PT End of Session - 10/06/22 2102     Visit Number 1    Number of Visits 17    Date for PT Re-Evaluation 12/02/22    Authorization Time Period (Re-cert needed 40/98/11)    PT Start Time 1030    PT Stop Time 1115    PT Time Calculation (min) 45 min    Activity Tolerance Patient tolerated treatment well    Behavior During Therapy [facility] for tasks assessed/performed             Past Medical History:  Diagnosis Date   Asthma    Breast cancer (HCC) 2016   RT MASTECTOMY   Past Surgical History:  Procedure Laterality Date   BREAST BIOPSY Left    02/28/14 negative   BREAST BIOPSY Right     02/2014 malignant   BREAST RECONSTRUCTION WITH PLACEMENT OF TISSUE EXPANDER AND FLEX HD (ACELLULAR HYDRATED DERMIS) Bilateral 06/29/2022   Procedure: BREAST RECONSTRUCTION WITH PLACEMENT OF TISSUE EXPANDER AND FLEX HD (ACELLULAR HYDRATED DERMIS);  Surgeon: Santiago Glad, MD;  Location: [facility];  Service: Plastics;  Laterality: Bilateral;   COLONOSCOPY WITH PROPOFOL N/A 11/13/2018   Procedure: COLONOSCOPY WITH PROPOFOL;  Surgeon: Wyline Mood, MD;  Location: [facility];  Service: Gastroenterology;  Laterality: N/A;   MASTECTOMY Right 2016   BREAST CA   TOTAL MASTECTOMY Left 05/29/2020   Procedure: TOTAL MASTECTOMY;  Surgeon: Sung Amabile, DO;  Location: [facility];  Service: General;  Laterality: Left;   TUBAL LIGATION     Patient Active Problem List   Diagnosis Date Noted   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 Elizabeth Sauer, MD  REFERRING PROVIDER: Keenan Bachelor, PA-C  REFERRING DIAG: s/p breast reconstruction b/l with tissue expander   THERAPY DIAG:  Decreased range of motion of left shoulder  Pain in left arm  Abnormal posture  S/P breast reconstruction, bilateral  Rationale for Evaluation and Treatment: Rehabilitation  ONSET DATE: Jun 29, 2022  SUBJECTIVE:  SUBJECTIVE STATEMENT: Pt's primary concern is: pain/burning/stiffness in L >R shoulder area which is limiting her ability to use her L UE for ADL, functional activities, and work activities  Pt reports undergoing b/l breast reconstruction surgery in May 2024 after her h/o breast cancer and b/l mastectomy.  Pt currently has tissue expanders and is going every 2 weeks for follow up for the expansion/fill process in preparation for implant placement.  Pt has f/u next Thursday with her surgeon.  Pt has a supportive husband during this process.  Just moved into a new house in 2002.  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  She reports she is currently on lifting restrictions at her job- not lifting >15-20 lbs  Activity tolerance: having difficulty with lifting, reaching, lifting, pushing herself up from the tub, pushing her body for transfers in bed; she sits with pillow under her arm when she has to drive; difficulty sleeping sometimes  Aggravating factors for pain: lifting, reaching pushing, sleeping in certain positions   Alleviating factors for pain: muscle relaxers, change positions   Hobbies: shopping, hanging out with family, walking her dog (little yorkie "Diamond")   Hand dominance: Left  PERTINENT HISTORY: Pt reports h/o breast cancer- L in 2022, R  in 2016 Pt has h/l b/l mastectomy Reports she did not have  to undergo additional radiation, chemotherapy, medication tx for breast cancer; reports she did not experience lymphedema; and she has regular follow ups with physician/medical team  PAIN:  Are you having pain? Yes, best 0/10, worst 10/10, current 3-4/10; describes as pain, achy, sometimes burning/tingling  PRECAUTIONS: Other: lifting precautions: no lifting >15-20 lbs  RED FLAGS: None   WEIGHT BEARING RESTRICTIONS: No  FALLS:  Has patient fallen in last 6 months? 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  PLOF: Independent  PATIENT GOALS:to be able to use her L arm for all activities without being limited by pain  NEXT MD VISIT: 10/14/22  OBJECTIVE:   DIAGNOSTIC FINDINGS:  Pt has regular follow ups with her medical care team  PATIENT SURVEYS:  FOTO 51/70  COGNITION: Overall cognitive status: Within functional limits for tasks assessed     SENSATION: WFL  POSTURE: Rounded forward shoulders  UPPER EXTREMITY ROM:   Active ROM Right eval Left eval  Shoulder flexion 165 125 *  Shoulder extension    Shoulder abduction 177 120*  Shoulder adduction    Shoulder internal rotation HBB thumb to T7 HBB thumb to T9  Shoulder external rotation HBH normal HBH with L cervical lateral flexion substitution  Elbow flexion normal normal  Elbow extension normal normal  Wrist flexion    Wrist extension    Wrist ulnar deviation    Wrist radial deviation    Wrist pronation    Wrist supination    (Blank rows = not tested)  UPPER EXTREMITY MMT:  MMT Right eval Left eval  Shoulder flexion 5 4  Shoulder extension    Shoulder abduction 5 4  Shoulder adduction    Shoulder internal rotation 5 4  Shoulder external rotation 5 4  Middle trapezius    Lower trapezius    Elbow flexion    Elbow extension    Wrist flexion    Wrist extension     Wrist ulnar deviation    Wrist radial deviation    Wrist pronation    Wrist supination  Grip strength (lbs)    (Blank rows = not tested)   JOINT MOBILITY TESTING:  PROM L shoulder limited same as AROM, end feel is soft and not firm feeling- limited more by pain and soft tissue tightness rather than capsular end feel; no pain with GH inf glide, slightly hypomobile  PALPATION:  Soft tissue hypomobility/tightness along L>R  anterior/lateral ribcage, latissimus dorsi, subscap, pec major/minor; b/l scar healing well and (-) hypersensitive to touch (observation and palpation of incision sites on anterior breast performed with pt verbal consent)   TODAY'S TREATMENT:                                                                                                                                         DATE: 10/06/22 Initial evaluation today PT emailed initial HEP and encouraged pt to continue with her current shoulder AROM HEP she has at home   PATIENT EDUCATION: Education details: discussed PT POC/goals Person educated: Patient Education method: Explanation and Handouts Education comprehension: verbalized understanding  HOME EXERCISE PROGRAM: Emailed pt medbridge HEP, will review and discuss at next visit  ASSESSMENT:  CLINICAL IMPRESSION: Patient is a 52 y.o. F who was seen today for physical therapy evaluation and treatment for pain and decreased L>R shoulder ROM and strength after undergoing b/l breast reconstruction surgery 06/29/22 with tissue expander placement and fill process in preparation for breast Implant placement after her history of breast cancer.  OBJECTIVE IMPAIRMENTS: decreased mobility, decreased ROM, decreased strength, increased fascial restrictions, impaired perceived functional ability, impaired UE functional use, postural dysfunction, and pain.   ACTIVITY LIMITATIONS: carrying, lifting, sleeping, transfers, bathing, reach over head, and  hygiene/grooming  PARTICIPATION LIMITATIONS: cleaning, community activity, and occupation  PERSONAL FACTORS: Past/current experiences and Time since onset of injury/illness/exacerbation are also affecting patient's functional outcome.   REHAB POTENTIAL: Excellent  CLINICAL DECISION MAKING: Stable/uncomplicated  EVALUATION COMPLEXITY: Low   GOALS: Goals reviewed with patient? Yes  SHORT TERM GOALS: Target date: 11/09/22  Pt will be able to demonstrate safe and independent transfers from supine to sit and sitting in tub to stand without being limited by L shoulder/upper quadrant pain Baseline: requires assistance from spouse sometimes Goal status: INITIAL   LONG TERM GOALS: Target date: 12/02/22  Improve L shoulder AROM to >150 degrees flexion and abduction to facilitate being able to reach and lift items overhead from a shelf at work or at home Baseline: L flexion 125, Abd 120 deg Goal status: INITIAL  2.  Improve L shoulder strength by >1/2 MMT grade to facilitate improved ability to lift/carry 20 lbs at home at at work for her job fulfilling orders of medical supplies Baseline: lifting restrictions set to <15-20 lbs currently Goal status: INITIAL  3.  Improve FOTO to >70 indicating pt is able to perform her ADLs, activities at home, and activities at work without being limited by L shoulder pain, stiffness, and  weakness Baseline: 51 at initial evaluation (predicted 70 at DC) Goal status: INITIAL   PLAN:  PT FREQUENCY: 2x/week  PT DURATION: 8 weeks  PLANNED INTERVENTIONS: Therapeutic exercises, Therapeutic activity, Neuromuscular re-education, Balance training, Gait training, Patient/Family education, Self Care, and Joint mobilization  PLAN FOR NEXT SESSION: L>R shoulder PROM/AROM, stretching, manual therapy  Max Fickle, PT, DPT, OCS Ardine Bjork, PT 10/07/2022, 9:08 PM

## 2022-10-13 ENCOUNTER — Ambulatory Visit: Payer: 59

## 2022-10-14 ENCOUNTER — Encounter: Payer: 59 | Admitting: Surgical

## 2022-10-20 ENCOUNTER — Ambulatory Visit: Payer: 59 | Attending: Surgical

## 2022-10-20 DIAGNOSIS — M25612 Stiffness of left shoulder, not elsewhere classified: Secondary | ICD-10-CM | POA: Insufficient documentation

## 2022-10-20 DIAGNOSIS — M79602 Pain in left arm: Secondary | ICD-10-CM | POA: Diagnosis present

## 2022-10-20 NOTE — Therapy (Signed)
OUTPATIENT PHYSICAL THERAPY SHOULDER EVALUATION   Patient Name: Jill Walsh MRN: [id number] DOB:1970-10-13, 52 y.o., female Today's Date: 10/21/2022  END OF SESSION:  PT End of Session - 10/20/22 1028     Visit Number 2    Number of Visits 17    Date for PT Re-Evaluation 12/02/22    Authorization Time Period (Re-cert needed 41/32/44)    PT Start Time 1030    PT Stop Time 1115    PT Time Calculation (min) 45 min    Activity Tolerance Patient tolerated treatment well    Behavior During Therapy [facility] for tasks assessed/performed             Past Medical History:  Diagnosis Date   Asthma    Breast cancer (HCC) 2016   RT MASTECTOMY   Past Surgical History:  Procedure Laterality Date   BREAST BIOPSY Left    02/28/14 negative   BREAST BIOPSY Right     02/2014 malignant   BREAST RECONSTRUCTION WITH PLACEMENT OF TISSUE EXPANDER AND FLEX HD (ACELLULAR HYDRATED DERMIS) Bilateral 06/29/2022   Procedure: BREAST RECONSTRUCTION WITH PLACEMENT OF TISSUE EXPANDER AND FLEX HD (ACELLULAR HYDRATED DERMIS);  Surgeon: Santiago Glad, MD;  Location: [facility];  Service: Plastics;  Laterality: Bilateral;   COLONOSCOPY WITH PROPOFOL N/A 11/13/2018   Procedure: COLONOSCOPY WITH PROPOFOL;  Surgeon: Wyline Mood, MD;  Location: [facility];  Service: Gastroenterology;  Laterality: N/A;   MASTECTOMY Right 2016   BREAST CA   TOTAL MASTECTOMY Left 05/29/2020   Procedure: TOTAL MASTECTOMY;  Surgeon: Sung Amabile, DO;  Location: [facility];  Service: General;  Laterality: Left;   TUBAL LIGATION     Patient Active Problem List   Diagnosis Date Noted   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 Elizabeth Sauer, MD  REFERRING PROVIDER: Keenan Bachelor, PA-C  REFERRING DIAG: s/p breast reconstruction b/l with tissue expander   THERAPY DIAG:  Decreased range of motion of left shoulder  Pain in left arm  Rationale for Evaluation and Treatment: Rehabilitation  ONSET DATE: Jun 29, 2022  SUBJECTIVE:          (From initial evaluation note)  SUBJECTIVE STATEMENT: Pt's primary concern is: pain/burning/stiffness in L >R shoulder area which is limiting her ability to use her L UE for ADL, functional activities, and work activities  Pt reports undergoing b/l breast reconstruction surgery in May 2024 after her h/o breast cancer and b/l mastectomy.  Pt currently has tissue expanders and is going every 2 weeks for follow up for the expansion/fill process in preparation for implant placement.  Pt has f/u next Thursday with her surgeon.  Pt has a supportive husband during this process.  Just moved into a new house in 2002.  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  She reports she is currently on lifting restrictions at her job- not lifting >15-20 lbs  Activity tolerance: having difficulty with lifting, reaching, lifting, pushing herself up from the tub, pushing her body for transfers in bed; she sits with pillow under her arm when she has to drive; difficulty sleeping sometimes  Aggravating factors for pain: lifting, reaching pushing, sleeping in certain positions   Alleviating factors for pain: muscle relaxers, change positions   Hobbies: shopping, hanging out with family, walking her dog (little yorkie "Diamond")   Hand dominance: Left  PERTINENT HISTORY: Pt reports h/o breast cancer- L in 2022, R  in 2016 Pt has h/l b/l mastectomy Reports she did not have to undergo additional  radiation, chemotherapy, medication tx for breast cancer; reports she did not experience lymphedema; and she has regular follow ups with physician/medical team  PAIN:  Are you having pain? Yes, best 0/10, worst 10/10, current 3-4/10; describes as pain, achy, sometimes burning/tingling  PRECAUTIONS: Other: lifting precautions: no lifting >15-20 lbs  RED FLAGS: None   WEIGHT BEARING RESTRICTIONS: No  FALLS:  Has patient fallen in last 6 months? 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  PLOF: Independent  PATIENT GOALS:to be able to use her L arm for all activities without being limited by pain  NEXT MD VISIT: 10/14/22  OBJECTIVE:   DIAGNOSTIC FINDINGS:  Pt has regular follow ups with her medical care team  PATIENT SURVEYS:  FOTO 51/70  COGNITION: Overall cognitive status: Within functional limits for tasks assessed     SENSATION: WFL  POSTURE: Rounded forward shoulders  UPPER EXTREMITY ROM:   Active ROM Right eval Left eval  Shoulder flexion 165 125 *  Shoulder extension    Shoulder abduction 177 120*  Shoulder adduction    Shoulder internal rotation HBB thumb to T7 HBB thumb to T9  Shoulder external rotation HBH normal HBH with L cervical lateral flexion substitution  Elbow flexion normal normal  Elbow extension normal normal  Wrist flexion    Wrist extension    Wrist ulnar deviation    Wrist radial deviation    Wrist pronation    Wrist supination    (Blank rows = not tested)  UPPER EXTREMITY MMT:  MMT Right eval Left eval  Shoulder flexion 5 4  Shoulder extension    Shoulder abduction 5 4  Shoulder adduction    Shoulder internal rotation 5 4  Shoulder external rotation 5 4  Middle trapezius    Lower trapezius    Elbow flexion    Elbow extension    Wrist flexion    Wrist extension    Wrist ulnar deviation     Wrist radial deviation    Wrist pronation    Wrist supination  Grip strength (lbs)    (Blank rows = not tested)   JOINT MOBILITY TESTING:  PROM L shoulder limited same as AROM, end feel is soft and not firm feeling- limited more by pain and soft tissue tightness rather than capsular end feel; no pain with GH inf glide, slightly hypomobile  PALPATION:  Soft tissue hypomobility/tightness along L>R  anterior/lateral ribcage, latissimus dorsi, subscap, pec major/minor; b/l scar healing well and (-) hypersensitive to touch (observation and palpation of incision sites on anterior breast performed with pt verbal consent)   TODAY'S TREATMENT:                                                                                                                                         DATE: 10/20/22 Subjective: Pt is having her next fill tomorrow.  Her L shoulder is sore with use- overhead/reaching motions.  Pain: currently 1/10  Objective: Manual Therapy: (20 min) Manual pec stretch in supine hooklying: 30 second intervals Manual GH jt inf glide: gr II/III, 30 second intervals in various positions of flexion and abd PROM flexion, abd, ER, IR    Therapeutic Exercises:( 25 min) Diaphragmatic breathing x 3 min Shoulder flexion AAROM seated table slides:5x 20 sec Shoulder abd  AAROM seated table slides: 5x20 sec Seated shoulder ER AAROM at table 5x 20 seconds Pec stretch in doorway: 5 x 20 sec  Reviewed HEP: Access Code: [email or web address] URL: [email or web address] Date: 10/20/2022 Prepared by: Max Fickle  Exercises - Seated Shoulder Flexion Towel Slide at Table Top  - 1-2 x daily - 7 x weekly - 5 reps - 20 hold - Seated Shoulder Abduction Towel Slide at Table Top  - 1-2 x daily - 7 x weekly - 5 reps - 20 hold - Seated Shoulder External Rotation PROM on Table  - 1-2 x daily - 7 x weekly - 5 reps - 20 hold - Diaphragmatic Breathing at 90/90 Supported  - 1 x daily - 7 x weekly - 2  minutes of breathing slowly hold - Standing Scapular Retraction  - 1 x daily - 7 x weekly - 2 sets - 10 reps - Doorway Pec Stretch at 60 Elevation  - 1 x daily - 7 x weekly - 5 reps - 10 hold  PATIENT EDUCATION: Education details: discussed PT POC/goals, HEP Person educated: Patient Education method: Explanation and Handouts Education comprehension: verbalized understanding  HOME EXERCISE PROGRAM: Access Code: [email or web address]  ASSESSMENT:  CLINICAL IMPRESSION: Patient tolerated manual therapy and therapeutic exercises for L shoulder pain/decreased ROM well.  End feel with GH joint PROM did not feel capsular, seems mostly limited to pain/muscle guarding/soft tissue restrictions vs capsule hypomobility. Should continue to benefit from skilled PT to address impairments listed below including pain and decreased L>R shoulder ROM and strength after undergoing b/l breast reconstruction surgery 06/29/22 with tissue expander placement and fill process in preparation for breast Implant placement after  her history of breast cancer.  OBJECTIVE IMPAIRMENTS: decreased mobility, decreased ROM, decreased strength, increased fascial restrictions, impaired perceived functional ability, impaired UE functional use, postural dysfunction, and pain.   ACTIVITY LIMITATIONS: carrying, lifting, sleeping, transfers, bathing, reach over head, and hygiene/grooming  PARTICIPATION LIMITATIONS: cleaning, community activity, and occupation  PERSONAL FACTORS: Past/current experiences and Time since onset of injury/illness/exacerbation are also affecting patient's functional outcome.   REHAB POTENTIAL: Excellent  CLINICAL DECISION MAKING: Stable/uncomplicated  EVALUATION COMPLEXITY: Low   GOALS: Goals reviewed with patient? Yes  SHORT TERM GOALS: Target date: 11/09/22  Pt will be able to demonstrate safe and independent transfers from supine to sit and sitting in tub to stand without being limited by L shoulder/upper  quadrant pain Baseline: requires assistance from spouse sometimes Goal status: INITIAL   LONG TERM GOALS: Target date: 12/02/22  Improve L shoulder AROM to >150 degrees flexion and abduction to facilitate being able to reach and lift items overhead from a shelf at work or at home Baseline: L flexion 125, Abd 120 deg Goal status: INITIAL  2.  Improve L shoulder strength by >1/2 MMT grade to facilitate improved ability to lift/carry 20 lbs at home at at work for her job fulfilling orders of medical supplies Baseline: lifting restrictions set to <15-20 lbs currently Goal status: INITIAL  3.  Improve FOTO to >70 indicating pt is able to perform her ADLs, activities at home, and activities at work without being limited by L shoulder pain, stiffness, and weakness Baseline: 51 at initial evaluation (predicted 70 at DC) Goal status: INITIAL   PLAN:  PT FREQUENCY: 2x/week  PT DURATION: 8 weeks  PLANNED INTERVENTIONS: Therapeutic exercises, Therapeutic activity, Neuromuscular re-education, Balance training, Gait training, Patient/Family education, Self Care, and Joint mobilization  PLAN FOR NEXT SESSION: L>R shoulder PROM/AROM, stretching, manual therapy  Max Fickle, PT, DPT, OCS Ardine Bjork, PT 10/21/2022, 11:50 AM

## 2022-10-21 ENCOUNTER — Ambulatory Visit (INDEPENDENT_AMBULATORY_CARE_PROVIDER_SITE_OTHER): Payer: 59 | Admitting: Surgical

## 2022-10-21 ENCOUNTER — Encounter: Payer: Self-pay | Admitting: Surgical

## 2022-10-21 DIAGNOSIS — Z9889 Other specified postprocedural states: Secondary | ICD-10-CM

## 2022-10-21 DIAGNOSIS — C50011 Malignant neoplasm of nipple and areola, right female breast: Secondary | ICD-10-CM

## 2022-10-21 NOTE — Progress Notes (Signed)
52 year old female here for follow-up on her delayed bilateral breast reconstruction.  She has bilateral breast tissue expanders in place.  The left side is much smaller and tighter due to the left side having a thinner mastectomy and excess skin previously excised.  Patient reports she has been tolerating physical therapy well and thinks it has been very helpful for her.  She reports significant improvement in range of motion.  She reports she needs some updated forms completed for work.  Chaperone present on exam On exam bilateral breast incisions are intact, breast asymmetry is noted with the right 1 having some excess skin and appearing slightly larger. There is no erythema or cellulitic changes.  No subcutaneous fluid collections noted.  A/P:  We placed injectable saline in the Expander using a sterile technique: Right: 50 cc for a total of 300 / 375 cc Left: 50 cc for a total of 380 / 375 cc  Patient is doing well with expansion, we will complete forms for her to remain out of work after expansions due to muscle spasms and tightness.  Recommend following up in 2 weeks with Dr. Ladona Ridgel for reevaluation and to evaluate breast symmetry.

## 2022-10-25 ENCOUNTER — Ambulatory Visit: Payer: 59

## 2022-10-25 DIAGNOSIS — M79602 Pain in left arm: Secondary | ICD-10-CM

## 2022-10-25 DIAGNOSIS — M25612 Stiffness of left shoulder, not elsewhere classified: Secondary | ICD-10-CM | POA: Diagnosis not present

## 2022-10-25 NOTE — Therapy (Signed)
OUTPATIENT PHYSICAL THERAPY SHOULDER EVALUATION   Patient Name: Jill Walsh MRN: [id number] DOB:11-09-1970, 52 y.o., female Today's Date: 10/25/2022  END OF SESSION:  PT End of Session - 10/25/22 1027     Visit Number 3    Number of Visits 17    Date for PT Re-Evaluation 12/02/22    Authorization Time Period (Re-cert needed 40/98/11)    PT Start Time 1030    PT Stop Time 1115    PT Time Calculation (min) 45 min    Activity Tolerance Patient tolerated treatment well    Behavior During Therapy [facility] for tasks assessed/performed             Past Medical History:  Diagnosis Date   Asthma    Breast cancer (HCC) 2016   RT MASTECTOMY   Past Surgical History:  Procedure Laterality Date   BREAST BIOPSY Left    02/28/14 negative   BREAST BIOPSY Right     02/2014 malignant   BREAST RECONSTRUCTION WITH PLACEMENT OF TISSUE EXPANDER AND FLEX HD (ACELLULAR HYDRATED DERMIS) Bilateral 06/29/2022   Procedure: BREAST RECONSTRUCTION WITH PLACEMENT OF TISSUE EXPANDER AND FLEX HD (ACELLULAR HYDRATED DERMIS);  Surgeon: Santiago Glad, MD;  Location: [facility];  Service: Plastics;  Laterality: Bilateral;   COLONOSCOPY WITH PROPOFOL N/A 11/13/2018   Procedure: COLONOSCOPY WITH PROPOFOL;  Surgeon: Wyline Mood, MD;  Location: [facility] ENDOSCOPY;  Service: Gastroenterology;  Laterality: N/A;   MASTECTOMY Right 2016   BREAST CA   TOTAL MASTECTOMY Left 05/29/2020   Procedure: TOTAL MASTECTOMY;  Surgeon: Sung Amabile, DO;  Location: [facility] ORS;  Service: General;  Laterality: Left;   TUBAL LIGATION     Patient Active Problem List   Diagnosis Date Noted   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 Elizabeth Sauer, MD  REFERRING PROVIDER: Keenan Bachelor, PA-C  REFERRING DIAG: s/p breast reconstruction b/l with tissue expander   THERAPY DIAG:  Decreased range of motion of left shoulder  Pain in left arm  Rationale for Evaluation and Treatment: Rehabilitation  ONSET DATE: Jun 29, 2022  SUBJECTIVE:          (From initial evaluation note)  SUBJECTIVE STATEMENT: Pt's primary concern is: pain/burning/stiffness in L >R shoulder area which is limiting her ability to use her L UE for ADL, functional activities, and work activities  Pt reports undergoing b/l breast reconstruction surgery in May 2024 after her h/o breast cancer and b/l mastectomy.  Pt currently has tissue expanders and is going every 2 weeks for follow up for the expansion/fill process in preparation for implant placement.  Pt has f/u next Thursday with her surgeon.  Pt has a supportive husband during this process.  Just moved into a new house in 2002.  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  She reports she is currently on lifting restrictions at her job- not lifting >15-20 lbs  Activity tolerance: having difficulty with lifting, reaching, lifting, pushing herself up from the tub, pushing her body for transfers in bed; she sits with pillow under her arm when she has to drive; difficulty sleeping sometimes  Aggravating factors for pain: lifting, reaching pushing, sleeping in certain positions   Alleviating factors for pain: muscle relaxers, change positions   Hobbies: shopping, hanging out with family, walking her dog (little yorkie "Diamond")   Hand dominance: Left  PERTINENT HISTORY: Pt reports h/o breast cancer- L in 2022, R  in 2016 Pt has h/l b/l mastectomy Reports she did not have to undergo additional  radiation, chemotherapy, medication tx for breast cancer; reports she did not experience lymphedema; and she has regular follow ups with physician/medical team  PAIN:  Are you having pain? Yes, best 0/10, worst 10/10, current 3-4/10; describes as pain, achy, sometimes burning/tingling  PRECAUTIONS: Other: lifting precautions: no lifting >15-20 lbs  RED FLAGS: None   WEIGHT BEARING RESTRICTIONS: No  FALLS:  Has patient fallen in last 6 months? 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  PLOF: Independent  PATIENT GOALS:to be able to use her L arm for all activities without being limited by pain  NEXT MD VISIT: 10/14/22  OBJECTIVE:   DIAGNOSTIC FINDINGS:  Pt has regular follow ups with her medical care team  PATIENT SURVEYS:  FOTO 51/70  COGNITION: Overall cognitive status: Within functional limits for tasks assessed     SENSATION: WFL  POSTURE: Rounded forward shoulders  UPPER EXTREMITY ROM:   Active ROM Right eval Left eval  Shoulder flexion 165 125 *  Shoulder extension    Shoulder abduction 177 120*  Shoulder adduction    Shoulder internal rotation HBB thumb to T7 HBB thumb to T9  Shoulder external rotation HBH normal HBH with L cervical lateral flexion substitution  Elbow flexion normal normal  Elbow extension normal normal  Wrist flexion    Wrist extension    Wrist ulnar deviation    Wrist radial deviation    Wrist pronation    Wrist supination    (Blank rows = not tested)  UPPER EXTREMITY MMT:  MMT Right eval Left eval  Shoulder flexion 5 4  Shoulder extension    Shoulder abduction 5 4  Shoulder adduction    Shoulder internal rotation 5 4  Shoulder external rotation 5 4  Middle trapezius    Lower trapezius    Elbow flexion    Elbow extension    Wrist flexion    Wrist extension    Wrist ulnar deviation     Wrist radial deviation    Wrist pronation    Wrist supination  Grip strength (lbs)    (Blank rows = not tested)   JOINT MOBILITY TESTING:  PROM L shoulder limited same as AROM, end feel is soft and not firm feeling- limited more by pain and soft tissue tightness rather than capsular end feel; no pain with GH inf glide, slightly hypomobile  PALPATION:  Soft tissue hypomobility/tightness along L>R  anterior/lateral ribcage, latissimus dorsi, subscap, pec major/minor; b/l scar healing well and (-) hypersensitive to touch (observation and palpation of incision sites on anterior breast performed with pt verbal consent)   TODAY'S TREATMENT:                                                                                                                                         DATE: 10/21/22 Subjective: Pt had her most recent fill procedure for implant process Thursday last week.  She was pretty sore for a few days after and the skin area feels tight.  She reports she has been working on her HEP/stretching.    Pain: currently 1/10  Objective: Manual Therapy: (15 minutes) Manual pec stretch in supine hooklying: 30 second intervals PROM flexion, abd, ER, IR    Therapeutic Exercises:( 25 min) Diaphragmatic breathing x 3 min Diaphragmatic breathing with hands behind head x 10 Crescent pose supine with hands behind head + diaphragmatic breathing x 5 R and L L shoulder wall slide to flexion x10 L scaption and then overhead cross body x10 ea Scapular retraction x 15 UT stretch 30 seconds ea Standing rows with scapular retraction: 2x12 red T-band  Shoulder flexion AAROM seated table slides:5x 20 sec- reviewed for HEP Shoulder abd  AAROM seated table slides: 5x20 sec- reviewed for HEP Seated shoulder ER AAROM at table 5x 20 seconds- reviewed for HEP Pec stretch in doorway: 5 x 20 sec- reviewed for HEP  Reviewed HEP: Access Code: [email or web address] URL: [email or web address] Date:  10/25/2022 Prepared by: Max Fickle  Exercises - Seated Shoulder Flexion Towel Slide at Table Top  - 1-2 x daily - 7 x weekly - 5 reps - 20 hold - Seated Shoulder Abduction Towel Slide at Table Top  - 1-2 x daily - 7 x weekly - 5 reps - 20 hold - Seated Shoulder External Rotation PROM on Table  - 1-2 x daily - 7 x weekly - 5 reps - 20 hold - Diaphragmatic Breathing at 90/90 Supported  - 1 x daily - 7 x weekly - 2 minutes of breathing slowly hold - Standing Scapular Retraction  - 1 x daily - 7 x weekly - 2 sets - 10 reps - Doorway Pec Stretch at 60 Elevation  - 1 x daily - 7 x weekly - 5 reps - 10 hold - Standing Row with Anchored Resistance  - 1 x daily - 3 x weekly - 2-3 sets - 12 reps  PATIENT EDUCATION: Education details: discussed PT POC/goals, HEP Person educated: Patient Education  method: Explanation and Handouts Education comprehension: verbalized understanding  HOME EXERCISE PROGRAM: Access Code: [email or web address]  ASSESSMENT:  CLINICAL IMPRESSION: Continued with shoulder PROM, AROM, and stretching today.  Pt tolerated progression of therapeutic exercises well and had no c/o increased pain at end of tx.  Will benefit from more practice with scapular/postural mm retraining at next visit.Should continue to benefit from skilled PT to address impairments listed below including pain and decreased L>R shoulder ROM and strength after undergoing b/l breast reconstruction surgery 06/29/22 with tissue expander placement and fill process in preparation for breast Implant placement after her history of breast cancer.  OBJECTIVE IMPAIRMENTS: decreased mobility, decreased ROM, decreased strength, increased fascial restrictions, impaired perceived functional ability, impaired UE functional use, postural dysfunction, and pain.   ACTIVITY LIMITATIONS: carrying, lifting, sleeping, transfers, bathing, reach over head, and hygiene/grooming  PARTICIPATION LIMITATIONS: cleaning, community activity, and  occupation  PERSONAL FACTORS: Past/current experiences and Time since onset of injury/illness/exacerbation are also affecting patient's functional outcome.   REHAB POTENTIAL: Excellent  CLINICAL DECISION MAKING: Stable/uncomplicated  EVALUATION COMPLEXITY: Low   GOALS: Goals reviewed with patient? Yes  SHORT TERM GOALS: Target date: 11/09/22  Pt will be able to demonstrate safe and independent transfers from supine to sit and sitting in tub to stand without being limited by L shoulder/upper quadrant pain Baseline: requires assistance from spouse sometimes Goal status: INITIAL   LONG TERM GOALS: Target date: 12/02/22  Improve L shoulder AROM to >150 degrees flexion and abduction to facilitate being able to reach and lift items overhead from a shelf at work or at home Baseline: L flexion 125, Abd 120 deg Goal status: INITIAL  2.  Improve L shoulder strength by >1/2 MMT grade to facilitate improved ability to lift/carry 20 lbs at home at at work for her job fulfilling orders of medical supplies Baseline: lifting restrictions set to <15-20 lbs currently Goal status: INITIAL  3.  Improve FOTO to >70 indicating pt is able to perform her ADLs, activities at home, and activities at work without being limited by L shoulder pain, stiffness, and weakness Baseline: 51 at initial evaluation (predicted 70 at DC) Goal status: INITIAL   PLAN:  PT FREQUENCY: 2x/week  PT DURATION: 8 weeks  PLANNED INTERVENTIONS: Therapeutic exercises, Therapeutic activity, Neuromuscular re-education, Balance training, Gait training, Patient/Family education, Self Care, and Joint mobilization  PLAN FOR NEXT SESSION: L>R shoulder PROM/AROM, stretching, manual therapy, scapular/postural mm motor control retraining  Max Fickle, PT, DPT, OCS Ardine Bjork, PT 10/25/2022, 11:18 AM

## 2022-10-27 ENCOUNTER — Ambulatory Visit: Payer: 59

## 2022-11-01 ENCOUNTER — Telehealth: Payer: Self-pay | Admitting: Surgical

## 2022-11-01 ENCOUNTER — Ambulatory Visit: Payer: 59

## 2022-11-01 NOTE — Telephone Encounter (Signed)
Patient called to check on FMLA paperwork that she said she had talked to [facility] about revising the form but said that her job hasn't received it yet and its due tomorrow. Please advise

## 2022-11-01 NOTE — Telephone Encounter (Signed)
I refaxed

## 2022-11-01 NOTE — Telephone Encounter (Signed)
Placed form up front for you,  Thanks so much

## 2022-11-03 ENCOUNTER — Ambulatory Visit: Payer: 59 | Admitting: Plastic Surgery

## 2022-11-03 VITALS — BP 152/90 | HR 92

## 2022-11-03 DIAGNOSIS — Z9889 Other specified postprocedural states: Secondary | ICD-10-CM

## 2022-11-03 DIAGNOSIS — N651 Disproportion of reconstructed breast: Secondary | ICD-10-CM

## 2022-11-03 NOTE — Progress Notes (Signed)
Jill Walsh returns today to discuss her next stages for breast reconstruction.  She now has 385 cc in her left tissue expander and it is at max capacity.  Her right side is still larger due to the excess skin that was left after her mastectomy.  Will wait 6 to 8 weeks before exchanging her tissue expander for a permanent implant.  During this period of time I have asked her to keep the skin moisturized and to continue trying to increase her range of motion.  Follow-up with me in 1 month for surgical scheduling

## 2022-11-04 ENCOUNTER — Ambulatory Visit: Payer: 59

## 2022-11-08 ENCOUNTER — Ambulatory Visit: Payer: 59

## 2022-11-17 ENCOUNTER — Ambulatory Visit (INDEPENDENT_AMBULATORY_CARE_PROVIDER_SITE_OTHER): Payer: 59 | Admitting: Surgical

## 2022-11-17 ENCOUNTER — Encounter: Payer: Self-pay | Admitting: Surgical

## 2022-11-17 DIAGNOSIS — Z9889 Other specified postprocedural states: Secondary | ICD-10-CM

## 2022-11-17 NOTE — Progress Notes (Signed)
Patient is a 52 year old female here for follow-up on her bilateral breast reconstruction.  She currently has 380 cc in her left breast tissue expander and 300 cc in the right breast tissue expander.  She is asymmetric to the left side having more skin removed prior to reconstruction.  Patient presents today with her daughter.  She reports she is overall doing well.  She would like to have another fill today if possible.  Chaperone present on exam On exam bilateral breast incisions are intact and bilateral breasts mastectomy flaps are viable.  Expanders are in place.  No erythema or cellulitic changes noted.  No subcutaneous fluid collection noted palpation.  A/P:  We placed injectable saline in the Expander using a sterile technique: Right: 0 cc for a total of 300 / 375 cc Left: 40 cc for a total of 420 / 375 cc  Patient is scheduled to follow-up on 12/02/2022 to see Dr. Ladona Ridgel to discuss expander to implant exchange.  She tolerated expansion well today.  There is no signs of infection or concern on exam.  Recommend calling with any questions or concerns.

## 2022-12-02 ENCOUNTER — Encounter: Payer: Self-pay | Admitting: Plastic Surgery

## 2022-12-02 ENCOUNTER — Ambulatory Visit: Payer: 59 | Admitting: Plastic Surgery

## 2022-12-02 VITALS — BP 155/88 | HR 85

## 2022-12-02 DIAGNOSIS — N651 Disproportion of reconstructed breast: Secondary | ICD-10-CM

## 2022-12-02 DIAGNOSIS — C50011 Malignant neoplasm of nipple and areola, right female breast: Secondary | ICD-10-CM

## 2022-12-02 DIAGNOSIS — L905 Scar conditions and fibrosis of skin: Secondary | ICD-10-CM

## 2022-12-02 NOTE — Progress Notes (Signed)
Jill Walsh returns today in follow-up after her last expansion on October 9.  Patient underwent placement of bilateral tissue expanders as part of her breast reconstruction on May 21.  She has had an uncomplicated course of tissue expansion though the expansion on the left has been difficult due to the extreme amount of scarring from her previous mastectomy.  She currently has 375 cc tissue expanders bilaterally with 300 cc in the right side and 420 in the left side  Today she has no specific complaints and no pain.  She has related to me that she is having difficulty with her FMLA paperwork.  On examination all incisions are well-healed.  She still has a significant asymmetry with more skin on the right than the left.  The base width on the right is 13 cm and I believe that she will need a maximum height of 14 cm for her implant.  We discussed the procedure for removing the tissue expanders in place and the implant.  This will be an outpatient procedure.  I will try to open the lower portion of the pocket as much as possible to give her as much ptosis as I can.  She understands that this will be minimal though.  I also discussed with her that I will wait until we are happy with the left breast before I remove the excess skin on the right side.  We discussed the risks including bleeding and infection.  She will not drain need drains after this particular procedure.  I anticipate that she will need 1 to 2 weeks out away from work.  Will schedule her between the middle of November and middle of December.

## 2022-12-06 ENCOUNTER — Telehealth: Payer: Self-pay | Admitting: Plastic Surgery

## 2022-12-06 NOTE — Telephone Encounter (Signed)
Pt was seen last week 10/24 and says she was told that Dr Ladona Ridgel or CMA would call her job regarding her fmla paperwork. She says she was denied fmla and needs someone to talk with her job. She called this morning to see why no one had spoken with them yet. Please f/u with patient.

## 2022-12-08 NOTE — Telephone Encounter (Signed)
I tried calling this patient, but no answer.  I am not familiar with this patient but it appears as though Jill Walsh has already completed her FMLA forms. If she calls back can we get further clarity on what specifically she or her employer needs? Thank you.

## 2022-12-09 NOTE — Telephone Encounter (Signed)
Called [facility] to speak with caseworker Myles Lipps E however, she was not available. I asked the representative I spoke to leave a message for her requesting that she give me a call back around 3:30 pm. She conveyed that she sent a message to her. I asked the representative if she could help but she advised that the case worker, Myles Lipps would be better to speak with about the matter.

## 2022-12-09 NOTE — Telephone Encounter (Signed)
Alan Ripper, [place] came in this morning and made the requested changes to the paperwork. Alesha faxed it to [phone] and emailed it to [email or web address]. She received a fax success confirmation and gave me the confirmation. Will send patient a MyChart message to update her as I've called the past two days and have only been able to leave voice mails. Pt may be at work during these attempts.

## 2022-12-09 NOTE — Telephone Encounter (Signed)
Called [place] and spoke to [place]. Myles Lipps was not available to speak with. I informed Shanda Bumps of the situation and she was more than happy to assist with this matter.   She advised that nothing on the forms needed to be changed except for questions 7 and 8. The end date for follow up treatment appointments needs to be changed from 10/13/22 to 10/13/23. A line needs to be drawn through 10/13/22 and initial and date beside 10/13/23. There needed to be a 1 beside frequency to show 1 appointment every 2 weeks was needed.   I advised that I would talk with either Alan Ripper or [place], the PA(s) that have been working on this and have one of them make the necessary changes. She confirmed the fax # to send it to which is the same on on the forms. I adv that I would have it faxed 12/09/22.  She conveyed understanding.

## 2022-12-20 ENCOUNTER — Ambulatory Visit
Admission: EM | Admit: 2022-12-20 | Discharge: 2022-12-20 | Disposition: A | Discharge status: Home / Self Care | Payer: 59 | Attending: Emergency Medicine | Admitting: Emergency Medicine

## 2022-12-20 DIAGNOSIS — N644 Mastodynia: Secondary | ICD-10-CM | POA: Diagnosis not present

## 2022-12-20 MED ORDER — OXYCODONE HCL 5 MG PO TABS: 5.0000 mg | ORAL_TABLET | Freq: Four times a day (QID) | ORAL | 0 refills | Status: DC | PRN

## 2022-12-20 MED ORDER — METHOCARBAMOL 500 MG PO TABS: 500.0000 mg | ORAL_TABLET | Freq: Three times a day (TID) | ORAL | 0 refills | Status: DC | PRN

## 2022-12-20 NOTE — ED Triage Notes (Signed)
Patient states that she is getting breast implant due to breast cancer, left breast painful. She is in the process of skin stretching for the implants. Pain for 3 days.

## 2022-12-20 NOTE — ED Provider Notes (Signed)
[facility]    CSN: [id number] Arrival date & time: 12/20/22  1127      History   Chief Complaint Chief Complaint  Patient presents with   Breast Pain    HPI Jill Walsh is a 52 y.o. female.   HPI  52 year old female with a past medical history significant for asthma, breast cancer and status post mastectomy presents for evaluation of pain in her left breast.  She currently has bilateral tissue expanders in anticipation of reconstructive surgery in December.  She was last seen by her surgeon on 12/02/2022 who indicated that there was a lot of scar tissue on the left-hand side and he had to instill 120 more mL of fluid.  She reports that she was at work on Friday and she lifted something heavy and that is when the pain began.  She denies any fever or redness.  She describes the pain as a throbbing.  She has been treating it with methocarbamol at home but took her last 1 last night.  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   Family history of colon cancer 04/23/2019   Simple chronic bronchitis (HCC) 05/30/2017   Breast pain 12/20/2014   Family history of breast cancer 05/28/2014   Current smoker 05/28/2014   Breast cancer (HCC) 05/28/2014   Breast cancer, right breast (HCC) 03/28/2014    Past Surgical History:  Procedure Laterality Date   BREAST BIOPSY Left    02/28/14 negative   BREAST BIOPSY Right     02/2014 malignant   BREAST RECONSTRUCTION WITH PLACEMENT OF TISSUE EXPANDER AND FLEX HD (ACELLULAR HYDRATED DERMIS) Bilateral 06/29/2022   Procedure: BREAST RECONSTRUCTION WITH PLACEMENT OF TISSUE EXPANDER AND FLEX HD (ACELLULAR HYDRATED DERMIS);  Surgeon: Santiago Glad, MD;  Location: [facility];  Service: Plastics;  Laterality: Bilateral;   COLONOSCOPY WITH  PROPOFOL N/A 11/13/2018   Procedure: COLONOSCOPY WITH PROPOFOL;  Surgeon: Wyline Mood, MD;  Location: [facility] ENDOSCOPY;  Service: Gastroenterology;  Laterality: N/A;   MASTECTOMY Right 2016   BREAST CA   TOTAL MASTECTOMY Left 05/29/2020   Procedure: TOTAL MASTECTOMY;  Surgeon: Sung Amabile, DO;  Location: [facility];  Service: General;  Laterality: Left;   TUBAL LIGATION      OB History   No obstetric history on file.      Home Medications    Prior to Admission medications   Medication Sig Start Date End Date Taking? Authorizing Provider  oxyCODONE (ROXICODONE) 5 MG immediate release tablet Take 1 tablet (5 mg total) by mouth every 6 (six) hours as needed for severe pain (pain score 7-10). 12/20/22  Yes Becky Augusta, NP  Acetaminophen (TYLENOL 8 HOUR PO) Take by mouth.    [provider]  cyclobenzaprine (FLEXERIL) 5 MG tablet Take 1 tablet (5 mg total) by mouth 3 (three) times daily as needed for muscle spasms. 07/22/22   Scheeler, Kermit Balo, PA-C  gabapentin (NEURONTIN) 300 MG capsule Take 2 capsules (600 mg total) by mouth at bedtime. 10/28/21   Alinda Dooms, NP  hydrOXYzine (ATARAX/VISTARIL) 25 MG tablet Take 1 tablet (25 mg total) by mouth every 6 (six) hours as needed for itching. 05/24/19   Domenick Gong, MD  ibuprofen (ADVIL) 200 MG tablet Take 200 mg by mouth every  6 (six) hours as needed.    [provider]  methocarbamol (ROBAXIN) 500 MG tablet Take 1 tablet (500 mg total) by mouth every 8 (eight) hours as needed for up to 12 doses for muscle spasms. 12/20/22   Becky Augusta, NP  ondansetron (ZOFRAN) 4 MG tablet Take 1 tablet (4 mg total) by mouth every 8 (eight) hours as needed for nausea or vomiting. 06/14/22   Hedges, Tinnie Gens, PA-C  ipratropium-albuterol (DUONEB) 0.5-2.5 (3) MG/3ML SOLN Take 3 mLs by nebulization every 6 (six) hours as needed. 03/23/18 12/27/18  Duanne Limerick, MD    Family History Family History  Problem Relation Age of Onset   Cancer  Mother    Breast cancer Mother 42   Stroke Brother    Cancer Maternal Aunt     Social History Social History   Tobacco Use   Smoking status: Former    Current packs/day: 0.00    Types: Cigarettes, E-cigarettes    Start date: 05/23/1999    Quit date: 05/23/2014    Years since quitting: 8.5   Smokeless tobacco: Former  Building services engineer status: Former  Substance Use Topics   Alcohol use: No   Drug use: No     Allergies   Patient has no known allergies.   Review of Systems Review of Systems  Constitutional:  Negative for fever.  Cardiovascular:        Left breast pain  Skin:  Negative for color change.     Physical Exam Triage Vital Signs ED Triage Vitals  Encounter Vitals Group     BP 12/20/22 1136 (!) 174/90     Systolic BP Percentile --      Diastolic BP Percentile --      Pulse Rate 12/20/22 1136 100     Resp 12/20/22 1136 20     Temp 12/20/22 1136 98 F (36.7 C)     Temp Source 12/20/22 1136 Oral     SpO2 12/20/22 1136 99 %     Weight --      Height --      Head Circumference --      Peak Flow --      Pain Score 12/20/22 1134 9     Pain Loc --      Pain Education --      Exclude from Growth Chart --    No data found.  Updated Vital Signs BP (!) 174/90 (BP Location: Left Arm)   Pulse 100   Temp 98 F (36.7 C) (Oral)   Resp 20   LMP 04/22/2019   SpO2 99%   Visual Acuity Right Eye Distance:   Left Eye Distance:   Bilateral Distance:    Right Eye Near:   Left Eye Near:    Bilateral Near:     Physical Exam Vitals and nursing note reviewed. Exam conducted with a chaperone present Johna Roles, CMA).  Constitutional:      General: She is in acute distress.     Appearance: Normal appearance.     Comments: Patient is tearful and does appear to be in a moderate degree of pain.  Skin:    General: Skin is warm and dry.     Capillary Refill: Capillary refill takes less than 2 seconds.     Findings: No bruising or erythema.  Neurological:      General: No focal deficit present.     Mental Status: She is alert and oriented to person, place, and  time.      UC Treatments / Results  Labs (all labs ordered are listed, but only abnormal results are displayed) Labs Reviewed - No data to display  EKG   Radiology No results found.  Procedures Procedures (including critical care time)  Medications Ordered in UC Medications - No data to display  Initial Impression / Assessment and Plan / UC Course  I have reviewed the triage vital signs and the nursing notes.  Pertinent labs & imaging results that were available during my care of the patient were reviewed by me and considered in my medical decision making (see chart for details).   Patient is a pleasant 52 year old female presenting for evaluation of left breast pain in the setting of recent volume increase in tissue expanders for breast reconstructive surgery.  The left breast is taut but it is not warm to touch and there is no overlying erythema or ecchymosis.  It is unclear if the patient strained some scar tissue from her previous mastectomy, or if the tissue expander has moved.  She has not contacted her breast surgeon as their office is closed today being that it is Veterans Day.  I will discharge her home with a short prescription of oxycodone 5 mg immediate release that she can take every 6 hours as needed for pain along with Robaxin 500 mg every 8 hours as needed.  She should contact her breast surgeon tomorrow and follow-up with them.  I have given her a note to give her several days off of work and allow her to rest.   Final Clinical Impressions(s) / UC Diagnoses   Final diagnoses:  Breast pain, left     Discharge Instructions      As we discussed, the pain may be related to scar tissue inflammation, or your tissue expander could have moved when you lifted the heavy object at work 3 days ago.  You need to contact your breast surgeon pursing the morning and  let them know that you are having pain that you were evaluated in the urgent care.  Use the oxycodone 5 mg every 6 hours as needed for severe pain.  You may also use over-the-counter Tylenol and/or ibuprofen for mild to moderate pain.  Take them according to the package instructions.  I have refilled your Robaxin and you can take one 500 mg tablet every 8 hours as needed for muscle spasm in your chest wall.  You may also apply ice or moist heat to your chest wall for 20 minutes at a time 2-3 times a day as needed.  You may use whichever modality feels better to you.     ED Prescriptions     Medication Sig Dispense Auth. Provider   methocarbamol (ROBAXIN) 500 MG tablet Take 1 tablet (500 mg total) by mouth every 8 (eight) hours as needed for up to 12 doses for muscle spasms. 12 tablet Becky Augusta, NP   oxyCODONE (ROXICODONE) 5 MG immediate release tablet Take 1 tablet (5 mg total) by mouth every 6 (six) hours as needed for severe pain (pain score 7-10). 12 tablet Becky Augusta, NP      I have reviewed the PDMP during this encounter.   Becky Augusta, NP 12/20/22 [phone]

## 2022-12-20 NOTE — Discharge Instructions (Signed)
As we discussed, the pain may be related to scar tissue inflammation, or your tissue expander could have moved when you lifted the heavy object at work 3 days ago.  You need to contact your breast surgeon pursing the morning and let them know that you are having pain that you were evaluated in the urgent care.  Use the oxycodone 5 mg every 6 hours as needed for severe pain.  You may also use over-the-counter Tylenol and/or ibuprofen for mild to moderate pain.  Take them according to the package instructions.  I have refilled your Robaxin and you can take one 500 mg tablet every 8 hours as needed for muscle spasm in your chest wall.  You may also apply ice or moist heat to your chest wall for 20 minutes at a time 2-3 times a day as needed.  You may use whichever modality feels better to you.

## 2022-12-24 ENCOUNTER — Telehealth: Payer: Self-pay | Admitting: Plastic Surgery

## 2022-12-24 NOTE — Telephone Encounter (Signed)
Just called and had to leave the pt a message, Dr Ladona Ridgel would like the pt to come in and see him or Sheron Nightingale does have openings on Wed the 20th on a Dr Ladona Ridgel day.

## 2022-12-29 ENCOUNTER — Ambulatory Visit: Payer: 59 | Admitting: Surgical

## 2022-12-29 DIAGNOSIS — C50011 Malignant neoplasm of nipple and areola, right female breast: Secondary | ICD-10-CM

## 2022-12-29 DIAGNOSIS — C50012 Malignant neoplasm of nipple and areola, left female breast: Secondary | ICD-10-CM

## 2022-12-29 NOTE — Progress Notes (Signed)
Patient is a 52 year old female here for follow-up to discuss left breast pain.  She was seen in the urgent care 9 days ago for left breast pain after lifting a heavy object at work.  She reports that she was evaluated in the urgent care, prescribed Robaxin.  She reports she used some oxycodone which was prescribed after her surgery which significantly helped.  She also did some ice.  She reports the pain has resolved this point.  She is scheduled for expander removal and placement of bilateral breast implants with Dr. Ladona Ridgel in about 1 month.  She is here with her brother today.  Chaperone present on exam On exam bilateral breast incisions are intact, bilateral expanders are in place, expanders are firm.  There is no erythema or cellulitic changes noted.  No significant tenderness noted on exam.  There is no subcutaneous fluid collections or hematomas noted with palpation.  A/P:  No signs of concern on exam.  Provided patient with consent forms to review at home prior to her preop appointment.  Recommend following up at her preop appointment in approximately 2 weeks.  Discussed with patient to please call our office if she has any additional concerns or questions.  Notified patient that we do have on-call service 24 hours, 7 days a week and that we are available for any urgent concerns.

## 2023-01-10 ENCOUNTER — Ambulatory Visit (INDEPENDENT_AMBULATORY_CARE_PROVIDER_SITE_OTHER): Payer: 59 | Admitting: Surgical

## 2023-01-10 ENCOUNTER — Encounter: Payer: Self-pay | Admitting: Surgical

## 2023-01-10 DIAGNOSIS — Z9889 Other specified postprocedural states: Secondary | ICD-10-CM

## 2023-01-10 DIAGNOSIS — C50012 Malignant neoplasm of nipple and areola, left female breast: Secondary | ICD-10-CM

## 2023-01-10 DIAGNOSIS — C50011 Malignant neoplasm of nipple and areola, right female breast: Secondary | ICD-10-CM

## 2023-01-10 NOTE — Progress Notes (Signed)
Patient ID: Jill Walsh, female    DOB: 1970-07-09, 52 y.o.   MRN: [id number]     ICD-10-CM   1. 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 S/P breast reconstruction, bilateral  Z98.890     3. Malignant neoplasm involving both nipple and areola of right breast in female, unspecified estrogen receptor status (HCC)  C50.011       History of Present Illness: Jill Walsh is a 52 y.o.  female  with a history of bilateral mastectomies with subsequent breast reconstruction.  She presents for preoperative evaluation for upcoming procedure, removal of bilateral breast tissue expanders and placement of bilateral breast implants, scheduled for 02/07/2023 with Dr. Ladona Ridgel.  She reported to office staff today that she tested positive for COVID last week on Wednesday and is still having some symptoms.  We will reschedule her appointment for next week for preoperative evaluation.  I did review patient's medical history today, based on her past medical history, do not feel as if surgical clearance is necessary.  Will verbally review medical clearance with patient next week to ensure no changes.   Past Medical History: Allergies: No Known Allergies  Current Medications:  Current Outpatient Medications:    Acetaminophen (TYLENOL 8 HOUR PO), Take by mouth., Disp: , Rfl:    cyclobenzaprine (FLEXERIL) 5 MG tablet, Take 1 tablet (5 mg total) by mouth 3 (three) times daily as needed for muscle spasms., Disp: 30 tablet, Rfl: 1   gabapentin (NEURONTIN) 300 MG capsule, Take 2 capsules (600 mg total) by mouth at bedtime., Disp: 120 capsule, Rfl: 0   hydrOXYzine (ATARAX/VISTARIL) 25 MG tablet, Take 1 tablet (25 mg total) by mouth every 6 (six) hours as needed for itching., Disp: 20 tablet, Rfl: 0   ibuprofen (ADVIL) 200 MG tablet, Take 200 mg by mouth every 6 (six) hours as needed., Disp: , Rfl:     methocarbamol (ROBAXIN) 500 MG tablet, Take 1 tablet (500 mg total) by mouth every 8 (eight) hours as needed for up to 12 doses for muscle spasms., Disp: 12 tablet, Rfl: 0   ondansetron (ZOFRAN) 4 MG tablet, Take 1 tablet (4 mg total) by mouth every 8 (eight) hours as needed for nausea or vomiting., Disp: 20 tablet, Rfl: 0   oxyCODONE (ROXICODONE) 5 MG immediate release tablet, Take 1 tablet (5 mg total) by mouth every 6 (six) hours as needed for severe pain (pain score 7-10)., Disp: 12 tablet, Rfl: 0  Past Medical Problems: Past Medical History:  Diagnosis Date   Asthma    Breast cancer (HCC) 2016   RT MASTECTOMY    Past Surgical History: Past Surgical History:  Procedure Laterality Date   BREAST BIOPSY Left    02/28/14 negative   BREAST BIOPSY Right     02/2014 malignant   BREAST RECONSTRUCTION WITH PLACEMENT OF TISSUE EXPANDER AND FLEX HD (ACELLULAR HYDRATED DERMIS) Bilateral 06/29/2022   Procedure: BREAST RECONSTRUCTION WITH PLACEMENT OF TISSUE EXPANDER AND FLEX HD (ACELLULAR HYDRATED DERMIS);  Surgeon: Santiago Glad, MD;  Location: [facility];  Service: Plastics;  Laterality: Bilateral;   COLONOSCOPY WITH PROPOFOL N/A 11/13/2018   Procedure: COLONOSCOPY WITH PROPOFOL;  Surgeon: Wyline Mood, MD;  Location: [facility] END[facility]COPY;  Service: Gastroenterology;  Laterality: N/A;   MASTECTOMY Right 2016   BREAST CA   TOTAL MASTECTOMY Left 05/29/2020   Procedure: TOTAL MASTECTOMY;  Surgeon: Sung Amabile, DO;  Location: [facility];  Service: General;  Laterality: Left;   TUBAL LIGATION      Social History: Social History   Socioeconomic History   Marital status: Single    Spouse name: Not on file   Number of children: Not on file   Years of education: Not on file   Highest education level: Not on file  Occupational History   Not on file  Tobacco Use   Smoking status: Former    Current packs/day: 0.00    Types: Cigarettes, E-cigarettes    Start date: 05/23/1999    Quit  date: 05/23/2014    Years since quitting: 8.6   Smokeless tobacco: Former  Building services engineer status: Former  Substance and Sexual Activity   Alcohol use: No   Drug use: No   Sexual activity: Not on file  Other Topics Concern   Not on file  Social History Narrative   Not on file   Social Determinants of Health   Financial Resource Strain: Medium Risk (10/30/2021)   Overall Financial Resource Strain (CARDIA)    Difficulty of Paying Living Expenses: Somewhat hard  Food Insecurity: No Food Insecurity (10/30/2021)   Hunger Vital Sign    Worried About Running Out of Food in the Last Year: Never true    Ran Out of Food in the Last Year: Never true  Transportation Needs: No Transportation Needs (10/30/2021)   PRAPARE - Administrator, Civil Service (Medical): No    Lack of Transportation (Non-Medical): No  Physical Activity: Inactive (10/30/2021)   Exercise Vital Sign    Days of Exercise per Week: 0 days    Minutes of Exercise per Session: 0 min  Stress: Stress Concern Present (10/30/2021)   [employer] of Occupational Health - Occupational Stress Questionnaire    Feeling of Stress : Rather much  Social Connections: Socially Integrated (10/30/2021)   Social Connection and Isolation Panel [NHANES]    Frequency of Communication with Friends and Family: Three times a week    Frequency of Social Gatherings with Friends and Family: Twice a week    Attends Religious Services: 1 to 4 times per year    Active Member of [employer] or Organizations: Yes    Attends Banker Meetings: 1 to 4 times per year    Marital Status: Married  Catering manager Violence: Patient Declined (10/30/2021)   Humiliation, Afraid, Rape, and Kick questionnaire    Fear of Current or Ex-Partner: Patient declined    Emotionally Abused: Patient declined    Physically Abused: Patient declined    Sexually Abused: Patient declined    Family History: Family History  Problem Relation Age of  Onset   Cancer Mother    Breast cancer Mother 46   Stroke Brother    Cancer Maternal Aunt     Assessment/Plan:  Will follow-up with patient next week for preop appointment. Reviewed patient's past medical history, no clearance is necessary at this time.   Electronically signed by: Kermit Balo Shaqueta Casady, PA-C 01/10/2023 1:02 PM

## 2023-01-17 ENCOUNTER — Ambulatory Visit: Payer: 59 | Admitting: Surgical

## 2023-01-17 ENCOUNTER — Encounter: Payer: Self-pay | Admitting: Surgical

## 2023-01-17 VITALS — BP 169/89 | HR 116

## 2023-01-17 DIAGNOSIS — C50012 Malignant neoplasm of nipple and areola, left female breast: Secondary | ICD-10-CM

## 2023-01-17 DIAGNOSIS — C50011 Malignant neoplasm of nipple and areola, right female breast: Secondary | ICD-10-CM

## 2023-01-17 MED ORDER — ONDANSETRON HCL 4 MG PO TABS: 4.0000 mg | ORAL_TABLET | Freq: Three times a day (TID) | ORAL | 0 refills | Status: DC | PRN

## 2023-01-17 MED ORDER — OXYCODONE HCL 5 MG PO TABS: 5.0000 mg | ORAL_TABLET | Freq: Four times a day (QID) | ORAL | 0 refills | Status: AC | PRN

## 2023-01-17 MED ORDER — SULFAMETHOXAZOLE-TRIMETHOPRIM 800-160 MG PO TABS: 1.0000 | ORAL_TABLET | Freq: Two times a day (BID) | ORAL | 0 refills | Status: AC

## 2023-01-17 NOTE — H&P (View-Only) (Signed)
 Patient ID: Jill Walsh, female    DOB: 09/07/1970, 52 y.o.   MRN: [id number]  Chief Complaint  Patient presents with   Pre-op Exam      ICD-10-CM   1. 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  female  with a history of bilateral mastectomy with tissue expanders in place.  She presents for preoperative evaluation for upcoming procedure, removal of bilateral breast tissue expanders and placement of bilateral breast implants, scheduled for 02/07/2023 with Dr. Ladona Ridgel.  The patient has not had problems with anesthesia. No history of DVT/PE.  No family history of DVT/PE.  No family or personal history of bleeding or clotting disorders.  Patient is not currently taking any blood thinners.  No history of CVA/MI.   Patient does have a history of asthma, reports she has overall been doing well with this.  She did have a COVID infection around Thanksgiving and reports that she needed to use her inhaler occasionally.  She has not needed to use her inhaler recently.  Patient is status post bilateral mastectomy.  She had a mastectomy of the right side in 2016 and a mastectomy left side in 2022.  She did have extra skin removed from the left side which has resulted in some asymmetry and need for asymmetric expansion.  She currently has 300/375 in the right breast tissue expander and 420/375 in the left breast tissue expander.  Job: Patient will remain out of work for 6 weeks due to restrictions  PMH Significant for: Breast cancer, status post bilateral mastectomy, asthma.  Patient reports she is feeling well today, she is here with her husband.  She is very excited for surgery and to have the expanders exchanged to implants.  She is not having any infectious symptoms today.  She does not have any specific concerns.  She feels understanding of  the risks and has read through the consent forms.   Past Medical History: Allergies: No Known Allergies  Current Medications:  Current Outpatient Medications:    Acetaminophen (TYLENOL 8 HOUR PO), Take by mouth., Disp: , Rfl:    cyclobenzaprine (FLEXERIL) 5 MG tablet, Take 1 tablet (5 mg total) by mouth 3 (three) times daily as needed for muscle spasms., Disp: 30 tablet, Rfl: 1   gabapentin (NEURONTIN) 300 MG capsule, Take 2 capsules (600 mg total) by mouth at bedtime., Disp: 120 capsule, Rfl: 0   hydrOXYzine (ATARAX/VISTARIL) 25 MG tablet, Take 1 tablet (25 mg total) by mouth every 6 (six) hours as needed for itching., Disp: 20 tablet, Rfl: 0   ibuprofen (ADVIL) 200 MG tablet, Take 200 mg by mouth every 6 (six) hours as needed., Disp: , Rfl:    methocarbamol (ROBAXIN) 500 MG tablet, Take 1 tablet (500 mg total) by mouth every 8 (eight) hours as needed for up to 12 doses for muscle spasms., Disp: 12 tablet, Rfl: 0   ondansetron (ZOFRAN) 4 MG tablet, Take 1 tablet (4 mg total) by mouth every 8 (eight) hours as needed for nausea or vomiting., Disp: 20 tablet, Rfl: 0   ondansetron (ZOFRAN) 4 MG tablet, Take 1 tablet (4 mg total) by mouth every 8 (eight) hours as needed for nausea or vomiting., Disp: 20 tablet, Rfl: 0   oxyCODONE (OXY IR/ROXICODONE) 5 MG immediate release tablet, Take 1 tablet (5  mg total) by mouth every 6 (six) hours as needed for up to 5 days for severe pain (pain score 7-10)., Disp: 20 tablet, Rfl: 0   oxyCODONE (ROXICODONE) 5 MG immediate release tablet, Take 1 tablet (5 mg total) by mouth every 6 (six) hours as needed for severe pain (pain score 7-10)., Disp: 12 tablet, Rfl: 0   sulfamethoxazole-trimethoprim (BACTRIM DS) 800-160 MG tablet, Take 1 tablet by mouth 2 (two) times daily for 5 days., Disp: 10 tablet, Rfl: 0  Past Medical Problems: Past Medical History:  Diagnosis Date   Asthma    Breast cancer (HCC) 2016   RT MASTECTOMY    Past Surgical History: Past  Surgical History:  Procedure Laterality Date   BREAST BIOPSY Left    02/28/14 negative   BREAST BIOPSY Right     02/2014 malignant   BREAST RECONSTRUCTION WITH PLACEMENT OF TISSUE EXPANDER AND FLEX HD (ACELLULAR HYDRATED DERMIS) Bilateral 06/29/2022   Procedure: BREAST RECONSTRUCTION WITH PLACEMENT OF TISSUE EXPANDER AND FLEX HD (ACELLULAR HYDRATED DERMIS);  Surgeon: Santiago Glad, MD;  Location: [facility];  Service: Plastics;  Laterality: Bilateral;   COLONOSCOPY WITH PROPOFOL N/A 11/13/2018   Procedure: COLONOSCOPY WITH PROPOFOL;  Surgeon: Wyline Mood, MD;  Location: [facility];  Service: Gastroenterology;  Laterality: N/A;   MASTECTOMY Right 2016   BREAST CA   TOTAL MASTECTOMY Left 05/29/2020   Procedure: TOTAL MASTECTOMY;  Surgeon: Sung Amabile, DO;  Location: [facility];  Service: General;  Laterality: Left;   TUBAL LIGATION      Social History: Social History   Socioeconomic History   Marital status: Single    Spouse name: Not on file   Number of children: Not on file   Years of education: Not on file   Highest education level: Not on file  Occupational History   Not on file  Tobacco Use   Smoking status: Former    Current packs/day: 0.00    Types: Cigarettes, E-cigarettes    Start date: 05/23/1999    Quit date: 05/23/2014    Years since quitting: 8.6   Smokeless tobacco: Former  Building services engineer status: Former  Substance and Sexual Activity   Alcohol use: No   Drug use: No   Sexual activity: Not on file  Other Topics Concern   Not on file  Social History Narrative   Not on file   Social Determinants of Health   Financial Resource Strain: Medium Risk (10/30/2021)   Overall Financial Resource Strain (CARDIA)    Difficulty of Paying Living Expenses: Somewhat hard  Food Insecurity: No Food Insecurity (10/30/2021)   Hunger Vital Sign    Worried About Running Out of Food in the Last Year: Never true    Ran Out of Food in the Last Year: Never  true  Transportation Needs: No Transportation Needs (10/30/2021)   PRAPARE - Administrator, Civil Service (Medical): No    Lack of Transportation (Non-Medical): No  Physical Activity: Inactive (10/30/2021)   Exercise Vital Sign    Days of Exercise per Week: 0 days    Minutes of Exercise per Session: 0 min  Stress: Stress Concern Present (10/30/2021)   [employer] of Occupational Health - Occupational Stress Questionnaire    Feeling of Stress : Rather much  Social Connections: Socially Integrated (10/30/2021)   Social Connection and Isolation Panel [NHANES]    Frequency of Communication with Friends and Family: Three times a week    Frequency  of Social Gatherings with Friends and Family: Twice a week    Attends Religious Services: 1 to 4 times per year    Active Member of [employer] or Organizations: Yes    Attends Banker Meetings: 1 to 4 times per year    Marital Status: Married  Catering manager Violence: Patient Declined (10/30/2021)   Humiliation, Afraid, Rape, and Kick questionnaire    Fear of Current or Ex-Partner: Patient declined    Emotionally Abused: Patient declined    Physically Abused: Patient declined    Sexually Abused: Patient declined    Family History: Family History  Problem Relation Age of Onset   Cancer Mother    Breast cancer Mother 29   Stroke Brother    Cancer Maternal Aunt     Review of Systems: ROS  Physical Exam: Vital Signs BP (!) 169/89 (BP Location: Left Arm, Patient Position: Sitting, Cuff Size: Large)   Pulse (!) 116   LMP 04/22/2019   SpO2 97%   Physical Exam Constitutional:      General: Not in acute distress.    Appearance: Normal appearance. Not ill-appearing.  HENT:     Head: Normocephalic and atraumatic.  Eyes:     Pupils: Pupils are equal, round Neck:     Musculoskeletal: Normal range of motion.  Cardiovascular:     Rate and Rhythm: Normal rate    Pulses: Normal pulses.  Pulmonary:     Effort:  Pulmonary effort is normal. No respiratory distress.  Abdominal:     General: Abdomen is flat. There is no distension.  Musculoskeletal: Normal range of motion.  Skin:    General: Skin is warm and dry.     Findings: No erythema or Walsh.  Neurological:     General: No focal deficit present.     Mental Status: Alert and oriented to person, place, and time. Mental status is at baseline.     Motor: No weakness.  Psychiatric:        Mood and Affect: Mood normal.        Behavior: Behavior normal.    Assessment/Plan: The patient is scheduled for expander to implant exchange with Dr. Ladona Ridgel.  Risks, benefits, and alternatives of procedure discussed, questions answered and consent obtained.    Smoking Status: Non-smoker; Counseling Given?  N/A  Caprini Score: 6; Risk Factors include: Age, history of cancer, BMI > 25, and length of planned surgery. Recommendation for mechanical prophylaxis. Encourage early ambulation.   Pictures obtained: 12/02/2022  Post-op Rx sent to pharmacy: Oxycodone, Zofran, Keflex  Patient was provided with the General Surgical Risk consent document and Pain Medication Agreement prior to their appointment.  They had adequate time to read through the risk consent documents and Pain Medication Agreement. We also discussed them in person together during this preop appointment. All of their questions were answered to their satisfaction.  Recommended calling if they have any further questions.  Risk consent form and Pain Medication Agreement to be scanned into patient's chart.  Patient was provided with the Mentor implant patient decision checklist and this was completed during today's preoperative evaluation. Patient had time to read through the information and any questions were answered to their content. Form will be scanned into patient's chart.  The risks that can be encountered with and after placement of a breast implant were discussed and include the following but not  limited to these: bleeding, infection, delayed healing, anesthesia risks, skin sensation changes, injury to structures including nerves, blood vessels,  and muscles which may be temporary or permanent, allergies to tape, suture materials and glues, blood products, topical preparations or injected agents, skin contour irregularities, skin discoloration and swelling, deep vein thrombosis, cardiac and pulmonary complications, pain, which may persist, fluid accumulation, wrinkling of the skin over the implanmt, changes in nipple or breast sensation, implant leakage or rupture, faulty position of the implant, persistent pain, formation of tight scar tissue around the implant (capsular contracture).  Recommend holding ibuprofen 5 days prior to surgery. Confirmed implants have been ordered  Electronically signed by: Kermit Balo Lynton Crescenzo, PA-C 01/17/2023 2:07 PM

## 2023-01-17 NOTE — Progress Notes (Signed)
Patient ID: Jill Walsh, female    DOB: 09/07/1970, 52 y.o.   MRN: [id number]  Chief Complaint  Patient presents with   Pre-op Exam      ICD-10-CM   1. 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  female  with a history of bilateral mastectomy with tissue expanders in place.  She presents for preoperative evaluation for upcoming procedure, removal of bilateral breast tissue expanders and placement of bilateral breast implants, scheduled for 02/07/2023 with Dr. Ladona Ridgel.  The patient has not had problems with anesthesia. No history of DVT/PE.  No family history of DVT/PE.  No family or personal history of bleeding or clotting disorders.  Patient is not currently taking any blood thinners.  No history of CVA/MI.   Patient does have a history of asthma, reports she has overall been doing well with this.  She did have a COVID infection around Thanksgiving and reports that she needed to use her inhaler occasionally.  She has not needed to use her inhaler recently.  Patient is status post bilateral mastectomy.  She had a mastectomy of the right side in 2016 and a mastectomy left side in 2022.  She did have extra skin removed from the left side which has resulted in some asymmetry and need for asymmetric expansion.  She currently has 300/375 in the right breast tissue expander and 420/375 in the left breast tissue expander.  Job: Patient will remain out of work for 6 weeks due to restrictions  PMH Significant for: Breast cancer, status post bilateral mastectomy, asthma.  Patient reports she is feeling well today, she is here with her husband.  She is very excited for surgery and to have the expanders exchanged to implants.  She is not having any infectious symptoms today.  She does not have any specific concerns.  She feels understanding of  the risks and has read through the consent forms.   Past Medical History: Allergies: No Known Allergies  Current Medications:  Current Outpatient Medications:    Acetaminophen (TYLENOL 8 HOUR PO), Take by mouth., Disp: , Rfl:    cyclobenzaprine (FLEXERIL) 5 MG tablet, Take 1 tablet (5 mg total) by mouth 3 (three) times daily as needed for muscle spasms., Disp: 30 tablet, Rfl: 1   gabapentin (NEURONTIN) 300 MG capsule, Take 2 capsules (600 mg total) by mouth at bedtime., Disp: 120 capsule, Rfl: 0   hydrOXYzine (ATARAX/VISTARIL) 25 MG tablet, Take 1 tablet (25 mg total) by mouth every 6 (six) hours as needed for itching., Disp: 20 tablet, Rfl: 0   ibuprofen (ADVIL) 200 MG tablet, Take 200 mg by mouth every 6 (six) hours as needed., Disp: , Rfl:    methocarbamol (ROBAXIN) 500 MG tablet, Take 1 tablet (500 mg total) by mouth every 8 (eight) hours as needed for up to 12 doses for muscle spasms., Disp: 12 tablet, Rfl: 0   ondansetron (ZOFRAN) 4 MG tablet, Take 1 tablet (4 mg total) by mouth every 8 (eight) hours as needed for nausea or vomiting., Disp: 20 tablet, Rfl: 0   ondansetron (ZOFRAN) 4 MG tablet, Take 1 tablet (4 mg total) by mouth every 8 (eight) hours as needed for nausea or vomiting., Disp: 20 tablet, Rfl: 0   oxyCODONE (OXY IR/ROXICODONE) 5 MG immediate release tablet, Take 1 tablet (5  mg total) by mouth every 6 (six) hours as needed for up to 5 days for severe pain (pain score 7-10)., Disp: 20 tablet, Rfl: 0   oxyCODONE (ROXICODONE) 5 MG immediate release tablet, Take 1 tablet (5 mg total) by mouth every 6 (six) hours as needed for severe pain (pain score 7-10)., Disp: 12 tablet, Rfl: 0   sulfamethoxazole-trimethoprim (BACTRIM DS) 800-160 MG tablet, Take 1 tablet by mouth 2 (two) times daily for 5 days., Disp: 10 tablet, Rfl: 0  Past Medical Problems: Past Medical History:  Diagnosis Date   Asthma    Breast cancer (HCC) 2016   RT MASTECTOMY    Past Surgical History: Past  Surgical History:  Procedure Laterality Date   BREAST BIOPSY Left    02/28/14 negative   BREAST BIOPSY Right     02/2014 malignant   BREAST RECONSTRUCTION WITH PLACEMENT OF TISSUE EXPANDER AND FLEX HD (ACELLULAR HYDRATED DERMIS) Bilateral 06/29/2022   Procedure: BREAST RECONSTRUCTION WITH PLACEMENT OF TISSUE EXPANDER AND FLEX HD (ACELLULAR HYDRATED DERMIS);  Surgeon: Santiago Glad, MD;  Location: [facility];  Service: Plastics;  Laterality: Bilateral;   COLONOSCOPY WITH PROPOFOL N/A 11/13/2018   Procedure: COLONOSCOPY WITH PROPOFOL;  Surgeon: Wyline Mood, MD;  Location: [facility];  Service: Gastroenterology;  Laterality: N/A;   MASTECTOMY Right 2016   BREAST CA   TOTAL MASTECTOMY Left 05/29/2020   Procedure: TOTAL MASTECTOMY;  Surgeon: Sung Amabile, DO;  Location: [facility];  Service: General;  Laterality: Left;   TUBAL LIGATION      Social History: Social History   Socioeconomic History   Marital status: Single    Spouse name: Not on file   Number of children: Not on file   Years of education: Not on file   Highest education level: Not on file  Occupational History   Not on file  Tobacco Use   Smoking status: Former    Current packs/day: 0.00    Types: Cigarettes, E-cigarettes    Start date: 05/23/1999    Quit date: 05/23/2014    Years since quitting: 8.6   Smokeless tobacco: Former  Building services engineer status: Former  Substance and Sexual Activity   Alcohol use: No   Drug use: No   Sexual activity: Not on file  Other Topics Concern   Not on file  Social History Narrative   Not on file   Social Determinants of Health   Financial Resource Strain: Medium Risk (10/30/2021)   Overall Financial Resource Strain (CARDIA)    Difficulty of Paying Living Expenses: Somewhat hard  Food Insecurity: No Food Insecurity (10/30/2021)   Hunger Vital Sign    Worried About Running Out of Food in the Last Year: Never true    Ran Out of Food in the Last Year: Never  true  Transportation Needs: No Transportation Needs (10/30/2021)   PRAPARE - Administrator, Civil Service (Medical): No    Lack of Transportation (Non-Medical): No  Physical Activity: Inactive (10/30/2021)   Exercise Vital Sign    Days of Exercise per Week: 0 days    Minutes of Exercise per Session: 0 min  Stress: Stress Concern Present (10/30/2021)   [employer] of Occupational Health - Occupational Stress Questionnaire    Feeling of Stress : Rather much  Social Connections: Socially Integrated (10/30/2021)   Social Connection and Isolation Panel [NHANES]    Frequency of Communication with Friends and Family: Three times a week    Frequency  of Social Gatherings with Friends and Family: Twice a week    Attends Religious Services: 1 to 4 times per year    Active Member of [employer] or Organizations: Yes    Attends Banker Meetings: 1 to 4 times per year    Marital Status: Married  Catering manager Violence: Patient Declined (10/30/2021)   Humiliation, Afraid, Rape, and Kick questionnaire    Fear of Current or Ex-Partner: Patient declined    Emotionally Abused: Patient declined    Physically Abused: Patient declined    Sexually Abused: Patient declined    Family History: Family History  Problem Relation Age of Onset   Cancer Mother    Breast cancer Mother 29   Stroke Brother    Cancer Maternal Aunt     Review of Systems: ROS  Physical Exam: Vital Signs BP (!) 169/89 (BP Location: Left Arm, Patient Position: Sitting, Cuff Size: Large)   Pulse (!) 116   LMP 04/22/2019   SpO2 97%   Physical Exam Constitutional:      General: Not in acute distress.    Appearance: Normal appearance. Not ill-appearing.  HENT:     Head: Normocephalic and atraumatic.  Eyes:     Pupils: Pupils are equal, round Neck:     Musculoskeletal: Normal range of motion.  Cardiovascular:     Rate and Rhythm: Normal rate    Pulses: Normal pulses.  Pulmonary:     Effort:  Pulmonary effort is normal. No respiratory distress.  Abdominal:     General: Abdomen is flat. There is no distension.  Musculoskeletal: Normal range of motion.  Skin:    General: Skin is warm and dry.     Findings: No erythema or Walsh.  Neurological:     General: No focal deficit present.     Mental Status: Alert and oriented to person, place, and time. Mental status is at baseline.     Motor: No weakness.  Psychiatric:        Mood and Affect: Mood normal.        Behavior: Behavior normal.    Assessment/Plan: The patient is scheduled for expander to implant exchange with Dr. Ladona Ridgel.  Risks, benefits, and alternatives of procedure discussed, questions answered and consent obtained.    Smoking Status: Non-smoker; Counseling Given?  N/A  Caprini Score: 6; Risk Factors include: Age, history of cancer, BMI > 25, and length of planned surgery. Recommendation for mechanical prophylaxis. Encourage early ambulation.   Pictures obtained: 12/02/2022  Post-op Rx sent to pharmacy: Oxycodone, Zofran, Keflex  Patient was provided with the General Surgical Risk consent document and Pain Medication Agreement prior to their appointment.  They had adequate time to read through the risk consent documents and Pain Medication Agreement. We also discussed them in person together during this preop appointment. All of their questions were answered to their satisfaction.  Recommended calling if they have any further questions.  Risk consent form and Pain Medication Agreement to be scanned into patient's chart.  Patient was provided with the Mentor implant patient decision checklist and this was completed during today's preoperative evaluation. Patient had time to read through the information and any questions were answered to their content. Form will be scanned into patient's chart.  The risks that can be encountered with and after placement of a breast implant were discussed and include the following but not  limited to these: bleeding, infection, delayed healing, anesthesia risks, skin sensation changes, injury to structures including nerves, blood vessels,  and muscles which may be temporary or permanent, allergies to tape, suture materials and glues, blood products, topical preparations or injected agents, skin contour irregularities, skin discoloration and swelling, deep vein thrombosis, cardiac and pulmonary complications, pain, which may persist, fluid accumulation, wrinkling of the skin over the implanmt, changes in nipple or breast sensation, implant leakage or rupture, faulty position of the implant, persistent pain, formation of tight scar tissue around the implant (capsular contracture).  Recommend holding ibuprofen 5 days prior to surgery. Confirmed implants have been ordered  Electronically signed by: Kermit Balo Lynton Crescenzo, PA-C 01/17/2023 2:07 PM

## 2023-01-21 ENCOUNTER — Encounter (HOSPITAL_BASED_OUTPATIENT_CLINIC_OR_DEPARTMENT_OTHER): Payer: Self-pay | Admitting: Plastic Surgery

## 2023-01-21 ENCOUNTER — Other Ambulatory Visit: Payer: Self-pay

## 2023-01-21 NOTE — Progress Notes (Signed)
   01/21/23 1600  Pre-op Phone Call  Surgery Date Verified 02/07/23  Arrival Time Verified 0845  Surgery Location Verified [facility]  Medical History Reviewed Yes  Is the patient taking a GLP-1 receptor agonist? No  Does the patient have diabetes? No diagnosis of diabetes  Do you have a history of heart problems? No  Does patient have other implanted devices? No  Patient Teaching Enhanced Recovery;Pre / Post Procedure  Patient educated about smoking cessation 24 hours prior to surgery. N/A Non-Smoker  Patient verbalizes understanding of bowel prep? N/A  THA/TKA patients only:  By your surgery date, will you have been taking narcotics for 90 days or greater? No  Med Rec Completed Yes  Take the Following Meds the Morning of Surgery nothing  Recent  Lab Work, EKG, CXR? No  NPO (Including gum & candy) After midnight  Allowed clear liquids Water;Gatorade  (diabetics please choose diet or no sugar options)  Patient instructed to stop clear liquids including Carb loading drink at: 0715  Stop Solids, Milk, Candy, and Gum STARTING AT MIDNIGHT  Did patient view EMMI videos? No  Responsible adult to drive and be with you for 24 hours? Yes  Name & Phone Number for Ride/Caregiver husband vance  No Jewelry, money, nail polish or make-up.  No lotions, powders, perfumes. No shaving  48 hrs. prior to surgery. Yes  Contacts, Dentures & Glasses Will Have to be Removed Before OR. Yes  Please bring your ID and Insurance Card the morning of your surgery. (Surgery Centers Only) Yes  Bring any papers or x-rays with you that your surgeon gave you. Yes  Instructed to contact the location of procedure/ provider if they or anyone in their household develops symptoms or tests positive for COVID-19, has close contact with someone who tests positive for COVID, or has known exposure to any contagious illness. Yes  Call this number the morning of surgery  with any problems that may cancel your surgery. [phone]   Covid-19 Assessment  Have you had a positive COVID-19 test within the previous 90 days? No  COVID Testing Guidance Proceed with the additional questions.  Patient's surgery required a COVID-19 test (cardiothoracic, complex ENT, and bronchoscopies/ EBUS) No  Have you been unmasked and in close contact with anyone with COVID-19 or COVID-19 symptoms within the past 10 days? No  Do you or anyone in your household currently have any COVID-19 symptoms? No

## 2023-01-28 MED ORDER — CHLORHEXIDINE GLUCONATE CLOTH 2 % EX PADS: 6.0000 | MEDICATED_PAD | Freq: Once | CUTANEOUS | Status: DC

## 2023-01-28 NOTE — Progress Notes (Signed)

## 2023-02-07 ENCOUNTER — Other Ambulatory Visit: Payer: Self-pay

## 2023-02-07 ENCOUNTER — Ambulatory Visit (HOSPITAL_BASED_OUTPATIENT_CLINIC_OR_DEPARTMENT_OTHER): Payer: 59 | Admitting: Anesthesiology

## 2023-02-07 ENCOUNTER — Ambulatory Visit (HOSPITAL_BASED_OUTPATIENT_CLINIC_OR_DEPARTMENT_OTHER)
Admission: RE | Admit: 2023-02-07 | Discharge: 2023-02-07 | Disposition: A | Discharge status: Home / Self Care | Payer: 59 | Attending: Plastic Surgery | Admitting: Plastic Surgery

## 2023-02-07 ENCOUNTER — Encounter (HOSPITAL_BASED_OUTPATIENT_CLINIC_OR_DEPARTMENT_OTHER): Payer: Self-pay | Admitting: Plastic Surgery

## 2023-02-07 ENCOUNTER — Encounter (HOSPITAL_BASED_OUTPATIENT_CLINIC_OR_DEPARTMENT_OTHER)
Admission: RE | Disposition: A | Discharge status: Home / Self Care | Payer: Self-pay | Source: Home / Self Care | Attending: Plastic Surgery

## 2023-02-07 DIAGNOSIS — Z87891 Personal history of nicotine dependence: Secondary | ICD-10-CM | POA: Diagnosis not present

## 2023-02-07 DIAGNOSIS — Z8616 Personal history of COVID-19: Secondary | ICD-10-CM | POA: Insufficient documentation

## 2023-02-07 DIAGNOSIS — Z5986 Financial insecurity: Secondary | ICD-10-CM | POA: Insufficient documentation

## 2023-02-07 DIAGNOSIS — E669 Obesity, unspecified: Secondary | ICD-10-CM | POA: Insufficient documentation

## 2023-02-07 DIAGNOSIS — Z421 Encounter for breast reconstruction following mastectomy: Secondary | ICD-10-CM | POA: Insufficient documentation

## 2023-02-07 DIAGNOSIS — Z6836 Body mass index (BMI) 36.0-36.9, adult: Secondary | ICD-10-CM | POA: Insufficient documentation

## 2023-02-07 DIAGNOSIS — Z853 Personal history of malignant neoplasm of breast: Secondary | ICD-10-CM | POA: Insufficient documentation

## 2023-02-07 DIAGNOSIS — Z9013 Acquired absence of bilateral breasts and nipples: Secondary | ICD-10-CM | POA: Diagnosis not present

## 2023-02-07 DIAGNOSIS — J45909 Unspecified asthma, uncomplicated: Secondary | ICD-10-CM | POA: Diagnosis not present

## 2023-02-07 DIAGNOSIS — C50911 Malignant neoplasm of unspecified site of right female breast: Secondary | ICD-10-CM

## 2023-02-07 HISTORY — PX: REMOVAL OF TISSUE EXPANDER AND PLACEMENT OF IMPLANT: SHX6457

## 2023-02-07 SURGERY — REMOVAL, TISSUE EXPANDER, BREAST, WITH IMPLANT INSERTION
Anesthesia: General | Site: Breast | Laterality: Bilateral

## 2023-02-07 MED ORDER — ACETAMINOPHEN 500 MG PO TABS
1000.0000 mg | ORAL_TABLET | Freq: Once | ORAL | Status: AC
  Administered 2023-02-07: 1000 mg via ORAL

## 2023-02-07 MED ORDER — CEFAZOLIN SODIUM-DEXTROSE 2-4 GM/100ML-% IV SOLN
INTRAVENOUS | Status: AC
  Filled 2023-02-07: qty 100

## 2023-02-07 MED ORDER — ROCURONIUM BROMIDE 100 MG/10ML IV SOLN
INTRAVENOUS | Status: DC | PRN
  Administered 2023-02-07: 20 mg via INTRAVENOUS
  Administered 2023-02-07: 60 mg via INTRAVENOUS

## 2023-02-07 MED ORDER — PHENYLEPHRINE HCL (PRESSORS) 10 MG/ML IV SOLN
INTRAVENOUS | Status: DC | PRN
  Administered 2023-02-07 (×3): 160 ug via INTRAVENOUS
  Administered 2023-02-07: 80 ug via INTRAVENOUS
  Administered 2023-02-07: 160 ug via INTRAVENOUS
  Administered 2023-02-07: 80 ug via INTRAVENOUS
  Administered 2023-02-07: 160 ug via INTRAVENOUS

## 2023-02-07 MED ORDER — FENTANYL CITRATE (PF) 100 MCG/2ML IJ SOLN
25.0000 ug | INTRAMUSCULAR | Status: DC | PRN
Start: 2023-02-07 — End: 2023-02-07

## 2023-02-07 MED ORDER — SODIUM CHLORIDE (PF) 0.9 % IJ SOLN: INTRAMUSCULAR | Status: DC | PRN

## 2023-02-07 MED ORDER — PROPOFOL 10 MG/ML IV BOLUS
INTRAVENOUS | Status: DC | PRN
  Administered 2023-02-07: 170 mg via INTRAVENOUS

## 2023-02-07 MED ORDER — FENTANYL CITRATE (PF) 100 MCG/2ML IJ SOLN
INTRAMUSCULAR | Status: AC
  Filled 2023-02-07: qty 2

## 2023-02-07 MED ORDER — AMISULPRIDE (ANTIEMETIC) 5 MG/2ML IV SOLN: 10.0000 mg | Freq: Once | INTRAVENOUS | Status: DC | PRN

## 2023-02-07 MED ORDER — LIDOCAINE 2% (20 MG/ML) 5 ML SYRINGE
INTRAMUSCULAR | Status: AC
Start: 2023-02-07 — End: ?
  Filled 2023-02-07: qty 5

## 2023-02-07 MED ORDER — ONDANSETRON HCL 4 MG/2ML IJ SOLN
INTRAMUSCULAR | Status: AC
  Filled 2023-02-07: qty 2

## 2023-02-07 MED ORDER — SUGAMMADEX SODIUM 200 MG/2ML IV SOLN
INTRAVENOUS | Status: DC | PRN
  Administered 2023-02-07: 200 mg via INTRAVENOUS

## 2023-02-07 MED ORDER — MIDAZOLAM HCL 2 MG/2ML IJ SOLN
INTRAMUSCULAR | Status: AC
  Filled 2023-02-07: qty 2

## 2023-02-07 MED ORDER — SCOPOLAMINE 1 MG/3DAYS TD PT72
1.0000 | MEDICATED_PATCH | Freq: Once | TRANSDERMAL | Status: DC
  Administered 2023-02-07: 1.5 mg via TRANSDERMAL

## 2023-02-07 MED ORDER — DEXAMETHASONE SODIUM PHOSPHATE 10 MG/ML IJ SOLN
INTRAMUSCULAR | Status: AC
  Filled 2023-02-07: qty 1

## 2023-02-07 MED ORDER — ROCURONIUM BROMIDE 10 MG/ML (PF) SYRINGE
PREFILLED_SYRINGE | INTRAVENOUS | Status: AC
  Filled 2023-02-07: qty 10

## 2023-02-07 MED ORDER — POVIDONE-IODINE 10 % EX SOLN
CUTANEOUS | Status: DC | PRN
  Administered 2023-02-07: 1 via TOPICAL

## 2023-02-07 MED ORDER — SCOPOLAMINE 1 MG/3DAYS TD PT72
MEDICATED_PATCH | TRANSDERMAL | Status: AC
  Filled 2023-02-07: qty 1

## 2023-02-07 MED ORDER — MIDAZOLAM HCL 5 MG/5ML IJ SOLN
INTRAMUSCULAR | Status: DC | PRN
  Administered 2023-02-07: 2 mg via INTRAVENOUS

## 2023-02-07 MED ORDER — FENTANYL CITRATE (PF) 100 MCG/2ML IJ SOLN
INTRAMUSCULAR | Status: DC | PRN
  Administered 2023-02-07: 50 ug via INTRAVENOUS
  Administered 2023-02-07: 100 ug via INTRAVENOUS

## 2023-02-07 MED ORDER — PHENYLEPHRINE 80 MCG/ML (10ML) SYRINGE FOR IV PUSH (FOR BLOOD PRESSURE SUPPORT)
PREFILLED_SYRINGE | INTRAVENOUS | Status: AC
  Filled 2023-02-07: qty 10

## 2023-02-07 MED ORDER — LACTATED RINGERS IV SOLN: INTRAVENOUS | Status: DC

## 2023-02-07 MED ORDER — EPHEDRINE 5 MG/ML INJ
INTRAVENOUS | Status: AC
  Filled 2023-02-07: qty 5

## 2023-02-07 MED ORDER — CEFAZOLIN SODIUM-DEXTROSE 2-4 GM/100ML-% IV SOLN
2.0000 g | INTRAVENOUS | Status: AC
  Administered 2023-02-07: 2 g via INTRAVENOUS

## 2023-02-07 MED ORDER — ACETAMINOPHEN 500 MG PO TABS
ORAL_TABLET | ORAL | Status: AC
  Filled 2023-02-07: qty 2

## 2023-02-07 MED ORDER — PROPOFOL 10 MG/ML IV BOLUS
INTRAVENOUS | Status: AC
  Filled 2023-02-07: qty 20

## 2023-02-07 MED ORDER — SODIUM CHLORIDE 0.9 % IV SOLN: INTRAVENOUS | Status: DC | PRN

## 2023-02-07 MED ORDER — ONDANSETRON HCL 4 MG/2ML IJ SOLN: 4.0000 mg | Freq: Once | INTRAMUSCULAR | Status: DC | PRN

## 2023-02-07 MED ORDER — 0.9 % SODIUM CHLORIDE (POUR BTL) OPTIME
TOPICAL | Status: DC | PRN
  Administered 2023-02-07: 900 mL

## 2023-02-07 MED ORDER — LIDOCAINE HCL (CARDIAC) PF 100 MG/5ML IV SOSY
PREFILLED_SYRINGE | INTRAVENOUS | Status: DC | PRN
  Administered 2023-02-07: 80 mg via INTRAVENOUS

## 2023-02-07 MED ORDER — DEXAMETHASONE SODIUM PHOSPHATE 4 MG/ML IJ SOLN
INTRAMUSCULAR | Status: DC | PRN
  Administered 2023-02-07: 10 mg via INTRAVENOUS

## 2023-02-07 MED ORDER — ONDANSETRON HCL 4 MG/2ML IJ SOLN
INTRAMUSCULAR | Status: DC | PRN
  Administered 2023-02-07: 4 mg via INTRAVENOUS

## 2023-02-07 SURGICAL SUPPLY — 73 items
BAG DECANTER FOR FLEXI CONT (MISCELLANEOUS) ×1 IMPLANT
BINDER BREAST LRG (GAUZE/BANDAGES/DRESSINGS) IMPLANT
BINDER BREAST XLRG (GAUZE/BANDAGES/DRESSINGS) IMPLANT
BINDER BREAST XXLRG (GAUZE/BANDAGES/DRESSINGS) IMPLANT
BIOPATCH RED 1 DISK 7.0 (GAUZE/BANDAGES/DRESSINGS) IMPLANT
BLADE SURG 10 STRL SS (BLADE) IMPLANT
BLADE SURG 15 STRL LF DISP TIS (BLADE) ×1 IMPLANT
CANISTER SUCT 1200ML W/VALVE (MISCELLANEOUS) ×1 IMPLANT
DERMABOND ADVANCED .7 DNX12 (GAUZE/BANDAGES/DRESSINGS) IMPLANT
DRAIN CHANNEL 15F RND FF W/TCR (WOUND CARE) IMPLANT
DRSG MEPILEX POST OP 4X8 (GAUZE/BANDAGES/DRESSINGS) IMPLANT
ELECT BLADE 4.0 EZ CLEAN MEGAD (MISCELLANEOUS) ×1
ELECT COATED BLADE 2.86 ST (ELECTRODE) ×1 IMPLANT
ELECT REM PT RETURN 9FT ADLT (ELECTROSURGICAL) ×1
ELECTRODE BLDE 4.0 EZ CLN MEGD (MISCELLANEOUS) IMPLANT
ELECTRODE REM PT RTRN 9FT ADLT (ELECTROSURGICAL) ×1 IMPLANT
EVACUATOR SILICONE 100CC (DRAIN) IMPLANT
FUNNEL KELLER 2 DISP (MISCELLANEOUS) IMPLANT
GAUZE PAD ABD 8X10 STRL (GAUZE/BANDAGES/DRESSINGS) ×2 IMPLANT
GLOVE BIO SURGEON STRL SZ 6.5 (GLOVE) IMPLANT
GLOVE BIO SURGEON STRL SZ8 (GLOVE) ×1 IMPLANT
GLOVE BIOGEL M STRL SZ7.5 (GLOVE) IMPLANT
GLOVE BIOGEL PI IND STRL 6.5 (GLOVE) IMPLANT
GLOVE BIOGEL PI IND STRL 7.0 (GLOVE) IMPLANT
GLOVE BIOGEL PI IND STRL 7.5 (GLOVE) IMPLANT
GLOVE BIOGEL PI IND STRL 8 (GLOVE) ×1 IMPLANT
GLOVE SURG SS PI 6.5 STRL IVOR (GLOVE) IMPLANT
GOWN STRL REUS W/ TWL LRG LVL3 (GOWN DISPOSABLE) ×1 IMPLANT
GOWN STRL REUS W/ TWL XL LVL3 (GOWN DISPOSABLE) ×1 IMPLANT
GOWN STRL REUS W/TWL XL LVL3 (GOWN DISPOSABLE) ×1 IMPLANT
HIBICLENS CHG 4% 4OZ BTL (MISCELLANEOUS) ×1 IMPLANT
IMPL GEL 500CC (Breast) IMPLANT
IMPL GEL HI PROFILE 375CC (Breast) IMPLANT
IMPLANT GEL 500CC (Breast) ×1 IMPLANT
IMPLANT GEL HI PROFILE 375CC (Breast) ×1 IMPLANT
IV NS 500ML BAXH (IV SOLUTION) IMPLANT
KIT FILL ASEPTIC TRANSFER (MISCELLANEOUS) IMPLANT
MARKER SKIN DUAL TIP RULER LAB (MISCELLANEOUS) IMPLANT
NDL HYPO 22X1.5 SAFETY MO (MISCELLANEOUS) IMPLANT
NDL HYPO 25X1 1.5 SAFETY (NEEDLE) ×1 IMPLANT
NEEDLE HYPO 22X1.5 SAFETY MO (MISCELLANEOUS) ×1
NEEDLE HYPO 25X1 1.5 SAFETY (NEEDLE)
PACK BASIN DAY SURGERY FS (CUSTOM PROCEDURE TRAY) ×1 IMPLANT
PACK UNIVERSAL I (CUSTOM PROCEDURE TRAY) ×1 IMPLANT
PENCIL SMOKE EVACUATOR (MISCELLANEOUS) ×1 IMPLANT
PIN SAFETY STERILE (MISCELLANEOUS) IMPLANT
SIZER BREAST GEL REUSE 475CC (SIZER) ×1
SIZER BREAST HP REUSE 500CC (SIZER) ×1
SIZER BREAST REUSE 590CC (SIZER) ×1
SIZER BRST GEL REUSE 475CC (SIZER) IMPLANT
SIZER BRST HP REUSE 500CC (SIZER) IMPLANT
SIZER BRST REUSE 12.2 400CC (SIZER) IMPLANT
SIZER BRST REUSE 590CC (SIZER) IMPLANT
SIZER BRST REUSE P4.8 12 375CC (SIZER) IMPLANT
SIZER BRST REUSE ULT HI 535CC (SIZER) IMPLANT
SLEEVE SCD COMPRESS KNEE MED (STOCKING) ×1 IMPLANT
SPIKE FLUID TRANSFER (MISCELLANEOUS) IMPLANT
SPONGE T-LAP 18X18 ~~LOC~~+RFID (SPONGE) ×1 IMPLANT
STAPLER INSORB 30 2030 C-SECTI (MISCELLANEOUS) IMPLANT
STAPLER SKIN PROX WIDE 3.9 (STAPLE) ×1 IMPLANT
STRIP SUTURE WOUND CLOSURE 1/2 (MISCELLANEOUS) ×2 IMPLANT
SUT ETHIBOND 0 MO6 C/R (SUTURE) IMPLANT
SUT MNCRL AB 3-0 PS2 27 (SUTURE) ×2 IMPLANT
SUT MNCRL AB 4-0 PS2 18 (SUTURE) ×2 IMPLANT
SUT SILK 2 0 SH (SUTURE) IMPLANT
SUT VIC AB 3-0 SH 27X BRD (SUTURE) ×2 IMPLANT
SYR BULB IRRIG 60ML STRL (SYRINGE) ×1 IMPLANT
SYR CONTROL 10ML LL (SYRINGE) ×1 IMPLANT
TOWEL GREEN STERILE FF (TOWEL DISPOSABLE) ×2 IMPLANT
TRAY DSU PREP LF (CUSTOM PROCEDURE TRAY) IMPLANT
TUBE CONNECTING 20X1/4 (TUBING) ×1 IMPLANT
UNDERPAD 30X36 HEAVY ABSORB (UNDERPADS AND DIAPERS) ×2 IMPLANT
YANKAUER SUCT BULB TIP NO VENT (SUCTIONS) ×1 IMPLANT

## 2023-02-07 NOTE — Anesthesia Postprocedure Evaluation (Signed)
Anesthesia Post Note  Patient: Jill Walsh  Procedure(s) Performed: bilateral removal of tissue expanders and placement of implants (Bilateral: Breast)     Patient location during evaluation: PACU Anesthesia Type: General Level of consciousness: awake and alert Pain management: pain level controlled Vital Signs Assessment: post-procedure vital signs reviewed and stable Respiratory status: spontaneous breathing, nonlabored ventilation and respiratory function stable Cardiovascular status: blood pressure returned to baseline and stable Postop Assessment: no apparent nausea or vomiting Anesthetic complications: no   No notable events documented.  Last Vitals:  Vitals:   02/07/23 1330 02/07/23 1339  BP: (!) 137/91 (!) 144/88  Pulse: 83 80  Resp: 19 15  Temp:  36.9 C  SpO2: 94% 95%    Last Pain:  Vitals:   02/07/23 1339  TempSrc: Oral  PainSc: 0-No pain                 Collene Schlichter

## 2023-02-07 NOTE — Transfer of Care (Signed)
Immediate Anesthesia Transfer of Care Note  Patient: Shemia Laisure  Procedure(s) Performed: bilateral removal of tissue expanders and placement of implants (Bilateral: Breast)  Patient Location: PACU  Anesthesia Type:General  Level of Consciousness: drowsy, patient cooperative, and responds to stimulation  Airway & Oxygen Therapy: Patient Spontanous Breathing and Patient connected to face mask oxygen  Post-op Assessment: Report given to RN and Post -op Vital signs reviewed and stable  Post vital signs: Reviewed and stable  Last Vitals:  Vitals Value Taken Time  BP    Temp    Pulse 81 02/07/23 1312  Resp    SpO2 98 % 02/07/23 1312  Vitals shown include unfiled device data.  Last Pain:  Vitals:   02/07/23 0850  TempSrc: Temporal  PainSc: 0-No pain      Patients Stated Pain Goal: 5 (02/07/23 0850)  Complications: No notable events documented.

## 2023-02-07 NOTE — Anesthesia Procedure Notes (Signed)
Procedure Name: Intubation Date/Time: 02/07/2023 10:18 AM  Performed by: Thornell Mule, CRNAPre-anesthesia Checklist: Patient identified, Emergency Drugs available, Suction available and Patient being monitored Patient Re-evaluated:Patient Re-evaluated prior to induction Oxygen Delivery Method: Circle system utilized Preoxygenation: Pre-oxygenation with 100% oxygen Induction Type: IV induction Ventilation: Mask ventilation without difficulty Laryngoscope Size: Miller and 3 Grade View: Grade I Tube type: Oral Tube size: 7.0 mm Number of attempts: 1 Airway Equipment and Method: Stylet and Oral airway Placement Confirmation: ETT inserted through vocal cords under direct vision, positive ETCO2 and breath sounds checked- equal and bilateral Secured at: 21 cm Tube secured with: Tape Dental Injury: Teeth and Oropharynx as per pre-operative assessment

## 2023-02-07 NOTE — Interval H&P Note (Signed)
History and Physical Interval Note: No change in exam or indication for surgery  Surgical site marked with her assistance. All questions answered. Will proceed with removal of tissue expanders and replacement with permanent implants at her request.  02/07/2023 9:48 AM  Phil Dopp Goitia  has presented today for surgery, with the diagnosis of hx of breast cancer.  The various methods of treatment have been discussed with the patient and family. After consideration of risks, benefits and other options for treatment, the patient has consented to  Procedure(s): bilateral removal of tissue expanders and placement of implants (Bilateral) as a surgical intervention.  The patient's history has been reviewed, patient examined, no change in status, stable for surgery.  I have reviewed the patient's chart and labs.  Questions were answered to the patient's satisfaction.     Santiago Glad

## 2023-02-07 NOTE — Anesthesia Preprocedure Evaluation (Addendum)
Anesthesia Evaluation  Patient identified by MRN, date of birth, ID band Patient awake    Reviewed: Allergy & Precautions, NPO status , Patient's Chart, lab work & pertinent test results  Airway Mallampati: III  TM Distance: >3 FB Neck ROM: Full    Dental  (+) Dental Advisory Given, Lower Dentures, Upper Dentures   Pulmonary asthma , former smoker   Pulmonary exam normal breath sounds clear to auscultation       Cardiovascular negative cardio ROS Normal cardiovascular exam Rhythm:Regular Rate:Normal     Neuro/Psych negative neurological ROS  negative psych ROS   GI/Hepatic negative GI ROS, Neg liver ROS,,,  Endo/Other  Obesity   Renal/GU negative Renal ROS     Musculoskeletal negative musculoskeletal ROS (+)    Abdominal   Peds  Hematology negative hematology ROS (+)   Anesthesia Other Findings Day of surgery medications reviewed with the patient.  S/p Right mastectomy   Reproductive/Obstetrics                             Anesthesia Physical Anesthesia Plan  ASA: 2  Anesthesia Plan: General   Post-op Pain Management: Tylenol PO (pre-op)*   Induction: Intravenous  PONV Risk Score and Plan: 3 and Scopolamine patch - Pre-op, Midazolam, Dexamethasone and Ondansetron  Airway Management Planned: Oral ETT  Additional Equipment:   Intra-op Plan:   Post-operative Plan: Extubation in OR  Informed Consent: I have reviewed the patients History and Physical, chart, labs and discussed the procedure including the risks, benefits and alternatives for the proposed anesthesia with the patient or authorized representative who has indicated his/her understanding and acceptance.     Dental advisory given  Plan Discussed with: CRNA  Anesthesia Plan Comments:        Anesthesia Quick Evaluation

## 2023-02-07 NOTE — Discharge Instructions (Addendum)
May take Tylenol after 3pm, if needed.    Post Anesthesia Home Care Instructions  Activity: Get plenty of rest for the remainder of the day. A responsible individual must stay with you for 24 hours following the procedure.  For the next 24 hours, DO NOT: -Drive a car -Advertising copywriter -Drink alcoholic beverages -Take any medication unless instructed by your physician -Make any legal decisions or sign important papers.  Meals: Start with liquid foods such as gelatin or soup. Progress to regular foods as tolerated. Avoid greasy, spicy, heavy foods. If nausea and/or vomiting occur, drink only clear liquids until the nausea and/or vomiting subsides. Call your physician if vomiting continues.  Special Instructions/Symptoms: Your throat may feel dry or sore from the anesthesia or the breathing tube placed in your throat during surgery. If this causes discomfort, gargle with warm salt water. The discomfort should disappear within 24 hours.  If you had a scopolamine patch placed behind your ear for the management of post- operative nausea and/or vomiting:  1. The medication in the patch is effective for 72 hours, after which it should be removed.  Wrap patch in a tissue and discard in the trash. Wash hands thoroughly with soap and water. 2. You may remove the patch earlier than 72 hours if you experience unpleasant side effects which may include dry mouth, dizziness or visual disturbances. 3. Avoid touching the patch. Wash your hands with soap and water after contact with the patch.      Information for Discharge Teaching: EXPAREL (bupivacaine liposome injectable suspension)   Pain relief is important to your recovery. The goal is to control your pain so you can move easier and return to your normal activities as soon as possible after your procedure. Your physician may use several types of medicines to manage pain, swelling, and more.  Your surgeon or anesthesiologist gave you  EXPAREL(bupivacaine) to help control your pain after surgery.  EXPAREL is a local anesthetic designed to release slowly over an extended period of time to provide pain relief by numbing the tissue around the surgical site. EXPAREL is designed to release pain medication over time and can control pain for up to 72 hours. Depending on how you respond to EXPAREL, you may require less pain medication during your recovery. EXPAREL can help reduce or eliminate the need for opioids during the first few days after surgery when pain relief is needed the most. EXPAREL is not an opioid and is not addictive. It does not cause sleepiness or sedation.   Important! A teal colored band has been placed on your arm with the date, time and amount of EXPAREL you have received. Please leave this armband in place for the full 96 hours following administration, and then you may remove the band. If you return to the hospital for any reason within 96 hours following the administration of EXPAREL, the armband provides important information that your health care providers to know, and alerts them that you have received this anesthetic.    Possible side effects of EXPAREL: Temporary loss of sensation or ability to move in the area where medication was injected. Nausea, vomiting, constipation Rarely, numbness and tingling in your mouth or lips, lightheadedness, or anxiety may occur. Call your doctor right away if you think you may be experiencing any of these sensations, or if you have other questions regarding possible side effects.  Follow all other discharge instructions given to you by your surgeon or nurse. Eat a healthy diet and drink  plenty of water or other fluids.  INSTRUCTIONS FOR AFTER BREAST SURGERY   You will likely have some questions about what to expect following your operation.  The following information will help you and your family understand what to expect when you are discharged from the hospital.  It is  important to follow these guidelines to help ensure a smooth recovery and reduce complication.  Postoperative instructions include information on: diet, wound care, medications and physical activity.  AFTER SURGERY Expect to go home after the procedure.  In some cases, you may need to spend one night in the hospital for observation.  DIET Breast surgery does not require a specific diet.  However, the healthier you eat the better your body will heal. It is important to increasing your protein intake.  This means limiting the foods with sugar and carbohydrates.  Focus on vegetables and some meat.  If you have liposuction during your procedure be sure to drink water.  If your urine is bright yellow, then it is concentrated, and you need to drink more water.  As a general rule after surgery, you should have 8 ounces of water every hour while awake.  If you find you are persistently nauseated or unable to take in liquids let us know.  NO TOBACCO USE or EXPOSURE.  This will slow your healing process and lead to a wound.  WOUND CARE Leave the binder on at all times except when showering . Use fragrance free soap like Dial, Dove or [place].   After 48 hours you can remove the white mepilex border dressings and the binder to shower. Once dry apply binder or sports bra. No baths, pools or hot tubs for four weeks. We close your incision to leave the smallest and best-looking scar. No ointment or creams on your incisions for four weeks.  No Neosporin (Too many skin reactions).  A few weeks after surgery you can use Mederma and start massaging the scar. We ask you to wear your binder or sports bra for the first 6 weeks around the clock, including while sleeping. This provides added comfort and helps reduce the fluid accumulation at the surgery site. NO Ice or heating pads to the operative site.  You have a very high risk of a BURN before you feel the temperature change.  ACTIVITY No heavy lifting until cleared by  the doctor.  This usually means no more than a half-gallon of milk.  It is OK to walk and climb stairs. Moving your legs is very important to decrease your risk of a blood clot.  It will also help keep you from getting deconditioned.  Every 1 to 2 hours get up and walk for 5 minutes. This will help with a quicker recovery back to normal.  Let pain be your guide so you don't do too much.  This time is for you to recover.  You will be more comfortable if you sleep and rest with your head elevated either with a few pillows under you or in a recliner.  No stomach sleeping for a three months.  WORK Everyone returns to work at different times. As a rough guide, most people take at least 1 - 2 weeks off prior to returning to work. If you need documentation for your job, give the forms to the front staff at the clinic.  DRIVING Arrange for someone to bring you home from the hospital after your surgery.  You may be able to drive a few days after surgery  but not while taking any narcotics or valium.  BOWEL MOVEMENTS Constipation can occur after anesthesia and while taking pain medication.  It is important to stay ahead for your comfort.  We recommend taking Milk of Magnesia (2 tablespoons; twice a day) while taking the pain pills.  MEDICATIONS You may be prescribed should start after surgery At your preoperative visit for you history and physical you may have been given the following medications: Zofran 4 mg:  This is to treat nausea and vomiting.  You can take this every 6 hours as needed and only if needed. Oxycodone 5 mg:  This is only to be used after you have taken the Motrin or the Tylenol. Every 8 hours as needed. An Antibiotic (Bactrim): take as directed on the bottle.    Over the counter Medication to take: Ibuprofen (Motrin) 600 mg:  Take this every 6 hours.  If you have additional pain then take 500 mg of the Tylenol every 8 hours.  Only take the Norco after you have tried these two. MiraLAX or  Milk of Magnesia: Take this according to the bottle if you take the Norco.  WHEN TO CALL Call your surgeon's office if any of the following occur: Fever 101 degrees F or greater Excessive bleeding or fluid from the incision site. Pain that increases over time without aid from the medications Redness, warmth, or pus draining from incision sites Persistent nausea or inability to take in liquids Severe misshapen area that underwent the operation.  Here are some resources for breast cancer patients:  Plastic surgery website: https://www.plasticsurgery.org/for-medical-professionals/education-and-resources/publications/breast-reconstruction-magazine Breast Reconstruction Awareness Campaign:  [employer]fr Plastic surgery Implant information:  https://www.plasticsurgery.org/patient-safety/breast-implant-safety

## 2023-02-07 NOTE — Op Note (Signed)
DATE OF OPERATION: 02/07/2023  LOCATION: [facility] operating Room  PREOPERATIVE DIAGNOSIS: Breast cancer  POSTOPERATIVE DIAGNOSIS: Same  PROCEDURE: Removal of tissue expanders and placement of permanent silicone implants, capsulorrhaphy  SURGEON: Loren Racer, MD  ASSISTANT: Caroline More  EBL: 25 cc  CONDITION: Stable  COMPLICATIONS: None  INDICATION: The patient, Jill Walsh, is a 52 y.o. female born on 1970-12-14, is here for next stage and her breast reconstruction.Marland Kitchen   PROCEDURE DETAILS:  The patient was seen prior to surgery and marked.   IV antibiotics were given. The patient was taken to the operating room and given a general anesthetic. A standard time out was performed and all information was confirmed by those in the room. SCDs were placed.   Chest was prepped and draped in usual sterile manner.  The left breast was addressed first.  The previous scar was incised and dissection carried out down through the paucity of subcutaneous tissue to the level of the acellular dermal matrix.  The skin was elevated from the matrix cranially and then incised approximately 1 cm above the skin incision.  The tissue expander was located punctured and the saline removed.  The the tissue expander was removed and the capsule was inspected.  The lateral portion of the capsule was extremely tight and I incised this vertically and horizontally with the electrocautery to allow the tissue to open.  I also made incisions and the capsule on the upper medial portion of the breast.  I then placed an assortment of sizers in the breast to find one that match the right side.  I did place several interrupted 0 Ethibond sutures in the inframammary fold to make the fold somewhat more crisp and to allow for more ptosis of the implant over the fold.  I eventually selected a Mentor 500 cc high-profile gel implant.  The sizer was left in place and the skin tailor tacked over it.  Attention was then turned to  the right side.  Again the previous scar was incised and the subcutaneous tissues dissected down to the level of the capsule with the electrocautery.  The skin and fat was elevated from the capsule cranially and the capsule was incised and tissue expander was removed.  Again I placed an assortment of sizers in the breast and eventually selected a 375 cc high-profile gel implant.  I was unhappy with the lateral portion of the breast and excised a portion of the skin and the lateral skin and fat to recontour the breast laterally.  Once I was happy with the appearance of both breast the sizers were removed.  The pockets were infiltrated with a mixture of Exparel, quarter percent Marcaine, and saline.  30 mL of the mixture was infiltrated into each breast.  Hemostasis was achieved with electrocautery.  A dilute Betadine solution was used to irrigate the pockets.  The entire surgical team then changed gloves the skin was reprepped with Betadine and fresh sterile towels placed around the incisions.  The selected implants were placed with a Keller funnel and minimal touch technique.  The capsule was closed with 3-0 Vicryl sutures under direct visual visualization.  The subcutaneous tissues and dermis were closed with interrupted and running 3-0 Monocryl sutures and the skin was closed with a running 4-0 Monocryl subcuticular stitch.  The incisions were sealed with Dermabond.  The patient was placed in a supportive garment and awakened from anesthesia without incident she was transferred to the recovery room in good condition.  All  instrument needle and sponge counts were reported as correct at the conclusion of the case. The patient was allowed to wake up and taken to recovery room in stable condition at the end of the case. The family was notified at the end of the case.   The advanced practice practitioner (APP) assisted throughout the case.  The APP was essential in retraction and counter traction when needed to make  the case progress smoothly.  This retraction and assistance made it possible to see the tissue plans for the procedure.  The assistance was needed for blood control, tissue re-approximation and assisted with closure of the incision site.

## 2023-02-08 ENCOUNTER — Encounter (HOSPITAL_BASED_OUTPATIENT_CLINIC_OR_DEPARTMENT_OTHER): Payer: Self-pay | Admitting: Plastic Surgery

## 2023-02-14 ENCOUNTER — Encounter: Payer: 59 | Admitting: Plastic Surgery

## 2023-02-15 ENCOUNTER — Telehealth: Payer: Self-pay

## 2023-02-15 NOTE — Telephone Encounter (Signed)
 Patient is wanting to know when we will fax over her Short Term Disability paperwork.  Please f/u with patient

## 2023-02-15 NOTE — Telephone Encounter (Signed)
 These will be completed today

## 2023-02-17 ENCOUNTER — Telehealth: Payer: Self-pay | Admitting: Surgical

## 2023-02-17 NOTE — Telephone Encounter (Signed)
 FMLA paperwork has been fax, I also have a copy in my FMLA folder and message will be sent to pt.

## 2023-02-21 ENCOUNTER — Ambulatory Visit (INDEPENDENT_AMBULATORY_CARE_PROVIDER_SITE_OTHER): Payer: 59 | Admitting: Student

## 2023-02-21 ENCOUNTER — Encounter: Payer: Self-pay | Admitting: Student

## 2023-02-21 VITALS — BP 137/96 | HR 95

## 2023-02-21 DIAGNOSIS — Z9889 Other specified postprocedural states: Secondary | ICD-10-CM

## 2023-02-21 DIAGNOSIS — C50011 Malignant neoplasm of nipple and areola, right female breast: Secondary | ICD-10-CM

## 2023-02-21 NOTE — Progress Notes (Signed)
 Patient is a 53 year old female with history of breast cancer and bilateral reconstruction now s/p removal of tissue expanders and placement of silicone implants performed 02/07/2023 by Dr. Waddell who presents to clinic for postoperative follow-up.  Reviewed operative report and 500 cc Mentor high-profile gel implant placed on the left side and 375 cc high-profile gel implant on the right side.  Today, patient presents with her husband at bedside.  Patient reports overall she is doing really well.  She states that she has a little bit of discomfort on the right side, but it is overall tolerable.  She states that she has been predominantly taking Tylenol  for pain.  She states she has not taken any pain pill since Saturday.  She denies any fevers or chills.  Denies any other issues or concerns.  States that she is really happy with her outcome so far.  Chaperone present on exam.  On exam, patient is sitting upright in no acute distress.  Breasts are soft.  Implants in place bilaterally.  There is no overlying erythema.  No obvious fluid collections palpated on exam.  Incisions appear to be intact and healing well.  There are no signs of infection on exam.  Recommended patient apply Vaseline to her incisions daily.  Patient expressed understanding.  Discussed with patient that it is still normal at this time to feel little bit sore from time to time.  Recommended she continue to monitor the area closely and if she has any worsening pain, redness, fevers, chills or any concerning symptoms to let us  know.  Patient expressed understanding.  Discussed with patient to continue to wear compression at all times and avoid strenuous activities.  Patient to follow-up at her next scheduled appointment.  Discussed with her to call in the meantime she has new questions or concerns.

## 2023-02-21 NOTE — Progress Notes (Deleted)
 Patient is a 53 year old female with history of breast cancer and bilateral reconstruction now s/p removal of tissue expanders and placement of silicone implants performed 02/07/2023 by Dr. Waddell who presents to clinic for postoperative follow-up.  Reviewed operative report and 500 cc Mentor high-profile gel implant placed on the left side and 375 cc high-profile gel implant on the right side.

## 2023-02-22 ENCOUNTER — Telehealth: Payer: Self-pay

## 2023-02-22 NOTE — Telephone Encounter (Signed)
 Received correspondence from patient's FMLA/Disability provider, [employer] stating that pt has exhausted her FMLA benefits but has been approved for Short Term Disability through 03/20/23. They aren't requesting any documents or office notes at this time however, if it turns out she has to stay out of work longer than 03/20/23 they will probably want updated forms. According to the letter if her schedule for the week/return to work date differs than what is listed on the last claim pt will need to inform them by the website, [email or web address].[email or web address]/Medline or phone.   Correspondence labeled and sent to front desk for batch scanning.

## 2023-02-28 ENCOUNTER — Encounter: Payer: 59 | Admitting: Surgical

## 2023-03-08 ENCOUNTER — Encounter: Payer: Self-pay | Admitting: Surgical

## 2023-03-08 ENCOUNTER — Ambulatory Visit (INDEPENDENT_AMBULATORY_CARE_PROVIDER_SITE_OTHER): Payer: 59 | Admitting: Surgical

## 2023-03-08 ENCOUNTER — Encounter: Payer: 59 | Admitting: Surgical

## 2023-03-08 VITALS — BP 150/89 | HR 86 | Ht 65.0 in | Wt 190.0 lb

## 2023-03-08 DIAGNOSIS — C50012 Malignant neoplasm of nipple and areola, left female breast: Secondary | ICD-10-CM

## 2023-03-08 DIAGNOSIS — Z9889 Other specified postprocedural states: Secondary | ICD-10-CM

## 2023-03-08 DIAGNOSIS — C50011 Malignant neoplasm of nipple and areola, right female breast: Secondary | ICD-10-CM

## 2023-03-08 NOTE — Progress Notes (Signed)
Patient is a 53 year old female here for follow-up on her bilateral breast reconstruction.  She underwent tissue expander removal and placement of silicone implants with Dr. Ladona Ridgel on 02/07/2023.  She is with her husband today.  She had a 500 cc Mentor high-profile gel implant placed in the left and a 375 high-profile gel implant placed on the right.  Patient reports she is overall doing well today, reports she is having minimal pain.  She is recovering well from surgery.  She reports her range of motion is significantly improved.  She is not having any infectious symptoms at this time.  She does report some tenderness in the right lateral breast.  Chaperone present on exam On exam bilateral breast incisions are intact, bilateral mastectomy flaps are viable.  There is no erythema or cellulitic changes noted of either breast.  There is no subcutaneous fluid noted on exam. Bilateral breast implants are soft.  A/P:  Patient is overall doing well after expander to implant exchange.  There is no signs infection or concern on exam. Recommend following up in 2 weeks for 6-week postop follow-up.  At this time, recommend continue to avoid strenuous activities, heavy lifting, continue with compressive garments.  We did discuss that at 6 weeks she will have no restrictions and will be able to increase activity as tolerated.  Current lifting restrictions are 20 pounds or less.  Recommend establishing care with PCP to discuss any ongoing FMLA forms after 6-week period.

## 2023-03-08 NOTE — Progress Notes (Deleted)
Patient is a 53 year old female here for follow-up on her bilateral breast reconstruction.  She underwent tissue expander removal and placement of silicone implants with Dr. Ladona Ridgel on 02/07/2023.  She had a 500 cc Mentor high-profile gel implant placed in the left and a 375 high-profile gel implant placed on the right.

## 2023-03-22 ENCOUNTER — Ambulatory Visit (INDEPENDENT_AMBULATORY_CARE_PROVIDER_SITE_OTHER): Payer: 59 | Admitting: Surgical

## 2023-03-22 VITALS — BP 128/80 | HR 100

## 2023-03-22 DIAGNOSIS — Z9889 Other specified postprocedural states: Secondary | ICD-10-CM

## 2023-03-22 DIAGNOSIS — C50012 Malignant neoplasm of nipple and areola, left female breast: Secondary | ICD-10-CM

## 2023-03-22 DIAGNOSIS — C50011 Malignant neoplasm of nipple and areola, right female breast: Secondary | ICD-10-CM

## 2023-03-22 NOTE — Progress Notes (Signed)
   Referring Provider Duanne Limerick, MD [address] [place],  [place] [address]   CC:  Chief Complaint  Patient presents with   Post-op Follow-up      Jill Walsh is an 53 y.o. female.  HPI: Patient is a 53 year old female here for follow-up after removal of bilateral breast tissue expanders and placement of bilateral breast implants with Dr. Ladona Ridgel on 02/07/2023.  She is 6 weeks postop.  From a surgical standpoint she is healing well.  She does report that she is having some pain and tenderness to the right lateral breast after working 11-hour shifts.  She reports she works 11-hour shifts and is required to lift heavy objects.   She has been unable to see her PCP to discuss assistance with FMLA forms for extended period of time.    In regards to recovery from surgery, she is not having any infectious symptoms.  She is overall healing well without any specific concerns or complaints.   Physical Exam    03/22/2023    9:44 AM 03/08/2023    1:00 PM 02/21/2023   10:24 AM  Vitals with BMI  Height  5\' 5"    Weight  190 lbs   BMI  31.62   Systolic 128 150 604  Diastolic 80 89 96  Pulse 100 86 95    General:  No acute distress,  Alert and oriented, Non-Toxic, Normal speech and affect Bilateral breast: Bilateral breast incisions are intact, breast implants are soft.  No subcutaneous fluid collection noted palpation.  There is no overlying erythema or cellulitic changes of either breast.  She does have some mild tenderness of the right lateral breast with palpation.  Assessment/Plan Patient is a very pleasant 53 year old female status post bilateral breast reconstruction, she is 6 weeks postop from expander to implant placement.  She is overall healing well without any signs infection or concern.  She is having some pain after working at her job, seems to be related to repetitive lifting. Discussed referral to physical therapy for assistance with increasing strength and  lifting due to the nature of her job.  PT referral placed.  She has previously been seen by PT and [facility] and would like to go back there if possible.  Encouraged patient to follow-up with PCP to discuss options for long-term FMLA.  Discussed with patient that I am happy to help with any forms, but am not familiar with completing these forms longer than recommended for surgical recovery.  Patient was understanding of this.  I also discussed with the patient and her husband that I would reach out to [facility] to determine if they have access to social workers who may be of assistance. Addendum: Was able to obtain contact information for social worker at [facility], provided patient with phone number via MyChart message.  Did to call patient, patient did not answer.  Did not leave voicemail message.  Patient and her husband were appreciative, I would like for them to follow-up in 1 to 2 months for reevaluation.  Kermit Balo Shahrukh Pasch 03/22/2023, 10:25 AM

## 2023-03-25 ENCOUNTER — Telehealth: Payer: Self-pay | Admitting: *Deleted

## 2023-03-25 ENCOUNTER — Other Ambulatory Visit: Payer: Self-pay | Admitting: Surgical

## 2023-03-25 DIAGNOSIS — C50011 Malignant neoplasm of nipple and areola, right female breast: Secondary | ICD-10-CM

## 2023-03-25 DIAGNOSIS — Z9889 Other specified postprocedural states: Secondary | ICD-10-CM

## 2023-03-25 NOTE — Progress Notes (Signed)
Complex Care Management Note  Care Guide Note 03/25/2023 Name: Miniya Miguez MRN: [id number] DOB: 03-13-1970  Rosalita Carey is a 53 y.o. year old female who sees Duanne Limerick, MD for primary care. I reached out to Monte Fantasia by phone today to offer complex care management services.  Ms. Goar was given information about Complex Care Management services today including:   The Complex Care Management services include support from the care team which includes your Nurse Care Manager, Clinical Social Worker, or Pharmacist.  The Complex Care Management team is here to help remove barriers to the health concerns and goals most important to you. Complex Care Management services are voluntary, and the patient may decline or stop services at any time by request to their care team member.   Complex Care Management Consent Status: Patient agreed to services and verbal consent obtained.   Follow up plan:  Telephone appointment with complex care management team member scheduled for:  03/29/23  Encounter Outcome:  Patient Scheduled  Gwenevere Ghazi  [facility]  [facility], [facility] Guide  Direct Dial: [phone]  Fax [phone]

## 2023-03-29 ENCOUNTER — Ambulatory Visit: Payer: Self-pay | Admitting: *Deleted

## 2023-03-30 NOTE — Patient Outreach (Addendum)
  Care Coordination   Initial Visit Note   03/30/2023 Name: Jill Walsh MRN: [id number] DOB: Oct 31, 1970  Jill Walsh is a 53 y.o. year old female who sees Duanne Limerick, MD for primary care. I spoke with  Monte Fantasia by phone on 03/29/23.  What matters to the patients health and wellness today?  Patient states that her FMLA was terminated stating that she is not on any restrictions. Pt not currently followed by a PCP-currently working to get scheduled-CSW recommended follow up with PCP of choice to discuss extending FMLA if indicated.   Goals Addressed             This Visit's Progress    care coordination activities       Activities and task to complete in order to accomplish goals.   EMOTIONAL / MENTAL HEALTH SUPPORT Keep all upcoming appointment discussed today Continue with compliance of taking medication prescribed by Doctor Self Support options  (continue to utilize coping strategies dicussed today to manage symptoms of depression, schedule follow up with PCP of choice to discuss extension of FMLA, continue to consider ongoing mental health counseling if needed in the future )         SDOH assessments and interventions completed:  Yes  SDOH Interventions Today    Flowsheet Row Most Recent Value  SDOH Interventions   Food Insecurity Interventions Intervention Not Indicated  Housing Interventions Intervention Not Indicated  Transportation Interventions Intervention Not Indicated  Utilities Interventions Intervention Not Indicated        Care Coordination Interventions:  Yes, provided  Interventions Today    Flowsheet Row Most Recent Value  Chronic Disease   Chronic disease during today's visit Other  [HX breast cancer-S/P breast reconstruction, bilateral]  General Interventions   General Interventions Discussed/Reviewed General Interventions Discussed, Doctor Visits, Level of Care  [pt assessed for community resource needs. confirms HX of breast  cancer/surgeries following double mastectomy-confirms challenges with  last surgery-could not raise arm which affected ability to work]  Doctor Visits Discussed/Reviewed Doctor Visits Discussed  [recommended follow up with PCP-no longer sees PCP indicated-currenlty looking for a new provider-extender surgery possibly scheduled for the end of the year]  Level of Care Personal Care Services  [confirms postive support system, spouse and sister supportive-assists with all care needs]  Mental Health Interventions   Mental Health Discussed/Reviewed Mental Health Discussed, Coping Strategies, Depression  [confirmed that pt has new job, currently not in restriction, FMLA has been denied-copes with avoiding heavy lifting, tylenol/ibuprofen, looking into other employment options-self care and safety emohsaized-emottional support provided]  Safety Interventions   Safety Discussed/Reviewed Safety Discussed  [denies thoughts of harm to self or others-agrees to contact 988 in the event of a mental health emergency]       Follow up plan: No further intervention required.   Encounter Outcome:  Patient Visit Completed

## 2023-03-30 NOTE — Patient Instructions (Signed)
Visit Information  Thank you for taking time to visit with me today. Please don't hesitate to contact me if I can be of assistance to you.   Following are the goals we discussed today:   Goals Addressed             This Visit's Progress    care coordination activities       Activities and task to complete in order to accomplish goals.   EMOTIONAL / MENTAL HEALTH SUPPORT Keep all upcoming appointment discussed today Continue with compliance of taking medication prescribed by Doctor Self Support options  (continue to utilize coping strategies dicussed today to manage symptoms of depression, schedule follow up with PCP of choice to discuss extension of FMLA, continue to consider ongoing mental health counseling if needed in the future )         If you are experiencing a Mental Health or Behavioral Health Crisis or need someone to talk to, please call the Suicide and Crisis Lifeline: 988   Patient verbalizes understanding of instructions and care plan provided today and agrees to view in MyChart. Active MyChart status and patient understanding of how to access instructions and care plan via MyChart confirmed with patient.     No further follow up required: patient to contact this Child psychotherapist with any additional community resource needs  [employer]  [facility], [facility] Licensed Clinical Social Geologist, engineering Dial: [phone]

## 2023-05-19 NOTE — Progress Notes (Unsigned)
   Referring Provider Duanne Limerick, MD [address] [place],  [place] [address]   CC: No chief complaint on file.     Jill Walsh is an 53 y.o. female.  HPI: 53 year old female here for follow-up after removal of bilateral breast tissue expanders and placement of breast implants with Dr. Ladona Ridgel on 02/07/2023.  She is 3-1/2 months postop.  Patient had a Mentor memory gel smooth round high-profile gel 375 cc implant placed on the right. Patient had a Mentor memory gel smooth round high profile 500 cc implant placed on the left.  Review of Systems General: ***  Physical Exam    03/22/2023    9:44 AM 03/08/2023    1:00 PM 02/21/2023   10:24 AM  Vitals with BMI  Height  5\' 5"    Weight  190 lbs   BMI  31.62   Systolic 128 150 841  Diastolic 80 89 96  Pulse 100 86 95    General:  No acute distress,  Alert and oriented, Non-Toxic, Normal speech and affect ***   Assessment/Plan ***  Jill Walsh Jill Walsh 05/19/2023, 4:59 PM

## 2023-05-20 ENCOUNTER — Ambulatory Visit: Payer: 59 | Admitting: Surgical

## 2023-05-20 DIAGNOSIS — N651 Disproportion of reconstructed breast: Secondary | ICD-10-CM

## 2023-05-20 DIAGNOSIS — Z9013 Acquired absence of bilateral breasts and nipples: Secondary | ICD-10-CM

## 2023-05-20 DIAGNOSIS — C50011 Malignant neoplasm of nipple and areola, right female breast: Secondary | ICD-10-CM

## 2023-05-20 DIAGNOSIS — Z9889 Other specified postprocedural states: Secondary | ICD-10-CM

## 2023-06-10 ENCOUNTER — Telehealth: Payer: Self-pay

## 2023-06-10 NOTE — Telephone Encounter (Signed)
 The patient called in to schedule her repeat colonoscopy.

## 2023-06-10 NOTE — Telephone Encounter (Signed)
 The patient called and left a voicemail requesting to schedule her procedure. I returned her call to confirm that we received her voicemail.  The patient mentioned that she had already spoken with Moira Andrews regarding her procedure.

## 2023-06-12 ENCOUNTER — Encounter: Payer: Self-pay | Admitting: Emergency Medicine

## 2023-06-12 ENCOUNTER — Ambulatory Visit
Admission: EM | Admit: 2023-06-12 | Discharge: 2023-06-12 | Disposition: A | Discharge status: Home / Self Care | Attending: Emergency Medicine | Admitting: Emergency Medicine

## 2023-06-12 DIAGNOSIS — K2990 Gastroduodenitis, unspecified, without bleeding: Secondary | ICD-10-CM | POA: Insufficient documentation

## 2023-06-12 DIAGNOSIS — K2970 Gastritis, unspecified, without bleeding: Secondary | ICD-10-CM | POA: Insufficient documentation

## 2023-06-12 DIAGNOSIS — K219 Gastro-esophageal reflux disease without esophagitis: Secondary | ICD-10-CM | POA: Diagnosis present

## 2023-06-12 LAB — COMPREHENSIVE METABOLIC PANEL WITH GFR
ALT: 37 U/L (ref 0–44)
AST: 32 U/L (ref 15–41)
Albumin: 4.1 g/dL (ref 3.5–5.0)
Alkaline Phosphatase: 89 U/L (ref 38–126)
Anion gap: 12 (ref 5–15)
BUN: 11 mg/dL (ref 6–20)
CO2: 25 mmol/L (ref 22–32)
Calcium: 10.5 mg/dL — ABNORMAL HIGH (ref 8.9–10.3)
Chloride: 100 mmol/L (ref 98–111)
Creatinine, Ser: 0.92 mg/dL (ref 0.44–1.00)
GFR, Estimated: >60 mL/min
Glucose, Bld: 105 mg/dL — ABNORMAL HIGH (ref 70–99)
Potassium: 3.9 mmol/L (ref 3.5–5.1)
Sodium: 137 mmol/L (ref 135–145)
Total Bilirubin: 0.4 mg/dL (ref 0.0–1.2)
Total Protein: 9 g/dL — ABNORMAL HIGH (ref 6.5–8.1)

## 2023-06-12 LAB — CBC WITH DIFFERENTIAL/PLATELET
Abs Immature Granulocytes: 0.04 10*3/uL (ref 0.00–0.07)
Basophils Absolute: 0.1 10*3/uL (ref 0.0–0.1)
Basophils Relative: 0 %
Eosinophils Absolute: 0.1 10*3/uL (ref 0.0–0.5)
Eosinophils Relative: 1 %
HCT: 41.9 % (ref 36.0–46.0)
Hemoglobin: 13.9 g/dL (ref 12.0–15.0)
Immature Granulocytes: 0 %
Lymphocytes Relative: 23 %
Lymphs Abs: 3 10*3/uL (ref 0.7–4.0)
MCH: 26.9 pg (ref 26.0–34.0)
MCHC: 33.2 g/dL (ref 30.0–36.0)
MCV: 81 fL (ref 80.0–100.0)
Monocytes Absolute: 0.7 10*3/uL (ref 0.1–1.0)
Monocytes Relative: 5 %
Neutro Abs: 9.4 10*3/uL — ABNORMAL HIGH (ref 1.7–7.7)
Neutrophils Relative %: 71 %
Platelets: 331 10*3/uL (ref 150–400)
RBC: 5.17 MIL/uL — ABNORMAL HIGH (ref 3.87–5.11)
RDW: 14.9 % (ref 11.5–15.5)
WBC: 13.3 10*3/uL — ABNORMAL HIGH (ref 4.0–10.5)
nRBC: 0 % (ref 0.0–0.2)

## 2023-06-12 LAB — LIPASE, BLOOD: Lipase: 20 U/L (ref 11–51)

## 2023-06-12 MED ORDER — FAMOTIDINE 20 MG PO TABS: 20.0000 mg | ORAL_TABLET | Freq: Two times a day (BID) | ORAL | 1 refills | Status: AC

## 2023-06-12 MED ORDER — ONDANSETRON 4 MG PO TBDP
4.0000 mg | ORAL_TABLET | Freq: Once | ORAL | Status: AC
  Administered 2023-06-12: 4 mg via ORAL

## 2023-06-12 MED ORDER — OMEPRAZOLE 20 MG PO CPDR
20.0000 mg | DELAYED_RELEASE_CAPSULE | Freq: Every day | ORAL | 2 refills | Status: DC
Start: 2023-06-12 — End: 2023-08-02

## 2023-06-12 MED ORDER — ONDANSETRON 8 MG PO TBDP: 8.0000 mg | ORAL_TABLET | Freq: Three times a day (TID) | ORAL | 0 refills | Status: DC | PRN

## 2023-06-12 MED ORDER — ALUM & MAG HYDROXIDE-SIMETH 200-200-20 MG/5ML PO SUSP
30.0000 mL | Freq: Once | ORAL | Status: AC
  Administered 2023-06-12: 30 mL via ORAL

## 2023-06-12 MED ORDER — LIDOCAINE VISCOUS HCL 2 % MT SOLN
15.0000 mL | Freq: Once | OROMUCOSAL | Status: AC
  Administered 2023-06-12: 15 mL via OROMUCOSAL

## 2023-06-12 NOTE — Discharge Instructions (Addendum)
 Take the omeprazole at bedtime daily.  Avoid eating greasy, spicy, or acidic foods as this may intensify your symptoms.  Avoid eating within 4 hours of bedtime. Take the Pepcid  20 mg twice daily to help with your symptoms.  Take the Zofran  every 8 hours as needed for nausea and vomiting.  If your symptoms do not improve you need to see your PCP for further evaluation and possible breath testing to rule out H. pylori infection.  If you develop worsening abdominal pain, nausea and vomiting not controlled by the Zofran , start running fevers, or start vomiting blood or passing blood in your stool you need to go to the ER for evaluation.

## 2023-06-12 NOTE — ED Triage Notes (Signed)
 Pt c/o epigastric pain, diarrhea and vomiting. Started yesterday. She states she took tums and tylenol  without relief.

## 2023-06-12 NOTE — ED Provider Notes (Signed)
 [facility]    CSN: [id number] Arrival date & time: 06/12/23  4098      History   Chief Complaint Chief Complaint  Patient presents with   Emesis   Abdominal Pain    HPI Jill Walsh is a 53 y.o. female.   HPI  53 year old female with past medical history significant for asthma, breast cancer status post right mastectomy and reconstruction, and who is a former smoker presents for evaluation of epigastric pain, nausea, vomiting, and diarrhea that started yesterday morning at 8 AM.  She reports that the pain is constant and will repeat for short period if she takes Tums or Tylenol .  She also took a full-strength Bayer aspirin yesterday which did not improve her symptoms.  She has been able to drink fluids and keep them down but if she eats it makes her nauseous and vomit.  She does not endorse any worsening of her pain or improvement of her pain with food.  She has had approximately 3 episodes of diarrhea every day and she has been monitoring to see if there is any blood and has not noticed any in her vomit or stool.  She denies fever.  She does report that when she wakes up in the morning she has a sour taste in her mouth.  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   Family history of colon cancer 04/23/2019   Simple chronic bronchitis (HCC) 05/30/2017   Breast pain 12/20/2014   Family history of breast cancer 05/28/2014   Current smoker 05/28/2014   Breast cancer (HCC) 05/28/2014   Breast cancer, right breast (HCC) 03/28/2014    Past Surgical History:  Procedure Laterality Date   BREAST BIOPSY Left    02/28/14 negative   BREAST BIOPSY Right     02/2014 malignant   BREAST RECONSTRUCTION WITH PLACEMENT OF TISSUE EXPANDER AND FLEX HD (ACELLULAR HYDRATED DERMIS) Bilateral 06/29/2022    Procedure: BREAST RECONSTRUCTION WITH PLACEMENT OF TISSUE EXPANDER AND FLEX HD (ACELLULAR HYDRATED DERMIS);  Surgeon: Teretha Ferguson, MD;  Location: [facility];  Service: Plastics;  Laterality: Bilateral;   COLONOSCOPY WITH PROPOFOL  N/A 11/13/2018   Procedure: COLONOSCOPY WITH PROPOFOL ;  Surgeon: Luke Salaam, MD;  Location: [facility];  Service: Gastroenterology;  Laterality: N/A;   MASTECTOMY Right 2016   BREAST CA   REMOVAL OF TISSUE EXPANDER AND PLACEMENT OF IMPLANT Bilateral 02/07/2023   Procedure: bilateral removal of tissue expanders and placement of implants;  Surgeon: Teretha Ferguson, MD;  Location: [facility];  Service: Plastics;  Laterality: Bilateral;   TOTAL MASTECTOMY Left 05/29/2020   Procedure: TOTAL MASTECTOMY;  Surgeon: Conrado Delay, DO;  Location: [facility];  Service: General;  Laterality: Left;   TUBAL LIGATION      OB History   No obstetric history on file.      Home Medications    Prior to Admission medications   Medication Sig Start Date End Date Taking? Authorizing Provider  famotidine  (PEPCID ) 20 MG tablet Take 1 tablet (20 mg total) by mouth 2 (two) times daily. 06/12/23  Yes Kent Pear, NP  omeprazole (PRILOSEC) 20 MG capsule Take 1 capsule (20 mg total) by mouth daily. 06/12/23  Yes Kent Pear, NP  ondansetron  (ZOFRAN -ODT) 8 MG disintegrating tablet Take  1 tablet (8 mg total) by mouth every 8 (eight) hours as needed for nausea or vomiting. 06/12/23  Yes Kent Pear, NP  Acetaminophen  (TYLENOL  8 HOUR PO) Take by mouth.    [provider]  ipratropium-albuterol  (DUONEB) 0.5-2.5 (3) MG/3ML SOLN Take 3 mLs by nebulization every 6 (six) hours as needed. 03/23/18 12/27/18  Clarise Crooks, MD    Family History Family History  Problem Relation Age of Onset   Cancer Mother    Breast cancer Mother 55   Stroke Brother    Cancer Maternal Aunt     Social History Social History   Tobacco Use   Smoking status: Former     Current packs/day: 0.00    Types: Cigarettes, E-cigarettes    Start date: 05/23/1999    Quit date: 05/23/2014    Years since quitting: 9.0   Smokeless tobacco: Former  Building services engineer status: Former  Substance Use Topics   Alcohol use: No   Drug use: No     Allergies   Patient has no known allergies.   Review of Systems Review of Systems  Constitutional:  Negative for fever.  HENT:         Sour taste in mouth upon waking.  Gastrointestinal:  Positive for abdominal pain, diarrhea, nausea and vomiting. Negative for blood in stool.     Physical Exam Triage Vital Signs ED Triage Vitals  Encounter Vitals Group     BP 06/12/23 0947 (!) 140/100     Systolic BP Percentile --      Diastolic BP Percentile --      Pulse Rate 06/12/23 0947 (!) 108     Resp 06/12/23 0947 16     Temp 06/12/23 0947 98.8 F (37.1 C)     Temp Source 06/12/23 0947 Oral     SpO2 06/12/23 0947 95 %     Weight 06/12/23 0946 190 lb 0.6 oz (86.2 kg)     Height 06/12/23 0946 5\' 5"  (1.651 m)     Head Circumference --      Peak Flow --      Pain Score 06/12/23 0945 9     Pain Loc --      Pain Education --      Exclude from Growth Chart --    No data found.  Updated Vital Signs BP (!) 140/100 (BP Location: Right Arm)   Pulse (!) 108   Temp 98.8 F (37.1 C) (Oral)   Resp 16   Ht 5\' 5"  (1.651 m)   Wt 190 lb 0.6 oz (86.2 kg)   LMP 04/22/2019   SpO2 95%   BMI 31.62 kg/m   Visual Acuity Right Eye Distance:   Left Eye Distance:   Bilateral Distance:    Right Eye Near:   Left Eye Near:    Bilateral Near:     Physical Exam Vitals and nursing note reviewed.  Constitutional:      General: She is in acute distress.     Appearance: Normal appearance. She is not ill-appearing.     Comments: Patient appears to be in moderate degree of discomfort.  HENT:     Head: Normocephalic and atraumatic.  Cardiovascular:     Rate and Rhythm: Normal rate and regular rhythm.     Pulses: Normal pulses.      Heart sounds: Normal heart sounds. No murmur heard.    No friction rub. No gallop.  Pulmonary:     Effort: Pulmonary effort  is normal.     Breath sounds: Normal breath sounds. No wheezing, rhonchi or rales.  Abdominal:     Palpations: Abdomen is soft.     Tenderness: There is abdominal tenderness. There is no guarding or rebound.  Skin:    General: Skin is warm and dry.     Capillary Refill: Capillary refill takes less than 2 seconds.     Findings: No rash.  Neurological:     General: No focal deficit present.     Mental Status: She is alert and oriented to person, place, and time.      UC Treatments / Results  Labs (all labs ordered are listed, but only abnormal results are displayed) Labs Reviewed  CBC WITH DIFFERENTIAL/PLATELET - Abnormal; Notable for the following components:      Result Value   WBC 13.3 (*)    RBC 5.17 (*)    Neutro Abs 9.4 (*)    All other components within normal limits  COMPREHENSIVE METABOLIC PANEL WITH GFR - Abnormal; Notable for the following components:   Glucose, Bld 105 (*)    Calcium 10.5 (*)    Total Protein 9.0 (*)    All other components within normal limits  LIPASE, BLOOD    EKG   Radiology No results found.  Procedures Procedures (including critical care time)  Medications Ordered in UC Medications  ondansetron  (ZOFRAN -ODT) disintegrating tablet 4 mg (4 mg Oral Given 06/12/23 0951)  alum & mag hydroxide-simeth (MAALOX/MYLANTA) 200-200-20 MG/5ML suspension 30 mL (30 mLs Oral Given 06/12/23 1002)  lidocaine  (XYLOCAINE ) 2 % viscous mouth solution 15 mL (15 mLs Mouth/Throat Given 06/12/23 1002)    Initial Impression / Assessment and Plan / UC Course  I have reviewed the triage vital signs and the nursing notes.  Pertinent labs & imaging results that were available during my care of the patient were reviewed by me and considered in my medical decision making (see chart for details).   Patient is a pleasant 53 year old female who  appears to be in a moderate degree of discomfort in her epigastrium presenting for evaluation of epigastric burning along with nausea, vomiting, and diarrhea as outlined HPI above.  Her symptoms began abruptly yesterday morning at 8 AM when she woke up.  She denies any contacts with others with GI symptoms and she denies eating anything that she thought tasted funny or suspect.  She did eat spaghetti the night before her symptoms began.  She has woken up with a sour taste in her mouth last 2 mornings as well.  On exam she does have tenderness in the epigastrium as well as tenderness in the right and left upper quadrants.  No guarding or rebound.  Lower abdomen is benign.  Differential diagnosis includes GERD, peptic ulcer disease, cholecystitis, pancreatitis, viral GI illness.  I will order form Zofran  to be administered to help the patient with nausea as well as a GI cocktail to see if it improves the burning in her stomach.  I will also order belly labs to evaluate for any evidence of gallbladder disease or pancreatitis.  Also any evidence of systemic infection.  CBC, CMP, and lipase have been ordered.  Review of records in epic reveals that patient had a colonoscopy in October 2020 which did not show any evidence of diverticulitis, polyps, hemorrhoids, or inflammatory disease of the colon.  No EGD on record.  CBC shows an elevated white count of 13.3.  RBC count is also mildly elevated at 5.15.  H&H  is normal at 13.9 and 41.9 respectively.  Platelets are normal at 231.  Mildly elevated neutrophil number of 9.4 but no other abnormalities to differential.  CMP shows mildly elevated glucose of 105 as well as elevated calcium 10.5 and elevated total protein of 9.0.  Electrolytes, renal function, transaminases are unremarkable.  Lipase is normal at 20.  The patient reports that the pain has improved with the GI cocktail.  Given that her belly labs are normal I suspect that she has gastritis and/or reflux  disease.  The elevation of the white blood cell count is most likely secondary to inflammation.  Patient may also be experiencing H. pylori infection.  I will treat her for gastritis and GERD and start her on omeprazole 20 mg at bedtime daily along with famotidine  20 mg twice daily to help cut down acid production in her stomach.  I will also have her follow a bland diet for the next few days and avoid acidic and spicy foods as this will increase irritation to her stomach mucosa.  If her symptoms do not improve she is to follow-up with primary care provider for breath testing for possible H. pylori.  If she has any sharp increase in abdominal pain, nausea vomiting despite Zofran , starts vomiting blood, passing blood in her stool, or develops fever she needs to go to the ER.  Work note provided.   Final Clinical Impressions(s) / UC Diagnoses   Final diagnoses:  Gastritis and gastroduodenitis  Gastroesophageal reflux disease without esophagitis     Discharge Instructions      Take the omeprazole at bedtime daily.  Avoid eating greasy, spicy, or acidic foods as this may intensify your symptoms.  Avoid eating within 4 hours of bedtime. Take the Pepcid  20 mg twice daily to help with your symptoms.  Take the Zofran  every 8 hours as needed for nausea and vomiting.  If your symptoms do not improve you need to see your PCP for further evaluation and possible breath testing to rule out H. pylori infection.  If you develop worsening abdominal pain, nausea and vomiting not controlled by the Zofran , start running fevers, or start vomiting blood or passing blood in your stool you need to go to the ER for evaluation.     ED Prescriptions     Medication Sig Dispense Auth. Provider   omeprazole (PRILOSEC) 20 MG capsule Take 1 capsule (20 mg total) by mouth daily. 30 capsule Kent Pear, NP   famotidine  (PEPCID ) 20 MG tablet Take 1 tablet (20 mg total) by mouth 2 (two) times daily. 60 tablet Kent Pear, NP   ondansetron  (ZOFRAN -ODT) 8 MG disintegrating tablet Take 1 tablet (8 mg total) by mouth every 8 (eight) hours as needed for nausea or vomiting. 20 tablet Kent Pear, NP      PDMP not reviewed this encounter.   Kent Pear, NP 06/12/23 1042

## 2023-06-14 ENCOUNTER — Ambulatory Visit: Payer: Self-pay

## 2023-06-14 NOTE — Telephone Encounter (Signed)
 Chief Complaint: abdominal pain Symptoms: above plus vomiting and diarrhea Frequency: since Saturday  Pertinent Negatives: Patient denies CP Disposition: [x] ED /[] Urgent Care (no appt availability in office) / [] Appointment(In office/virtual)/ []  [facility]/ [] Home Care/ [] Refused Recommended Disposition /[] [facility]/ []  Follow-up with PCP Additional Notes: Pt reports abdominal pain, nausea, vomiting, and diarrhea since Saturday. Pt went to UC on Sunday. Pt endorses 4-5 episodes of watery stool per day and states she is unable to eat without vomiting. Pt states she was able to keep down some milk today. Pt endorses she drank milk d/t heartburn. Pt states the pain is to the top of her abdomen and radiates to the R side and to her back. Pain comes and goes. RN advised pt she should go to the ED. Pt agreeable to that plan and states she will go to [facility] and that her husband will take her. Pt verbalized understanding of the plan.    Copied from CRM [id number]. Topic: Clinical - Red Word Triage >> Jun 14, 2023  9:24 AM Oddis Bench wrote: Red Word that prompted transfer to Nurse Triage: Patient is stting that she is having stomach pain, throwing up with runny bowels. She also states that she went to urgent care on Sunday. Reason for Disposition  Patient sounds very sick or weak to the triager  Answer Assessment - Initial Assessment Questions 1. LOCATION: "Where does it hurt?"      Top to the R side 2. RADIATION: "Does the pain shoot anywhere else?" (e.g., chest, back)     Endorses that pain radiates to her back  3. ONSET: "When did the pain begin?" (e.g., minutes, hours or days ago)      Saturday 4. SUDDEN: "Gradual or sudden onset?"     States she woke up on Saturday with pain 5. PATTERN "Does the pain come and go, or is it constant?"    - If it comes and goes: "How long does it last?" "Do you have pain now?"     (Note: Comes and goes means the pain is intermittent. It  goes away completely between bouts.)    - If constant: "Is it getting better, staying the same, or getting worse?"      (Note: Constant means the pain never goes away completely; most serious pain is constant and gets worse.)      7/10 - took ibuprofen  recently 6. SEVERITY: "How bad is the pain?"  (e.g., Scale 1-10; mild, moderate, or severe)    - MILD (1-3): Doesn't interfere with normal activities, abdomen soft and not tender to touch.     - MODERATE (4-7): Interferes with normal activities or awakens from sleep, abdomen tender to touch.     - SEVERE (8-10): Excruciating pain, doubled over, unable to do any normal activities.       7/10 comes and goes 7. RECURRENT SYMPTOM: "Have you ever had this type of stomach pain before?" If Yes, ask: "When was the last time?" and "What happened that time?"      No  8. CAUSE: "What do you think is causing the stomach pain?"     Not sure  9. RELIEVING/AGGRAVATING FACTORS: "What makes it better or worse?" (e.g., antacids, bending or twisting motion, bowel movement)     N/a  10. OTHER SYMPTOMS: "Do you have any other symptoms?" (e.g., back pain, diarrhea, fever, urination pain, vomiting)       "If I eat something I throw it back up" (states  she has been drinking milk because she had heartburn at the top of her stomach - kept that down), last kept food down Saturday. Also endorses diarrhea - 4-5x/day. Endorses it is runny  Protocols used: Abdominal Pain - Female-A-AH

## 2023-06-14 NOTE — Telephone Encounter (Signed)
Noted   Pt told to go to ED.  KP 

## 2023-06-20 ENCOUNTER — Encounter: Payer: Self-pay | Admitting: Surgical

## 2023-06-20 ENCOUNTER — Ambulatory Visit (INDEPENDENT_AMBULATORY_CARE_PROVIDER_SITE_OTHER): Admitting: Surgical

## 2023-06-20 VITALS — BP 163/104 | HR 104

## 2023-06-20 DIAGNOSIS — C50011 Malignant neoplasm of nipple and areola, right female breast: Secondary | ICD-10-CM

## 2023-06-20 DIAGNOSIS — N651 Disproportion of reconstructed breast: Secondary | ICD-10-CM | POA: Diagnosis not present

## 2023-06-20 DIAGNOSIS — Z9013 Acquired absence of bilateral breasts and nipples: Secondary | ICD-10-CM

## 2023-06-20 DIAGNOSIS — Z9889 Other specified postprocedural states: Secondary | ICD-10-CM

## 2023-06-20 NOTE — Progress Notes (Signed)
   Referring Provider Clarise Crooks, MD [address] [place],  [place] [address]   CC:  Chief Complaint  Patient presents with   Follow-up     Jill Walsh is an 53 y.o. female.  HPI: 53 year old female here for follow-up after bilateral breast reconstruction.  She had silicone breast implants placed with Dr. Carolynne Citron on 02/07/2023.  She is just shy of 5 months postop.  RIGHT: Mentor memory gel smooth round high-profile gel 375 cc implant LEFT: Mentor memory gel smooth round high-profile 500 cc implant  She reports she is overall doing well in regards to her breast, she is dealing with some epigastric pain/gastritis.  She reports that it has improved a little bit, but she is having difficulty with eating.  She reports she has noticed her blood pressures have increased due to pain from her gastritis.  Is doing well.  She reports she is interested in proceeding with fat grafting to bilateral breast.  Physical Exam    06/20/2023    9:52 AM 06/12/2023    9:47 AM 06/12/2023    9:46 AM  Vitals with BMI  Height   5\' 5"   Weight   190 lbs 1 oz  BMI   31.62  Systolic 163 140   Diastolic 104 100   Pulse 104 108     General:  No acute distress,  Alert and oriented, Non-Toxic, Normal speech and affect Breast: Bilateral breast incisions are intact, healing well.  She does have some volume loss in the superior medial pole of the left breast.  There is no erythema or cellulitic changes noted.  Bilateral breast implants are soft.  Assessment/Plan Discussed with patient that I would discuss with Dr. Carolynne Citron, we previously have taken photos.  She has volume loss in the superior medial breast on the left side and may benefit from fat grafting.  All of her questions answered to her content.  We discussed fat grafting and involves liposuction typically of the abdomen, placing the fat in the breast through small incisions.  We discussed that not all of the fat can always "take" which could  require additional procedures    Janalyn Me Archita Lomeli 06/20/2023, 10:01 AM

## 2023-06-23 ENCOUNTER — Ambulatory Visit: Admitting: Surgical

## 2023-06-23 DIAGNOSIS — C50012 Malignant neoplasm of nipple and areola, left female breast: Secondary | ICD-10-CM

## 2023-06-23 DIAGNOSIS — L905 Scar conditions and fibrosis of skin: Secondary | ICD-10-CM

## 2023-06-23 DIAGNOSIS — Z9889 Other specified postprocedural states: Secondary | ICD-10-CM

## 2023-06-23 DIAGNOSIS — C50011 Malignant neoplasm of nipple and areola, right female breast: Secondary | ICD-10-CM

## 2023-06-23 NOTE — Progress Notes (Signed)
 Patient is interested in fat grafting to reconstructed breasts, she is particularly interested in the left superior medial aspect.  I spoke with Dr. Carolynne Citron in regards to this, he would like to see patient to evaluate and discuss procedure.  I discussed this with patient she was agreeable to follow-up in office in a few weeks for evaluation.  The patient gave consent to have this visit done by telemedicine / virtual visit, two identifiers were used to identify patient. This is also consent for access the chart and treat the patient via this visit. The patient is located in [place].  I, the provider, am at the office.  We spent 2 minutes together for the visit.  Joined by telephone.

## 2023-07-06 ENCOUNTER — Ambulatory Visit (INDEPENDENT_AMBULATORY_CARE_PROVIDER_SITE_OTHER): Admitting: Plastic Surgery

## 2023-07-06 ENCOUNTER — Encounter: Payer: Self-pay | Admitting: Plastic Surgery

## 2023-07-06 VITALS — BP 125/83 | HR 98

## 2023-07-06 DIAGNOSIS — L905 Scar conditions and fibrosis of skin: Secondary | ICD-10-CM

## 2023-07-06 DIAGNOSIS — N651 Disproportion of reconstructed breast: Secondary | ICD-10-CM | POA: Diagnosis not present

## 2023-07-06 DIAGNOSIS — Z9889 Other specified postprocedural states: Secondary | ICD-10-CM

## 2023-07-06 NOTE — Progress Notes (Signed)
 Jill Walsh returns today 5 months postop from removal of her tissue expanders and placement of implants.  She is doing very well.  She has no limitation with movement of the left upper extremity.  She still has some excess skin especially along the lower portion of the right breast.  There is some asymmetry with the right breast slightly larger than the left.  The medial aspect of the left breast is somewhat effaced especially when she is sitting.  We discussed her overall results.  While she is happy especially with the improvement in movement in the left arm she has asked if there is anything we can be done for symmetry and for overall improvement of the appearance of the breast.  I believe that revision of the scars may give her some improvement.  I do not believe that I can do any significant amount of fat grafting to the left breast due to the tightness of the envelope around the implant.  She understands that while I believe this is a very low risk procedure there is always a chance of infection and if she has an infection around the implant the entire reconstruction will be lost.  All questions were answered to her satisfaction today.  Will schedule her for revision of the scars and revision of the reconstruction at her request.  I spent 30 minutes reviewing the patient's chart examining the patient discussing surgical options coordinating care and documenting.

## 2023-07-21 ENCOUNTER — Encounter: Payer: Self-pay | Admitting: Surgical

## 2023-07-21 ENCOUNTER — Ambulatory Visit: Admitting: Surgical

## 2023-07-21 VITALS — BP 161/92 | HR 88 | Wt 201.0 lb

## 2023-07-21 DIAGNOSIS — C50012 Malignant neoplasm of nipple and areola, left female breast: Secondary | ICD-10-CM

## 2023-07-21 DIAGNOSIS — Z9889 Other specified postprocedural states: Secondary | ICD-10-CM

## 2023-07-21 DIAGNOSIS — C50011 Malignant neoplasm of nipple and areola, right female breast: Secondary | ICD-10-CM

## 2023-07-21 DIAGNOSIS — L905 Scar conditions and fibrosis of skin: Secondary | ICD-10-CM

## 2023-07-21 MED ORDER — OXYCODONE-ACETAMINOPHEN 5-325 MG PO TABS: 1.0000 | ORAL_TABLET | Freq: Four times a day (QID) | ORAL | 0 refills | Status: AC | PRN

## 2023-07-21 NOTE — Progress Notes (Signed)
 Patient ID: Jill Walsh, female    DOB: September 03, 1970, 53 y.o.   MRN: [id number]  Chief Complaint  Patient presents with   Pre-op Exam    No diagnosis found.   History of Present Illness: Jill Walsh is a 53 y.o.  female  with a history of bilateral breast reconstruction with implants in place.  She presents for preoperative evaluation for upcoming procedure, revision of bilateral breast reconstruction, scheduled for 08/09/2023 with Dr. Carolynne Citron.  The patient has not had problems with anesthesia. No history of DVT/PE.  No family history of DVT/PE.  No family or personal history of bleeding or clotting disorders.  Patient is not currently taking any blood thinners.  No history of CVA/MI.   Summary of Previous Visit: Dr. Carolynne Citron evaluated patient, discussed revision of breast reconstruction discussed recommendations against fat grafting to the left breast, but did discuss revision of scars may be helpful for improvement.  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  PMH Significant for: GERD, bilateral mastectomy, asthma.  She reports her asthma is well-controlled.  She does not have a history of any cardiac or pulmonary disease, she does have elevated blood pressure today, but reports that her blood pressure is not normally elevated.  She denies any headaches or vision changes or any other symptomatic changes.  BP at her last appointment was 125/83.    Past Medical History: Allergies: No Known Allergies  Current Medications:  Current Outpatient Medications:    Acetaminophen  (TYLENOL  8 HOUR PO), Take by mouth., Disp: , Rfl:    famotidine  (PEPCID ) 20 MG tablet, Take 1 tablet (20 mg total) by mouth 2 (two) times daily., Disp: 60 tablet, Rfl: 1   omeprazole  (PRILOSEC) 20 MG capsule, Take 1 capsule (20 mg total) by mouth daily., Disp: 30 capsule, Rfl: 2   ondansetron   (ZOFRAN -ODT) 8 MG disintegrating tablet, Take 1 tablet (8 mg total) by mouth every 8 (eight) hours as needed for nausea or vomiting., Disp: 20 tablet, Rfl: 0  Past Medical Problems: Past Medical History:  Diagnosis Date   Asthma    Breast cancer (HCC) 2016   RT MASTECTOMY    Past Surgical History: Past Surgical History:  Procedure Laterality Date   BREAST BIOPSY Left    02/28/14 negative   BREAST BIOPSY Right     02/2014 malignant   BREAST RECONSTRUCTION WITH PLACEMENT OF TISSUE EXPANDER AND FLEX HD (ACELLULAR HYDRATED DERMIS) Bilateral 06/29/2022   Procedure: BREAST RECONSTRUCTION WITH PLACEMENT OF TISSUE EXPANDER AND FLEX HD (ACELLULAR HYDRATED DERMIS);  Surgeon: Teretha Ferguson, MD;  Location: [facility];  Service: Plastics;  Laterality: Bilateral;   COLONOSCOPY WITH PROPOFOL  N/A 11/13/2018   Procedure: COLONOSCOPY WITH PROPOFOL ;  Surgeon: Luke Salaam, MD;  Location: [facility];  Service: Gastroenterology;  Laterality: N/A;   MASTECTOMY Right 2016   BREAST CA   REMOVAL OF TISSUE EXPANDER AND PLACEMENT OF IMPLANT Bilateral 02/07/2023   Procedure: bilateral removal of tissue expanders and placement of implants;  Surgeon: Teretha Ferguson, MD;  Location: [facility];  Service: Plastics;  Laterality: Bilateral;   TOTAL MASTECTOMY Left 05/29/2020   Procedure: TOTAL MASTECTOMY;  Surgeon: Conrado Delay, DO;  Location: [facility];  Service: General;  Laterality: Left;   TUBAL LIGATION      Social History: Social History   Socioeconomic History   Marital status: Single  Spouse name: Not on file   Number of children: Not on file   Years of education: Not on file   Highest education level: Not on file  Occupational History   Not on file  Tobacco Use   Smoking status: Former    Current packs/day: 0.00    Types: Cigarettes, E-cigarettes    Start date: 05/23/1999    Quit date: 05/23/2014    Years since quitting: 9.1   Smokeless tobacco: Former  Water quality scientist status: Former  Substance and Sexual Activity   Alcohol use: No   Drug use: No   Sexual activity: Not on file  Other Topics Concern   Not on file  Social History Narrative   Not on file   Social Drivers of Health   Financial Resource Strain: Medium Risk (10/30/2021)   Overall Financial Resource Strain (CARDIA)    Difficulty of Paying Living Expenses: Somewhat hard  Food Insecurity: No Food Insecurity (03/29/2023)   Hunger Vital Sign    Worried About Running Out of Food in the Last Year: Never true    Ran Out of Food in the Last Year: Never true  Transportation Needs: No Transportation Needs (03/29/2023)   PRAPARE - Administrator, Civil Service (Medical): No    Lack of Transportation (Non-Medical): No  Physical Activity: Inactive (10/30/2021)   Exercise Vital Sign    Days of Exercise per Week: 0 days    Minutes of Exercise per Session: 0 min  Stress: Stress Concern Present (10/30/2021)   [employer] of Occupational Health - Occupational Stress Questionnaire    Feeling of Stress : Rather much  Social Connections: Socially Integrated (10/30/2021)   Social Connection and Isolation Panel    Frequency of Communication with Friends and Family: Three times a week    Frequency of Social Gatherings with Friends and Family: Twice a week    Attends Religious Services: 1 to 4 times per year    Active Member of [employer] or Organizations: Yes    Attends Banker Meetings: 1 to 4 times per year    Marital Status: Married  Catering manager Violence: Not At Risk (03/29/2023)   Humiliation, Afraid, Rape, and Kick questionnaire    Fear of Current or Ex-Partner: No    Emotionally Abused: No    Physically Abused: No    Sexually Abused: No    Family History: Family History  Problem Relation Age of Onset   Cancer Mother    Breast cancer Mother 71   Stroke Brother    Cancer Maternal Aunt     Review of Systems: Review of Systems  Constitutional:  Negative.   Respiratory: Negative.    Cardiovascular: Negative.   Gastrointestinal: Negative.   Neurological: Negative.     Physical Exam: Vital Signs LMP 04/22/2019   Physical Exam Constitutional:      General: Not in acute distress.    Appearance: Normal appearance. Not ill-appearing.  HENT:     Head: Normocephalic and atraumatic.  Eyes:     Pupils: Pupils are equal, round Neck:     Musculoskeletal: Normal range of motion.  Cardiovascular:     Rate and Rhythm: Normal rate    Pulses: Normal pulses.  Pulmonary:     Effort: Pulmonary effort is normal. No respiratory distress.  Abdominal:     General: Abdomen is flat. There is no distension.  Musculoskeletal: Normal range of motion.  Skin:    General: Skin is warm  and dry.     Findings: No erythema or rash.  Neurological:     General: No focal deficit present.     Mental Status: Alert and oriented to person, place, and time. Mental status is at baseline.     Motor: No weakness.  Psychiatric:        Mood and Affect: Mood normal.        Behavior: Behavior normal.    Assessment/Plan: The patient is scheduled for revision of bilateral breast reconstruction with Dr. Carolynne Citron.  Risks, benefits, and alternatives of procedure discussed, questions answered and consent obtained.    Smoking Status: Non-smoker, but is exposed to secondhand smoke at home; Counseling Given?  Discussed avoiding secondhand smoke exposure due to increased risk of complications Last Mammogram: Status post mastectomy  Caprini Score: 6; Risk Factors include: Age, history of cancer, BMI > 25, and length of planned surgery. Recommendation for mechanical prophylaxis. Encourage early ambulation.   Post-op Rx sent to pharmacy: Percocet  Patient was provided with the General Surgical Risk consent document and Pain Medication Agreement prior to their appointment.  They had adequate time to read through the risk consent documents and Pain Medication Agreement. We  also discussed them in person together during this preop appointment. All of their questions were answered to their satisfaction.  Recommended calling if they have any further questions.  Risk consent form and Pain Medication Agreement to be scanned into patient's chart.  We discussed risk specific to breast reconstruction revision including risk of infection could compromise reconstruction  Electronically signed by: Janalyn Me Altheia Shafran, PA-C 07/21/2023 12:59 PM

## 2023-07-21 NOTE — H&P (View-Only) (Signed)
 Patient ID: Jill Walsh, female    DOB: September 03, 1970, 53 y.o.   MRN: [id number]  Chief Complaint  Patient presents with   Pre-op Exam    No diagnosis found.   History of Present Illness: Jill Walsh is a 53 y.o.  female  with a history of bilateral breast reconstruction with implants in place.  She presents for preoperative evaluation for upcoming procedure, revision of bilateral breast reconstruction, scheduled for 08/09/2023 with Dr. Carolynne Citron.  The patient has not had problems with anesthesia. No history of DVT/PE.  No family history of DVT/PE.  No family or personal history of bleeding or clotting disorders.  Patient is not currently taking any blood thinners.  No history of CVA/MI.   Summary of Previous Visit: Dr. Carolynne Citron evaluated patient, discussed revision of breast reconstruction discussed recommendations against fat grafting to the left breast, but did discuss revision of scars may be helpful for improvement.  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  PMH Significant for: GERD, bilateral mastectomy, asthma.  She reports her asthma is well-controlled.  She does not have a history of any cardiac or pulmonary disease, she does have elevated blood pressure today, but reports that her blood pressure is not normally elevated.  She denies any headaches or vision changes or any other symptomatic changes.  BP at her last appointment was 125/83.    Past Medical History: Allergies: No Known Allergies  Current Medications:  Current Outpatient Medications:    Acetaminophen  (TYLENOL  8 HOUR PO), Take by mouth., Disp: , Rfl:    famotidine  (PEPCID ) 20 MG tablet, Take 1 tablet (20 mg total) by mouth 2 (two) times daily., Disp: 60 tablet, Rfl: 1   omeprazole  (PRILOSEC) 20 MG capsule, Take 1 capsule (20 mg total) by mouth daily., Disp: 30 capsule, Rfl: 2   ondansetron   (ZOFRAN -ODT) 8 MG disintegrating tablet, Take 1 tablet (8 mg total) by mouth every 8 (eight) hours as needed for nausea or vomiting., Disp: 20 tablet, Rfl: 0  Past Medical Problems: Past Medical History:  Diagnosis Date   Asthma    Breast cancer (HCC) 2016   RT MASTECTOMY    Past Surgical History: Past Surgical History:  Procedure Laterality Date   BREAST BIOPSY Left    02/28/14 negative   BREAST BIOPSY Right     02/2014 malignant   BREAST RECONSTRUCTION WITH PLACEMENT OF TISSUE EXPANDER AND FLEX HD (ACELLULAR HYDRATED DERMIS) Bilateral 06/29/2022   Procedure: BREAST RECONSTRUCTION WITH PLACEMENT OF TISSUE EXPANDER AND FLEX HD (ACELLULAR HYDRATED DERMIS);  Surgeon: Teretha Ferguson, MD;  Location: [facility];  Service: Plastics;  Laterality: Bilateral;   COLONOSCOPY WITH PROPOFOL  N/A 11/13/2018   Procedure: COLONOSCOPY WITH PROPOFOL ;  Surgeon: Luke Salaam, MD;  Location: [facility];  Service: Gastroenterology;  Laterality: N/A;   MASTECTOMY Right 2016   BREAST CA   REMOVAL OF TISSUE EXPANDER AND PLACEMENT OF IMPLANT Bilateral 02/07/2023   Procedure: bilateral removal of tissue expanders and placement of implants;  Surgeon: Teretha Ferguson, MD;  Location: [facility];  Service: Plastics;  Laterality: Bilateral;   TOTAL MASTECTOMY Left 05/29/2020   Procedure: TOTAL MASTECTOMY;  Surgeon: Conrado Delay, DO;  Location: [facility];  Service: General;  Laterality: Left;   TUBAL LIGATION      Social History: Social History   Socioeconomic History   Marital status: Single  Spouse name: Not on file   Number of children: Not on file   Years of education: Not on file   Highest education level: Not on file  Occupational History   Not on file  Tobacco Use   Smoking status: Former    Current packs/day: 0.00    Types: Cigarettes, E-cigarettes    Start date: 05/23/1999    Quit date: 05/23/2014    Years since quitting: 9.1   Smokeless tobacco: Former  Water quality scientist status: Former  Substance and Sexual Activity   Alcohol use: No   Drug use: No   Sexual activity: Not on file  Other Topics Concern   Not on file  Social History Narrative   Not on file   Social Drivers of Health   Financial Resource Strain: Medium Risk (10/30/2021)   Overall Financial Resource Strain (CARDIA)    Difficulty of Paying Living Expenses: Somewhat hard  Food Insecurity: No Food Insecurity (03/29/2023)   Hunger Vital Sign    Worried About Running Out of Food in the Last Year: Never true    Ran Out of Food in the Last Year: Never true  Transportation Needs: No Transportation Needs (03/29/2023)   PRAPARE - Administrator, Civil Service (Medical): No    Lack of Transportation (Non-Medical): No  Physical Activity: Inactive (10/30/2021)   Exercise Vital Sign    Days of Exercise per Week: 0 days    Minutes of Exercise per Session: 0 min  Stress: Stress Concern Present (10/30/2021)   [employer] of Occupational Health - Occupational Stress Questionnaire    Feeling of Stress : Rather much  Social Connections: Socially Integrated (10/30/2021)   Social Connection and Isolation Panel    Frequency of Communication with Friends and Family: Three times a week    Frequency of Social Gatherings with Friends and Family: Twice a week    Attends Religious Services: 1 to 4 times per year    Active Member of [employer] or Organizations: Yes    Attends Banker Meetings: 1 to 4 times per year    Marital Status: Married  Catering manager Violence: Not At Risk (03/29/2023)   Humiliation, Afraid, Rape, and Kick questionnaire    Fear of Current or Ex-Partner: No    Emotionally Abused: No    Physically Abused: No    Sexually Abused: No    Family History: Family History  Problem Relation Age of Onset   Cancer Mother    Breast cancer Mother 71   Stroke Brother    Cancer Maternal Aunt     Review of Systems: Review of Systems  Constitutional:  Negative.   Respiratory: Negative.    Cardiovascular: Negative.   Gastrointestinal: Negative.   Neurological: Negative.     Physical Exam: Vital Signs LMP 04/22/2019   Physical Exam Constitutional:      General: Not in acute distress.    Appearance: Normal appearance. Not ill-appearing.  HENT:     Head: Normocephalic and atraumatic.  Eyes:     Pupils: Pupils are equal, round Neck:     Musculoskeletal: Normal range of motion.  Cardiovascular:     Rate and Rhythm: Normal rate    Pulses: Normal pulses.  Pulmonary:     Effort: Pulmonary effort is normal. No respiratory distress.  Abdominal:     General: Abdomen is flat. There is no distension.  Musculoskeletal: Normal range of motion.  Skin:    General: Skin is warm  and dry.     Findings: No erythema or rash.  Neurological:     General: No focal deficit present.     Mental Status: Alert and oriented to person, place, and time. Mental status is at baseline.     Motor: No weakness.  Psychiatric:        Mood and Affect: Mood normal.        Behavior: Behavior normal.    Assessment/Plan: The patient is scheduled for revision of bilateral breast reconstruction with Dr. Carolynne Citron.  Risks, benefits, and alternatives of procedure discussed, questions answered and consent obtained.    Smoking Status: Non-smoker, but is exposed to secondhand smoke at home; Counseling Given?  Discussed avoiding secondhand smoke exposure due to increased risk of complications Last Mammogram: Status post mastectomy  Caprini Score: 6; Risk Factors include: Age, history of cancer, BMI > 25, and length of planned surgery. Recommendation for mechanical prophylaxis. Encourage early ambulation.   Post-op Rx sent to pharmacy: Percocet  Patient was provided with the General Surgical Risk consent document and Pain Medication Agreement prior to their appointment.  They had adequate time to read through the risk consent documents and Pain Medication Agreement. We  also discussed them in person together during this preop appointment. All of their questions were answered to their satisfaction.  Recommended calling if they have any further questions.  Risk consent form and Pain Medication Agreement to be scanned into patient's chart.  We discussed risk specific to breast reconstruction revision including risk of infection could compromise reconstruction  Electronically signed by: Janalyn Me Altheia Shafran, PA-C 07/21/2023 12:59 PM

## 2023-08-02 ENCOUNTER — Encounter (HOSPITAL_BASED_OUTPATIENT_CLINIC_OR_DEPARTMENT_OTHER): Payer: Self-pay | Admitting: Plastic Surgery

## 2023-08-02 ENCOUNTER — Other Ambulatory Visit: Payer: Self-pay

## 2023-08-09 ENCOUNTER — Encounter (HOSPITAL_BASED_OUTPATIENT_CLINIC_OR_DEPARTMENT_OTHER): Payer: Self-pay | Admitting: Plastic Surgery

## 2023-08-09 ENCOUNTER — Ambulatory Visit (HOSPITAL_BASED_OUTPATIENT_CLINIC_OR_DEPARTMENT_OTHER): Admitting: Anesthesiology

## 2023-08-09 ENCOUNTER — Encounter (HOSPITAL_BASED_OUTPATIENT_CLINIC_OR_DEPARTMENT_OTHER)
Admission: RE | Disposition: A | Discharge status: Home / Self Care | Payer: Self-pay | Source: Home / Self Care | Attending: Plastic Surgery

## 2023-08-09 ENCOUNTER — Ambulatory Visit (HOSPITAL_BASED_OUTPATIENT_CLINIC_OR_DEPARTMENT_OTHER)
Admission: RE | Admit: 2023-08-09 | Discharge: 2023-08-09 | Disposition: A | Discharge status: Home / Self Care | Attending: Plastic Surgery | Admitting: Plastic Surgery

## 2023-08-09 ENCOUNTER — Other Ambulatory Visit: Payer: Self-pay

## 2023-08-09 DIAGNOSIS — N651 Disproportion of reconstructed breast: Secondary | ICD-10-CM

## 2023-08-09 DIAGNOSIS — N6489 Other specified disorders of breast: Secondary | ICD-10-CM | POA: Diagnosis not present

## 2023-08-09 DIAGNOSIS — Z87891 Personal history of nicotine dependence: Secondary | ICD-10-CM | POA: Insufficient documentation

## 2023-08-09 DIAGNOSIS — Z853 Personal history of malignant neoplasm of breast: Secondary | ICD-10-CM | POA: Diagnosis not present

## 2023-08-09 DIAGNOSIS — J45909 Unspecified asthma, uncomplicated: Secondary | ICD-10-CM | POA: Insufficient documentation

## 2023-08-09 DIAGNOSIS — Z9013 Acquired absence of bilateral breasts and nipples: Secondary | ICD-10-CM | POA: Insufficient documentation

## 2023-08-09 DIAGNOSIS — N62 Hypertrophy of breast: Secondary | ICD-10-CM

## 2023-08-09 DIAGNOSIS — K219 Gastro-esophageal reflux disease without esophagitis: Secondary | ICD-10-CM | POA: Diagnosis not present

## 2023-08-09 DIAGNOSIS — Z01818 Encounter for other preprocedural examination: Secondary | ICD-10-CM

## 2023-08-09 DIAGNOSIS — Z719 Counseling, unspecified: Secondary | ICD-10-CM

## 2023-08-09 HISTORY — PX: BREAST ENHANCEMENT SURGERY: SHX7

## 2023-08-09 HISTORY — DX: Gastro-esophageal reflux disease without esophagitis: K21.9

## 2023-08-09 HISTORY — PX: REVISION, RECONSTRUCTION, BREAST: SHX7641

## 2023-08-09 SURGERY — REVISION, RECONSTRUCTION, BREAST
Anesthesia: General | Laterality: Right

## 2023-08-09 MED ORDER — CEFAZOLIN SODIUM-DEXTROSE 2-4 GM/100ML-% IV SOLN
2.0000 g | INTRAVENOUS | Status: AC
Start: 2023-08-09 — End: 2023-08-09
  Administered 2023-08-09: 2 g via INTRAVENOUS

## 2023-08-09 MED ORDER — 0.9 % SODIUM CHLORIDE (POUR BTL) OPTIME
TOPICAL | Status: DC | PRN
  Administered 2023-08-09: 1000 mL

## 2023-08-09 MED ORDER — PROPOFOL 500 MG/50ML IV EMUL
INTRAVENOUS | Status: DC | PRN
  Administered 2023-08-09: 40 ug/kg/min via INTRAVENOUS

## 2023-08-09 MED ORDER — FENTANYL CITRATE (PF) 100 MCG/2ML IJ SOLN
INTRAMUSCULAR | Status: AC
  Filled 2023-08-09: qty 2

## 2023-08-09 MED ORDER — ROCURONIUM BROMIDE 100 MG/10ML IV SOLN
INTRAVENOUS | Status: DC | PRN
  Administered 2023-08-09: 40 mg via INTRAVENOUS

## 2023-08-09 MED ORDER — CHLORHEXIDINE GLUCONATE CLOTH 2 % EX PADS: 6.0000 | MEDICATED_PAD | Freq: Once | CUTANEOUS | Status: DC

## 2023-08-09 MED ORDER — FENTANYL CITRATE (PF) 100 MCG/2ML IJ SOLN: 25.0000 ug | INTRAMUSCULAR | Status: DC | PRN

## 2023-08-09 MED ORDER — LACTATED RINGERS IV SOLN: INTRAVENOUS | Status: DC

## 2023-08-09 MED ORDER — PHENYLEPHRINE HCL (PRESSORS) 10 MG/ML IV SOLN
INTRAVENOUS | Status: DC | PRN
  Administered 2023-08-09 (×3): 80 ug via INTRAVENOUS

## 2023-08-09 MED ORDER — SUGAMMADEX SODIUM 200 MG/2ML IV SOLN
INTRAVENOUS | Status: DC | PRN
  Administered 2023-08-09: 200 mg via INTRAVENOUS

## 2023-08-09 MED ORDER — BUPIVACAINE-EPINEPHRINE 0.25% -1:200000 IJ SOLN
INTRAMUSCULAR | Status: DC | PRN
  Administered 2023-08-09: 20 mL

## 2023-08-09 MED ORDER — CEFAZOLIN SODIUM-DEXTROSE 2-4 GM/100ML-% IV SOLN
INTRAVENOUS | Status: AC
Start: 2023-08-09 — End: 2023-08-09
  Filled 2023-08-09: qty 100

## 2023-08-09 MED ORDER — DEXMEDETOMIDINE HCL IN NACL 80 MCG/20ML IV SOLN
INTRAVENOUS | Status: DC | PRN
Start: 2023-08-09 — End: 2023-08-09
  Administered 2023-08-09: 4 ug via INTRAVENOUS

## 2023-08-09 MED ORDER — DEXAMETHASONE SODIUM PHOSPHATE 4 MG/ML IJ SOLN
INTRAMUSCULAR | Status: DC | PRN
Start: 2023-08-09 — End: 2023-08-09
  Administered 2023-08-09: 5 mg via INTRAVENOUS

## 2023-08-09 MED ORDER — PROPOFOL 10 MG/ML IV BOLUS
INTRAVENOUS | Status: DC | PRN
  Administered 2023-08-09: 150 mg via INTRAVENOUS
  Administered 2023-08-09: 50 mg via INTRAVENOUS

## 2023-08-09 MED ORDER — KETOROLAC TROMETHAMINE 30 MG/ML IJ SOLN
INTRAMUSCULAR | Status: AC
  Filled 2023-08-09: qty 1

## 2023-08-09 MED ORDER — ONDANSETRON HCL 4 MG/2ML IJ SOLN
INTRAMUSCULAR | Status: DC | PRN
Start: 2023-08-09 — End: 2023-08-09
  Administered 2023-08-09: 4 mg via INTRAVENOUS

## 2023-08-09 MED ORDER — ACETAMINOPHEN 500 MG PO TABS
1000.0000 mg | ORAL_TABLET | Freq: Once | ORAL | Status: AC
  Administered 2023-08-09: 1000 mg via ORAL

## 2023-08-09 MED ORDER — MIDAZOLAM HCL 2 MG/2ML IJ SOLN
INTRAMUSCULAR | Status: AC
Start: 2023-08-09 — End: 2023-08-09
  Filled 2023-08-09: qty 2

## 2023-08-09 MED ORDER — ACETAMINOPHEN 500 MG PO TABS
ORAL_TABLET | ORAL | Status: AC
  Filled 2023-08-09: qty 2

## 2023-08-09 MED ORDER — BUPIVACAINE-EPINEPHRINE (PF) 0.25% -1:200000 IJ SOLN
INTRAMUSCULAR | Status: AC
  Filled 2023-08-09: qty 30

## 2023-08-09 MED ORDER — AMISULPRIDE (ANTIEMETIC) 5 MG/2ML IV SOLN: 10.0000 mg | Freq: Once | INTRAVENOUS | Status: DC | PRN

## 2023-08-09 MED ORDER — FENTANYL CITRATE (PF) 100 MCG/2ML IJ SOLN
INTRAMUSCULAR | Status: DC | PRN
  Administered 2023-08-09 (×2): 50 ug via INTRAVENOUS

## 2023-08-09 MED ORDER — MIDAZOLAM HCL 2 MG/2ML IJ SOLN
INTRAMUSCULAR | Status: DC | PRN
  Administered 2023-08-09: 2 mg via INTRAVENOUS

## 2023-08-09 MED ORDER — KETOROLAC TROMETHAMINE 30 MG/ML IJ SOLN
30.0000 mg | Freq: Once | INTRAMUSCULAR | Status: AC | PRN
  Administered 2023-08-09: 30 mg via INTRAVENOUS

## 2023-08-09 MED ORDER — OXYCODONE HCL 5 MG PO TABS
ORAL_TABLET | ORAL | Status: AC
  Filled 2023-08-09: qty 1

## 2023-08-09 MED ORDER — OXYCODONE HCL 5 MG/5ML PO SOLN: 5.0000 mg | Freq: Once | ORAL | Status: AC | PRN

## 2023-08-09 MED ORDER — OXYCODONE HCL 5 MG PO TABS
5.0000 mg | ORAL_TABLET | Freq: Once | ORAL | Status: AC | PRN
  Administered 2023-08-09: 5 mg via ORAL

## 2023-08-09 SURGICAL SUPPLY — 56 items
BAG DECANTER FOR FLEXI CONT (MISCELLANEOUS) ×1 IMPLANT
BINDER BREAST LRG (GAUZE/BANDAGES/DRESSINGS) IMPLANT
BINDER BREAST MEDIUM (GAUZE/BANDAGES/DRESSINGS) IMPLANT
BINDER BREAST XLRG (GAUZE/BANDAGES/DRESSINGS) IMPLANT
BINDER BREAST XXLRG (GAUZE/BANDAGES/DRESSINGS) IMPLANT
BIOPATCH RED 1 DISK 7.0 (GAUZE/BANDAGES/DRESSINGS) IMPLANT
BLADE HEX COATED 2.75 (ELECTRODE) IMPLANT
BLADE SURG 15 STRL LF DISP TIS (BLADE) ×1 IMPLANT
BNDG GAUZE DERMACEA FLUFF 4 (GAUZE/BANDAGES/DRESSINGS) IMPLANT
CANISTER SUCT 1200ML W/VALVE (MISCELLANEOUS) ×1 IMPLANT
DERMABOND ADVANCED .7 DNX12 (GAUZE/BANDAGES/DRESSINGS) IMPLANT
DRAIN CHANNEL 19F RND (DRAIN) IMPLANT
ELECT BLADE 6.5 EXT (BLADE) IMPLANT
ELECTRODE BLDE 4.0 EZ CLN MEGD (MISCELLANEOUS) IMPLANT
ELECTRODE REM PT RTRN 9FT ADLT (ELECTROSURGICAL) ×1 IMPLANT
EVACUATOR SILICONE 100CC (DRAIN) IMPLANT
FUNNEL KELLER 2 DISP (MISCELLANEOUS) IMPLANT
GAUZE PAD ABD 8X10 STRL (GAUZE/BANDAGES/DRESSINGS) ×2 IMPLANT
GAUZE SPONGE 4X4 12PLY STRL LF (GAUZE/BANDAGES/DRESSINGS) IMPLANT
GLOVE BIO SURGEON STRL SZ 6.5 (GLOVE) IMPLANT
GLOVE BIO SURGEON STRL SZ8 (GLOVE) ×1 IMPLANT
GLOVE BIOGEL M STRL SZ7.5 (GLOVE) IMPLANT
GLOVE BIOGEL PI IND STRL 8 (GLOVE) ×1 IMPLANT
GOWN STRL REUS W/ TWL LRG LVL3 (GOWN DISPOSABLE) ×1 IMPLANT
GOWN STRL REUS W/ TWL XL LVL3 (GOWN DISPOSABLE) IMPLANT
GOWN STRL REUS W/TWL XL LVL3 (GOWN DISPOSABLE) ×1 IMPLANT
IV NS 500ML BAXH (IV SOLUTION) IMPLANT
KIT FILL ASEPTIC TRANSFER (MISCELLANEOUS) IMPLANT
NDL HYPO 25X1 1.5 SAFETY (NEEDLE) IMPLANT
NEEDLE HYPO 25X1 1.5 SAFETY (NEEDLE) IMPLANT
NS IRRIG 1000ML POUR BTL (IV SOLUTION) IMPLANT
PACK BASIN DAY SURGERY FS (CUSTOM PROCEDURE TRAY) ×1 IMPLANT
PACK UNIVERSAL I (CUSTOM PROCEDURE TRAY) ×1 IMPLANT
PENCIL SMOKE EVACUATOR (MISCELLANEOUS) ×1 IMPLANT
PIN SAFETY STERILE (MISCELLANEOUS) IMPLANT
SLEEVE SCD COMPRESS KNEE MED (STOCKING) ×1 IMPLANT
SPIKE FLUID TRANSFER (MISCELLANEOUS) IMPLANT
SPONGE T-LAP 18X18 ~~LOC~~+RFID (SPONGE) ×2 IMPLANT
STRIP SUTURE WOUND CLOSURE 1/2 (MISCELLANEOUS) IMPLANT
SUT MNCRL AB 3-0 PS2 27 (SUTURE) IMPLANT
SUT MNCRL AB 4-0 PS2 18 (SUTURE) IMPLANT
SUT MON AB 3-0 SH27 (SUTURE) ×1 IMPLANT
SUT MON AB 5-0 PS2 18 (SUTURE) ×1 IMPLANT
SUT PDS AB 2-0 CT2 27 (SUTURE) IMPLANT
SUT PDS II 3-0 CT2 27 ABS (SUTURE) IMPLANT
SUT SILK 2 0 PERMA HAND 18 BK (SUTURE) IMPLANT
SUT SILK 3 0 PS 1 (SUTURE) IMPLANT
SUT VIC AB 3-0 SH 27X BRD (SUTURE) IMPLANT
SUT VIC AB 4-0 PS2 18 (SUTURE) IMPLANT
SYR BULB IRRIG 60ML STRL (SYRINGE) ×1 IMPLANT
SYR CONTROL 10ML LL (SYRINGE) IMPLANT
TOWEL GREEN STERILE FF (TOWEL DISPOSABLE) ×2 IMPLANT
TRAY DSU PREP LF (CUSTOM PROCEDURE TRAY) ×1 IMPLANT
TUBE CONNECTING 20X1/4 (TUBING) ×1 IMPLANT
UNDERPAD 30X36 HEAVY ABSORB (UNDERPADS AND DIAPERS) ×2 IMPLANT
YANKAUER SUCT BULB TIP NO VENT (SUCTIONS) ×1 IMPLANT

## 2023-08-09 NOTE — Anesthesia Preprocedure Evaluation (Addendum)
 Anesthesia Evaluation  Patient identified by MRN, date of birth, ID band Patient awake    Reviewed: Allergy & Precautions, NPO status , Patient's Chart, lab work & pertinent test results  Airway Mallampati: II  TM Distance: >3 FB Neck ROM: Full    Dental  (+) Upper Dentures, Lower Dentures   Pulmonary asthma , former smoker   Pulmonary exam normal        Cardiovascular negative cardio ROS Normal cardiovascular exam     Neuro/Psych negative neurological ROS  negative psych ROS   GI/Hepatic Neg liver ROS,GERD  Medicated and Controlled,,  Endo/Other  negative endocrine ROS    Renal/GU negative Renal ROS     Musculoskeletal negative musculoskeletal ROS (+)    Abdominal  (+) + obese  Peds  Hematology negative hematology ROS (+)   Anesthesia Other Findings Macromastia   Reproductive/Obstetrics                             Anesthesia Physical Anesthesia Plan  ASA: 2  Anesthesia Plan: General   Post-op Pain Management:    Induction: Intravenous  PONV Risk Score and Plan: 3 and Ondansetron , Dexamethasone , Midazolam  and Treatment may vary due to age or medical condition  Airway Management Planned: Oral ETT  Additional Equipment:   Intra-op Plan:   Post-operative Plan: Extubation in OR  Informed Consent: I have reviewed the patients History and Physical, chart, labs and discussed the procedure including the risks, benefits and alternatives for the proposed anesthesia with the patient or authorized representative who has indicated his/her understanding and acceptance.     Dental advisory given  Plan Discussed with: CRNA  Anesthesia Plan Comments:        Anesthesia Quick Evaluation

## 2023-08-09 NOTE — Anesthesia Postprocedure Evaluation (Signed)
 Anesthesia Post Note  Patient: Jill Walsh  Procedure(s) Performed: REVISION, RECONSTRUCTION, BREAST (Right)     Patient location during evaluation: PACU Anesthesia Type: General Level of consciousness: awake Pain management: pain level controlled Vital Signs Assessment: post-procedure vital signs reviewed and stable Respiratory status: spontaneous breathing, nonlabored ventilation and respiratory function stable Cardiovascular status: blood pressure returned to baseline and stable Postop Assessment: no apparent nausea or vomiting Anesthetic complications: no   No notable events documented.  Last Vitals:  Vitals:   08/09/23 1415 08/09/23 1424  BP: 137/85 (!) 153/86  Pulse: 80 81  Resp: 18 15  Temp:  36.5 C  SpO2: 94% 94%    Last Pain:  Vitals:   08/09/23 1424  TempSrc:   PainSc: 3                  Gleb Mcguire P Norma Montemurro

## 2023-08-09 NOTE — Anesthesia Procedure Notes (Signed)
 Procedure Name: Intubation Date/Time: 08/09/2023 12:34 PM  Performed by: Donnell Berwyn SQUIBB, CRNAPre-anesthesia Checklist: Patient identified, Emergency Drugs available, Suction available, Patient being monitored and Timeout performed Patient Re-evaluated:Patient Re-evaluated prior to induction Oxygen  Delivery Method: Circle system utilized Preoxygenation: Pre-oxygenation with 100% oxygen  Induction Type: IV induction Ventilation: Mask ventilation without difficulty Laryngoscope Size: Mac and 3 Grade View: Grade I Tube type: Oral Tube size: 7.0 mm Placement Confirmation: ETT inserted through vocal cords under direct vision, positive ETCO2 and breath sounds checked- equal and bilateral Secured at: 22 cm Tube secured with: Tape Dental Injury: Teeth and Oropharynx as per pre-operative assessment

## 2023-08-09 NOTE — Discharge Instructions (Addendum)
 INSTRUCTIONS FOR AFTER BREAST SURGERY   You will likely have some questions about what to expect following your operation.  The following information will help you and your family understand what to expect when you are discharged from the hospital.  It is important to follow these guidelines to help ensure a smooth recovery and reduce complication.  Postoperative instructions include information on: diet, wound care, medications and physical activity.  AFTER SURGERY Expect to go home after the procedure.  In some cases, you may need to spend one night in the hospital for observation.  DIET Breast surgery does not require a specific diet.  However, the healthier you eat the better your body will heal. It is important to increasing your protein intake.  This means limiting the foods with sugar and carbohydrates.  Focus on vegetables and some meat.  If you have liposuction during your procedure be sure to drink water.  If your urine is bright yellow, then it is concentrated, and you need to drink more water.  As a general rule after surgery, you should have 8 ounces of water every hour while awake.  If you find you are persistently nauseated or unable to take in liquids let us  know.  NO TOBACCO USE or EXPOSURE.  This will slow your healing process and lead to a wound.  WOUND CARE Leave the binder on at all times except when showering . Use fragrance free soap like Dial, Dove or [place].   After 24 hours you can remove the binder to shower. Once dry apply binder or sports bra. No baths, pools or hot tubs for four weeks. We close your incision to leave the smallest and best-looking scar. No ointment or creams on your incisions for four weeks.  No Neosporin (Too many skin reactions).  A few weeks after surgery you can use Mederma and start massaging the scar. We ask you to wear your binder or sports bra for the first 6 weeks around the clock, including while sleeping. This provides added comfort and helps  reduce the fluid accumulation at the surgery site. NO Ice or heating pads to the operative site.  You have a very high risk of a BURN before you feel the temperature change.  ACTIVITY No heavy lifting until cleared by the doctor.  This usually means no more than a half-gallon of milk.  It is OK to walk and climb stairs. Moving your legs is very important to decrease your risk of a blood clot.  It will also help keep you from getting deconditioned.  Every 1 to 2 hours get up and walk for 5 minutes. This will help with a quicker recovery back to normal.  Let pain be your guide so you don't do too much.  This time is for you to recover.  You will be more comfortable if you sleep and rest with your head elevated either with a few pillows under you or in a recliner.  No stomach sleeping for a three months.  WORK Everyone returns to work at different times. As a rough guide, most people take at least 1 - 2 weeks off prior to returning to work. If you need documentation for your job, give the forms to the front staff at the clinic.  DRIVING Arrange for someone to bring you home from the hospital after your surgery.  You may be able to drive a few days after surgery but not while taking any narcotics or valium .  BOWEL MOVEMENTS Constipation can occur  after anesthesia and while taking pain medication.  It is important to stay ahead for your comfort.  We recommend taking Milk of Magnesia (2 tablespoons; twice a day) while taking the pain pills.  MEDICATIONS You may be prescribed should start after surgery At your preoperative visit for you history and physical you may have been given the following medications: Zofran  4 mg:  This is to treat nausea and vomiting.  You can take this every 6 hours as needed and only if needed. Oxycodone  5 mg:  This is only to be used after you have taken the Motrin  or the Tylenol . Every 8 hours as needed.   Over the counter Medication to take: Ibuprofen  (Motrin ) 600 mg:   Take this every 6 hours.  If you have additional pain then take 500 mg of the Tylenol  every 8 hours.  Only take the Norco after you have tried these two. MiraLAX or Milk of Magnesia: Take this according to the bottle if you take the Norco.  WHEN TO CALL Call your surgeon's office if any of the following occur: Fever 101 degrees F or greater Excessive bleeding or fluid from the incision site. Pain that increases over time without aid from the medications Redness, warmth, or pus draining from incision sites Persistent nausea or inability to take in liquids Severe misshapen area that underwent the operation.  Here are some resources for breast cancer patients:  Plastic surgery website: https://www.plasticsurgery.org/for-medical-professionals/education-and-resources/publications/breast-reconstruction-magazine Breast Reconstruction Awareness Campaign:  ChessContest.fr Plastic surgery Implant information:  https://www.plasticsurgery.org/patient-safety/breast-implant-safety  Post Anesthesia Home Care Instructions  Activity: Get plenty of rest for the remainder of the day. A responsible individual must stay with you for 24 hours following the procedure.  For the next 24 hours, DO NOT: -Drive a car -Advertising copywriter -Drink alcoholic beverages -Take any medication unless instructed by your physician -Make any legal decisions or sign important papers.  Meals: Start with liquid foods such as gelatin or soup. Progress to regular foods as tolerated. Avoid greasy, spicy, heavy foods. If nausea and/or vomiting occur, drink only clear liquids until the nausea and/or vomiting subsides. Call your physician if vomiting continues.  Special Instructions/Symptoms: Your throat may feel dry or sore from the anesthesia or the breathing tube placed in your throat during surgery. If this causes discomfort, gargle with warm salt water. The discomfort should disappear within 24 hours.  If you had  a scopolamine  patch placed behind your ear for the management of post- operative nausea and/or vomiting:  1. The medication in the patch is effective for 72 hours, after which it should be removed.  Wrap patch in a tissue and discard in the trash. Wash hands thoroughly with soap and water. 2. You may remove the patch earlier than 72 hours if you experience unpleasant side effects which may include dry mouth, dizziness or visual disturbances. 3. Avoid touching the patch. Wash your hands with soap and water after contact with the patch.     Next dose of tylenol  may be taken at 4p

## 2023-08-09 NOTE — Op Note (Signed)
 DATE OF OPERATION: 08/09/2023  LOCATION: [facility] operating Room  PREOPERATIVE DIAGNOSIS: Breast reconstruction, asymmetry  POSTOPERATIVE DIAGNOSIS: Same  PROCEDURE: Revision of scar, right breast  SURGEON: Marinell Birmingham, MD  ASSISTANT: Estefana Peck  EBL: 5 cc  CONDITION: Stable  COMPLICATIONS: None  INDICATION: The patient, Jill Walsh, is a 53 y.o. female born on [birth year], is here for revision of the right reconstructed breast.   PROCEDURE DETAILS:  The patient was seen prior to surgery and marked.  The IV antibiotics were given. The patient was taken to the operating room and given a general anesthetic. A standard time out was performed and all information was confirmed by those in the room. SCDs were placed.   The chest was prepped and draped in usual sterile manner.  The previous scar was excised along with a portion of skin around the scar.  The tissues were de-epithelialized to leave as much tissue as possible.  Excess tissue at the medial and lateral aspects of the scar were excised sharply.  The wound was then closed with interrupted 3-0 Monocryl in the dermis and a running 4-0 Monocryl subcuticular stitch.  The entire length of the scar was 25 cm.  There were no complications and all instrument needle and sponge counts were reported as correct.  Patient was placed in a supportive compressive garment and awakened from anesthesia without incident.  She was transferred to the recovery room in good condition. The patient was allowed to wake up and taken to recovery room in stable condition at the end of the case. The family was notified at the end of the case.   The advanced practice practitioner (APP) assisted throughout the case.  The APP was essential in retraction and counter traction when needed to make the case progress smoothly.  This retraction and assistance made it possible to see the tissue plans for the procedure.  The assistance was needed for blood control,  tissue re-approximation and assisted with closure of the incision site.

## 2023-08-09 NOTE — Interval H&P Note (Signed)
 History and Physical Interval Note: No change in exam or indication for surgery. Discussed the procedure with her, will limit revision to the right breast which was marked. All questions answered. Will proceed at her request  08/09/2023 11:45 AM  Jill Walsh  has presented today for surgery, with the diagnosis of [id number].  The various methods of treatment have been discussed with the patient and family. After consideration of risks, benefits and other options for treatment, the patient has consented to  Procedure(s) with comments: REVISION, RECONSTRUCTION, BREAST (Bilateral) - bilateral breast reconstruction revision as a surgical intervention.  The patient's history has been reviewed, patient examined, no change in status, stable for surgery.  I have reviewed the patient's chart and labs.  Questions were answered to the patient's satisfaction.     Leonce KATHEE Birmingham

## 2023-08-09 NOTE — Transfer of Care (Signed)
 Immediate Anesthesia Transfer of Care Note  Patient: Jill Walsh  Procedure(s) Performed: REVISION, RECONSTRUCTION, BREAST (Right)  Patient Location: PACU  Anesthesia Type:General  Level of Consciousness: awake, alert , oriented, and patient cooperative  Airway & Oxygen  Therapy: Patient Spontanous Breathing and Patient connected to nasal cannula oxygen   Post-op Assessment: Report given to RN and Post -op Vital signs reviewed and stable  Post vital signs: Reviewed and stable  Last Vitals:  Vitals Value Taken Time  BP    Temp    Pulse    Resp    SpO2      Last Pain:  Vitals:   08/09/23 0948  TempSrc: Temporal  PainSc: 0-No pain      Patients Stated Pain Goal: 3 (08/09/23 0948)  Complications: No notable events documented.

## 2023-08-10 ENCOUNTER — Encounter (HOSPITAL_BASED_OUTPATIENT_CLINIC_OR_DEPARTMENT_OTHER): Payer: Self-pay | Admitting: Plastic Surgery

## 2023-08-15 ENCOUNTER — Encounter: Admitting: Plastic Surgery

## 2023-08-15 NOTE — Progress Notes (Unsigned)
 53 year old female here for follow-up after revision of right breast scar with Dr. Waddell on 08/09/2023 in regards to her breast reconstruction.  She is here with her daughter-in-law.  She reports she is having some discomfort to the right breast, reports oxycodone  has been helpful, but has noticed some itching of her arms when taking the oxycodone .  She tried it with Benadryl  and reports that she no longer had any itching.  She denied any tongue swelling, throat swelling or phonation changes.  She does not tolerate tramadol .  She reports her job has not received her FMLA paperwork yet.  Chaperone present on exam On exam right breast incision is intact, appears to be healing well.  There is no erythema or cellulitic changes.  No subcutaneous fluid collection noted.  Left breast without any changes.  A/P:  Patient appears to be doing well, recommend continuing with Benadryl  and oxycodone .  Offered alternatives, but patient feels as if the oxycodone  has been helpful when she started using Benadryl .  Discussed with patient that her FMLA/short-term disability paperwork was filled out and faxed.  Recommend following up next week for reevaluation.  Call with questions or concerns.

## 2023-08-16 ENCOUNTER — Ambulatory Visit (INDEPENDENT_AMBULATORY_CARE_PROVIDER_SITE_OTHER): Admitting: Surgical

## 2023-08-16 DIAGNOSIS — Z9889 Other specified postprocedural states: Secondary | ICD-10-CM

## 2023-08-16 DIAGNOSIS — C50011 Malignant neoplasm of nipple and areola, right female breast: Secondary | ICD-10-CM

## 2023-08-29 ENCOUNTER — Encounter: Admitting: Surgical

## 2023-08-31 ENCOUNTER — Encounter: Admitting: Plastic Surgery

## 2023-08-31 ENCOUNTER — Ambulatory Visit: Admitting: Plastic Surgery

## 2023-08-31 VITALS — BP 153/90 | HR 98

## 2023-08-31 DIAGNOSIS — Z9889 Other specified postprocedural states: Secondary | ICD-10-CM

## 2023-08-31 NOTE — Progress Notes (Signed)
 Jill Walsh returns today approximately 3 weeks postop from revision of her breast reconstruction.  She is overall doing well.  She has some discomfort along the right lateral border of the breast.  Otherwise no complaints.  On evaluation she has nice shape and symmetry.  The incisions are healing well.  Continue scar massage.  May increase activity as tolerated but refrain from submerging the incisions in water for another 2 to 3 weeks.  Keep scheduled follow-up appointment next week.  Consider nipple tattoos  approximately 6 months after surgery.

## 2023-09-13 ENCOUNTER — Encounter: Admitting: Surgical

## 2023-09-14 ENCOUNTER — Telehealth: Payer: Self-pay | Admitting: Surgical

## 2023-09-14 ENCOUNTER — Encounter: Admitting: Surgical

## 2023-09-14 NOTE — Telephone Encounter (Signed)
 pt was made aware of no show fee cancel same day, she is unable to come today and wants to r/s  Pt called at 12:30pm same day to cancel apt

## 2023-09-19 ENCOUNTER — Ambulatory Visit (INDEPENDENT_AMBULATORY_CARE_PROVIDER_SITE_OTHER): Admitting: Surgical

## 2023-09-19 VITALS — BP 149/91 | HR 98 | Ht 65.0 in | Wt 190.0 lb

## 2023-09-19 DIAGNOSIS — C50011 Malignant neoplasm of nipple and areola, right female breast: Secondary | ICD-10-CM

## 2023-09-19 DIAGNOSIS — C50012 Malignant neoplasm of nipple and areola, left female breast: Secondary | ICD-10-CM

## 2023-09-19 DIAGNOSIS — Z9889 Other specified postprocedural states: Secondary | ICD-10-CM

## 2023-09-19 NOTE — Progress Notes (Signed)
 53 year old female here for follow-up on her right breast reconstruction revision.  She is here with her husband.  She reports she is overall doing well.  She is not having any infectious symptoms.  Right breast incision is healing well without any issues.  She is interested in nipple areola tattoo and is aware that she can schedule this 6 months from her last operation which would be around March 2026.  Chaperone present on exam On exam right breast incision is intact and appears to be healing well.  There is no erythema or cellulitic changes noted.  She does have 2 Monocryl suture knots noted along the medial and lateral aspect.  No subcutaneous fluid collection noted palpation.  A/P:  Patient is doing well, no signs infection or concern on exam.  Recommend continuing with compressive garments 24/7 for total of 6 weeks, increase activity as tolerated.  Recommend following up in about 4 months for reevaluation, however discussed calling with questions or concerns or following up for reevaluation if she has any changes or concerns prior to then.

## 2023-10-05 ENCOUNTER — Telehealth: Payer: Self-pay

## 2023-10-05 ENCOUNTER — Other Ambulatory Visit: Payer: Self-pay

## 2023-10-05 DIAGNOSIS — Z800 Family history of malignant neoplasm of digestive organs: Secondary | ICD-10-CM

## 2023-10-05 MED ORDER — NA SULFATE-K SULFATE-MG SULF 17.5-3.13-1.6 GM/177ML PO SOLN: 1.0000 | Freq: Once | ORAL | 0 refills | Status: AC

## 2023-10-05 NOTE — Telephone Encounter (Signed)
 Gastroenterology Pre-Procedure Review  Request Date: 01/17/24 Requesting Physician: Dr. Jinny  PATIENT REVIEW QUESTIONS: The patient responded to the following health history questions as indicated:    1. Are you having any GI issues? no 2. Do you have a personal history of Polyps? Due to family history Dr. Therisa recommended repeat in 5 years 3. Do you have a family history of Colon Cancer or Polyps? yes (2) Maternal aunts had colon cancer, pt has history of breast cancer) 4. Diabetes Mellitus? no 5. Joint replacements in the past 12 months?no 6. Major health problems in the past 3 months?no 7. Any artificial heart valves, MVP, or defibrillator?no    MEDICATIONS & ALLERGIES:    Patient reports the following regarding taking any anticoagulation/antiplatelet therapy:   Plavix, Coumadin, Eliquis, Xarelto, Lovenox , Pradaxa, Brilinta, or Effient? no Aspirin? no  Patient confirms/reports the following medications:  Current Outpatient Medications  Medication Sig Dispense Refill   Na Sulfate-K Sulfate-Mg Sulfate concentrate (SUPREP) 17.5-3.13-1.6 GM/177ML SOLN Take 1 kit (354 mLs total) by mouth once for 1 dose. 354 mL 0   Acetaminophen  (TYLENOL  8 HOUR PO) Take by mouth.     albuterol  (VENTOLIN  HFA) 108 (90 Base) MCG/ACT inhaler Inhale into the lungs every 6 (six) hours as needed for wheezing or shortness of breath.     famotidine  (PEPCID ) 20 MG tablet Take 1 tablet (20 mg total) by mouth 2 (two) times daily. 60 tablet 1   ondansetron  (ZOFRAN -ODT) 8 MG disintegrating tablet Take 1 tablet (8 mg total) by mouth every 8 (eight) hours as needed for nausea or vomiting. 20 tablet 0   No current facility-administered medications for this visit.    Patient confirms/reports the following allergies:  No Known Allergies  Orders Placed This Encounter  Procedures   Ambulatory referral to Gastroenterology    Referral Priority:   Routine    Referral Type:   Consultation    Referral Reason:   Specialty  Services Required    Referred to Provider:   Jinny Carmine, MD    Number of Visits Requested:   1    AUTHORIZATION INFORMATION Primary Insurance: 1D#: Group #:  Secondary Insurance: 1D#: Group #:  SCHEDULE INFORMATION: Date: 01/17/24 Time: Location: [facility]

## 2023-10-16 ENCOUNTER — Encounter: Payer: Self-pay | Admitting: Emergency Medicine

## 2023-10-16 ENCOUNTER — Ambulatory Visit
Admission: EM | Admit: 2023-10-16 | Discharge: 2023-10-16 | Disposition: A | Discharge status: Home / Self Care | Attending: Family Medicine | Admitting: Family Medicine

## 2023-10-16 ENCOUNTER — Ambulatory Visit (INDEPENDENT_AMBULATORY_CARE_PROVIDER_SITE_OTHER)

## 2023-10-16 DIAGNOSIS — M25562 Pain in left knee: Secondary | ICD-10-CM

## 2023-10-16 DIAGNOSIS — M25469 Effusion, unspecified knee: Secondary | ICD-10-CM | POA: Diagnosis not present

## 2023-10-16 MED ORDER — CYCLOBENZAPRINE HCL 5 MG PO TABS: 5.0000 mg | ORAL_TABLET | Freq: Three times a day (TID) | ORAL | 0 refills | Status: AC | PRN

## 2023-10-16 MED ORDER — IBUPROFEN 800 MG PO TABS
800.0000 mg | ORAL_TABLET | Freq: Once | ORAL | Status: AC
  Administered 2023-10-16: 800 mg via ORAL

## 2023-10-16 MED ORDER — ACETAMINOPHEN 325 MG PO TABS
975.0000 mg | ORAL_TABLET | Freq: Once | ORAL | Status: AC
  Administered 2023-10-16: 975 mg via ORAL

## 2023-10-16 MED ORDER — HYDROCODONE-ACETAMINOPHEN 5-325 MG PO TABS
1.0000 | ORAL_TABLET | Freq: Four times a day (QID) | ORAL | 0 refills | Status: AC | PRN
Start: 2023-10-16 — End: ?

## 2023-10-16 MED ORDER — NAPROXEN 500 MG PO TABS: 500.0000 mg | ORAL_TABLET | Freq: Two times a day (BID) | ORAL | 0 refills | Status: AC

## 2023-10-16 NOTE — ED Provider Notes (Signed)
 [facility]    CSN: [id number] Arrival date & time: 10/16/23  1134      History   Chief Complaint Chief Complaint  Patient presents with   Knee Pain    left   Fall    HPI  HPI Jill Walsh is a 53 y.o. female.   Jill Walsh presents for left knee pain. Two weeks ago she was trying to get out of the tub and fell and slipped on the towel.  She landed on her left knee. She didn't hit her head or pass out due to pain.  She has a knee brace, Tylenol , ibuprofen  800 mg and icing the knee without relief. Going up and down the steps at work increases her bad.  She was walking yesterday and her leg gave out.  These actions make her pain worse.  Pain described as tightness and sharp. Pain doesn't radiate.    No history of knee pain.  She took Tylneol for pain around 7 AM.       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   Family history of colon cancer 04/23/2019   Simple chronic bronchitis (HCC) 05/30/2017   Breast pain 12/20/2014   Family history of breast cancer 05/28/2014   Current smoker 05/28/2014   Breast cancer (HCC) 05/28/2014   Breast cancer, right breast (HCC) 03/28/2014    Past Surgical History:  Procedure Laterality Date   BREAST BIOPSY Left    02/28/14 negative   BREAST BIOPSY Right     02/2014 malignant   BREAST ENHANCEMENT SURGERY  08/2023   both mastectomy   BREAST RECONSTRUCTION WITH PLACEMENT OF TISSUE EXPANDER AND FLEX HD (ACELLULAR HYDRATED DERMIS) Bilateral 06/29/2022   Procedure: BREAST RECONSTRUCTION WITH PLACEMENT OF TISSUE EXPANDER AND FLEX HD (ACELLULAR HYDRATED DERMIS);  Surgeon: Waddell Leonce NOVAK, MD;  Location: [facility];  Service: Plastics;  Laterality: Bilateral;   COLONOSCOPY WITH PROPOFOL  N/A  11/13/2018   Procedure: COLONOSCOPY WITH PROPOFOL ;  Surgeon: Therisa Bi, MD;  Location: [facility];  Service: Gastroenterology;  Laterality: N/A;   MASTECTOMY Right 2016   BREAST CA   REMOVAL OF TISSUE EXPANDER AND PLACEMENT OF IMPLANT Bilateral 02/07/2023   Procedure: bilateral removal of tissue expanders and placement of implants;  Surgeon: Waddell Leonce NOVAK, MD;  Location: [facility];  Service: Plastics;  Laterality: Bilateral;   REVISION, RECONSTRUCTION, BREAST Right 08/09/2023   Procedure: REVISION, RECONSTRUCTION, BREAST;  Surgeon: Waddell Leonce NOVAK, MD;  Location: [facility];  Service: Plastics;  Laterality: Right;  Right breast reconstruction revision   TOTAL MASTECTOMY Left 05/29/2020   Procedure: TOTAL MASTECTOMY;  Surgeon: Tye Millet, DO;  Location: [facility];  Service: General;  Laterality: Left;   TUBAL LIGATION      OB History   No obstetric history on file.      Home Medications    Prior to Admission medications   Medication Sig Start Date End Date Taking? Authorizing Provider  Acetaminophen  (TYLENOL  8 HOUR PO) Take by mouth.   Yes [provider]  cyclobenzaprine  (FLEXERIL ) 5 MG tablet Take 1 tablet (5 mg total) by mouth 3 (three) times daily as needed. 10/16/23  Yes Aashrith Eves, DO  famotidine  (PEPCID ) 20 MG tablet  Take 1 tablet (20 mg total) by mouth 2 (two) times daily. 06/12/23  Yes Bernardino Ditch, NP  HYDROcodone -acetaminophen  (NORCO/VICODIN) 5-325 MG tablet Take 1 tablet by mouth every 6 (six) hours as needed. 10/16/23  Yes Richy Spradley, DO  naproxen  (NAPROSYN ) 500 MG tablet Take 1 tablet (500 mg total) by mouth 2 (two) times daily with a meal. 10/16/23  Yes Adolph Clutter, DO  albuterol  (VENTOLIN  HFA) 108 (90 Base) MCG/ACT inhaler Inhale into the lungs every 6 (six) hours as needed for wheezing or shortness of breath.    [provider]  ipratropium-albuterol  (DUONEB) 0.5-2.5 (3) MG/3ML SOLN Take 3 mLs by  nebulization every 6 (six) hours as needed. 03/23/18 12/27/18  Joshua Cathryne BROCKS, MD    Family History Family History  Problem Relation Age of Onset   Cancer Mother    Breast cancer Mother 41   Stroke Brother    Cancer Maternal Aunt     Social History Social History   Tobacco Use   Smoking status: Former    Current packs/day: 0.00    Types: Cigarettes, E-cigarettes    Start date: 05/23/1999    Quit date: 05/23/2014    Years since quitting: 9.4   Smokeless tobacco: Former  Building services engineer status: Former  Substance Use Topics   Alcohol use: No   Drug use: No     Allergies   Patient has no known allergies.   Review of Systems Review of Systems: :negative unless otherwise stated in HPI.      Physical Exam Triage Vital Signs ED Triage Vitals  Encounter Vitals Group     BP 10/16/23 1202 (!) 164/94     Girls Systolic BP Percentile --      Girls Diastolic BP Percentile --      Boys Systolic BP Percentile --      Boys Diastolic BP Percentile --      Pulse Rate 10/16/23 1202 (!) 102     Resp 10/16/23 1202 18     Temp 10/16/23 1202 98.2 F (36.8 C)     Temp Source 10/16/23 1202 Oral     SpO2 10/16/23 1202 97 %     Weight 10/16/23 1200 190 lb 0.6 oz (86.2 kg)     Height 10/16/23 1200 5' 5 (1.651 m)     Head Circumference --      Peak Flow --      Pain Score 10/16/23 1200 9     Pain Loc --      Pain Education --      Exclude from Growth Chart --    No data found.  Updated Vital Signs BP (!) 164/94 (BP Location: Left Arm)   Pulse (!) 102   Temp 98.2 F (36.8 C) (Oral)   Resp 18   Ht 5' 5 (1.651 m)   Wt 86.2 kg   LMP 04/22/2019   SpO2 97%   BMI 31.62 kg/m   Visual Acuity Right Eye Distance:   Left Eye Distance:   Bilateral Distance:    Right Eye Near:   Left Eye Near:    Bilateral Near:     Physical Exam GEN: uncomfortable appearing female in no acute distress  CVS: well perfused  RESP: speaking in full sentences without pause, no  respiratory distress  MSK:   Left Knee Exam -Inspection: no deformity, no discoloration -Palpation: +medial and lateral joint line tenderness, mild tibial tuberosity tenderness, + joint infusion, + anterior edema  -ROM: active  knee range of motion limited due to pain, passive ROM is full but very painful for patient  Strength testing was limited due to pain -Limb neurovascularly intact, no instability noted    UC Treatments / Results  Labs (all labs ordered are listed, but only abnormal results are displayed) Labs Reviewed - No data to display  EKG   Radiology DG Knee Complete 4 Views Left Result Date: 10/16/2023 CLINICAL DATA:  Left knee pain and swelling for 2 weeks after fall. EXAM: LEFT KNEE - COMPLETE 4+ VIEW COMPARISON:  December 21, 2017. FINDINGS: No definite fracture or dislocation is noted. Small suprapatellar joint effusion is noted. Moderate narrowing of medial joint space is noted. Minimal osteophyte formation is noted laterally. IMPRESSION: Small suprapatellar joint effusion. Moderate degenerative joint disease is noted medially. No fracture or dislocation. Electronically Signed   By: Lynwood Landy Raddle M.D.   On: 10/16/2023 13:12     Procedures Procedures (including critical care time)  Medications Ordered in UC Medications  ibuprofen  (ADVIL ) tablet 800 mg (800 mg Oral Given 10/16/23 1238)  acetaminophen  (TYLENOL ) tablet 975 mg (975 mg Oral Given 10/16/23 1242)    Initial Impression / Assessment and Plan / UC Course  I have reviewed the triage vital signs and the nursing notes.  Pertinent labs & imaging results that were available during my care of the patient were reviewed by me and considered in my medical decision making (see chart for details).      Pt is a 53 y.o.  female with 2 weeks of left knee pain after a fall in the bathroom at her home. IM Toradol  declined.  Pt is quite a bit of pain. Tylenol  and ibuprofen  given for pain control. On exam, pt has diffuse  tenderness and anterior knee edema concerning for fracture.   Obtained left knee plain films.  Personally interpreted by me were unremarkable for fracture or dislocation. Radiologist report reviewed and additionally notes suprapatellar joint effusion and degenerative changes.    Patient to gradually return to normal activities, as tolerated and continue ordinary activities within the limits permitted by pain. Prescribed Naproxen  sodium, short course Norco and muscle relaxer  for pain relief.  Tylenol  (no more than 2000 mg additional ) PRN. Advised patient to avoid OTC NSAIDs while taking prescription NSAID.  Patient to follow up with orthopedic provider for further evaluation. Return and ED precautions given. Understanding voiced. Discussed MDM, treatment plan and plan for follow-up with patient who agrees with plan.   Final Clinical Impressions(s) / UC Diagnoses   Final diagnoses:  Acute pain of left knee  Suprapatellar effusion of knee     Discharge Instructions      Your knee xray showed some age related changes and fluid within your knee joint.  Stop by the pharmacy to pick up your prescriptions.     You have a condition requiring you to follow up with an orthopedic specialist. Call one of the following offices for appointment:   [facility] Address: [address] [place], [place] [address] Phone: [phone]  [facility] [address] [place], [place], [place] [address] Phone: [phone]  [facility] [address] [place],  [place]  [address] Phone: [phone]  [facility] [address], [place], [place] [address] Phone: [phone]      ED Prescriptions     Medication Sig Dispense Auth. Provider   naproxen  (NAPROSYN ) 500 MG tablet Take 1 tablet (500 mg total) by mouth  2 (two) times daily with a meal. 30 tablet Denicia Pagliarulo, DO   cyclobenzaprine  (FLEXERIL ) 5 MG tablet Take 1 tablet (5 mg total) by mouth 3 (three) times daily as  needed. 30 tablet Casimira Sutphin, DO   HYDROcodone -acetaminophen  (NORCO/VICODIN) 5-325 MG tablet Take 1 tablet by mouth every 6 (six) hours as needed. 12 tablet Nema Oatley, DO      I have reviewed the PDMP during this encounter.   Maybelle Depaoli, DO 10/16/23 1347

## 2023-10-16 NOTE — ED Triage Notes (Signed)
 Pt c/o left knee pain and swelling. Started about 2 weeks ago. She states she fell at home on tile. She states she has recently had breast augmentation and fell with all her weight on her left knee. Pt states yesterday at a company picnic she was walking and her knee gave out and almost fell. Pt is tearful once she got to the exam room from the pain.

## 2023-10-16 NOTE — Discharge Instructions (Addendum)
 Your knee xray showed some age related changes and fluid within your knee joint.  Stop by the pharmacy to pick up your prescriptions.     You have a condition requiring you to follow up with an orthopedic specialist. Call one of the following offices for appointment:   [facility] Address: [address] [place], [place] [address] Phone: [phone]  [facility] [address] [place], [place], [place] [address] Phone: [phone]  [facility] [address] [place],  [place]  [address] Phone: [phone]  [facility] [address], [place], [place] [address] Phone: [phone]

## 2023-12-16 ENCOUNTER — Telehealth: Payer: Self-pay | Admitting: Physician Assistant

## 2023-12-16 NOTE — Telephone Encounter (Signed)
 NEED TO BE A F/U NOT A POST OP F/U for previous surgery and to discuss Nipple tattooing //postop:bilateral breast reconstruction revision/ins/sx.08-09-23...//11:15am

## 2024-01-17 ENCOUNTER — Encounter: Admission: RE | Disposition: A | Payer: Self-pay | Source: Home / Self Care | Attending: Gastroenterology

## 2024-01-17 ENCOUNTER — Ambulatory Visit: Admitting: Certified Registered"

## 2024-01-17 ENCOUNTER — Ambulatory Visit
Admission: RE | Admit: 2024-01-17 | Discharge: 2024-01-17 | Disposition: A | Discharge status: Home / Self Care | Attending: Gastroenterology | Admitting: Gastroenterology

## 2024-01-17 ENCOUNTER — Encounter: Payer: Self-pay | Admitting: Gastroenterology

## 2024-01-17 DIAGNOSIS — Z1211 Encounter for screening for malignant neoplasm of colon: Secondary | ICD-10-CM

## 2024-01-17 DIAGNOSIS — Z800 Family history of malignant neoplasm of digestive organs: Secondary | ICD-10-CM | POA: Diagnosis not present

## 2024-01-17 HISTORY — PX: COLONOSCOPY: SHX5424

## 2024-01-17 SURGERY — COLONOSCOPY
Anesthesia: General

## 2024-01-17 MED ORDER — SODIUM CHLORIDE 0.9 % IV SOLN: INTRAVENOUS | Status: DC

## 2024-01-17 MED ORDER — PROPOFOL 500 MG/50ML IV EMUL
INTRAVENOUS | Status: DC | PRN
  Administered 2024-01-17: 150 ug/kg/min via INTRAVENOUS
  Administered 2024-01-17: 50 mg via INTRAVENOUS

## 2024-01-17 MED ORDER — LIDOCAINE HCL (CARDIAC) PF 100 MG/5ML IV SOSY
PREFILLED_SYRINGE | INTRAVENOUS | Status: DC | PRN
  Administered 2024-01-17: 100 mg via INTRAVENOUS

## 2024-01-17 NOTE — H&P (Signed)
 Rogelia Copping, MD [facility] [place] [facility] [address] [place], [place] [address] Phone: [phone] Fax : [phone]  Primary Care Physician:  Patient, No Pcp Per Primary Gastroenterologist:  Dr. Copping  Pre-Procedure History & Physical: HPI:  Jill Walsh is a 53 y.o. female is here for a screening colonoscopy.   Past Medical History:  Diagnosis Date   Asthma    Breast cancer (HCC) 2016   RT MASTECTOMY   Breast cancer (HCC) 2022   Left breast   GERD (gastroesophageal reflux disease)     Past Surgical History:  Procedure Laterality Date   BREAST BIOPSY Left    02/28/14 negative   BREAST BIOPSY Right     02/2014 malignant   BREAST ENHANCEMENT SURGERY  08/2023   both mastectomy   BREAST RECONSTRUCTION WITH PLACEMENT OF TISSUE EXPANDER AND FLEX HD (ACELLULAR HYDRATED DERMIS) Bilateral 06/29/2022   Procedure: BREAST RECONSTRUCTION WITH PLACEMENT OF TISSUE EXPANDER AND FLEX HD (ACELLULAR HYDRATED DERMIS);  Surgeon: Waddell Leonce NOVAK, MD;  Location: [facility];  Service: Plastics;  Laterality: Bilateral;   COLONOSCOPY WITH PROPOFOL  N/A 11/13/2018   Procedure: COLONOSCOPY WITH PROPOFOL ;  Surgeon: Therisa Bi, MD;  Location: [facility];  Service: Gastroenterology;  Laterality: N/A;   MASTECTOMY Right 2016   BREAST CA   REMOVAL OF TISSUE EXPANDER AND PLACEMENT OF IMPLANT Bilateral 02/07/2023   Procedure: bilateral removal of tissue expanders and placement of implants;  Surgeon: Waddell Leonce NOVAK, MD;  Location: [facility];  Service: Plastics;  Laterality: Bilateral;   REVISION, RECONSTRUCTION, BREAST Right 08/09/2023   Procedure: REVISION, RECONSTRUCTION, BREAST;  Surgeon: Waddell Leonce NOVAK, MD;  Location: [facility];  Service: Plastics;  Laterality: Right;  Right breast reconstruction revision   TOTAL MASTECTOMY Left 05/29/2020   Procedure: TOTAL MASTECTOMY;  Surgeon: Tye Millet, DO;  Location: [facility];  Service: General;  Laterality:  Left;   TUBAL LIGATION      Prior to Admission medications   Medication Sig Start Date End Date Taking? Authorizing Provider  Acetaminophen  (TYLENOL  8 HOUR PO) Take by mouth.    [provider]  albuterol  (VENTOLIN  HFA) 108 (90 Base) MCG/ACT inhaler Inhale into the lungs every 6 (six) hours as needed for wheezing or shortness of breath.    [provider]  cyclobenzaprine  (FLEXERIL ) 5 MG tablet Take 1 tablet (5 mg total) by mouth 3 (three) times daily as needed. 10/16/23   Brimage, Vondra, DO  famotidine  (PEPCID ) 20 MG tablet Take 1 tablet (20 mg total) by mouth 2 (two) times daily. 06/12/23   Bernardino Ditch, NP  HYDROcodone -acetaminophen  (NORCO/VICODIN) 5-325 MG tablet Take 1 tablet by mouth every 6 (six) hours as needed. 10/16/23   Brimage, Vondra, DO  naproxen  (NAPROSYN ) 500 MG tablet Take 1 tablet (500 mg total) by mouth 2 (two) times daily with a meal. 10/16/23   Brimage, Vondra, DO  ipratropium-albuterol  (DUONEB) 0.5-2.5 (3) MG/3ML SOLN Take 3 mLs by nebulization every 6 (six) hours as needed. 03/23/18 12/27/18  Joshua Cathryne BROCKS, MD    Allergies as of 10/05/2023   (No Known Allergies)    Family History  Problem Relation Age of Onset   Cancer Mother    Breast cancer Mother 18   Stroke Brother    Cancer Maternal Aunt     Social History   Socioeconomic History   Marital status: Single    Spouse name: Not on file   Number of children: Not on file   Years of  education: Not on file   Highest education level: Not on file  Occupational History   Not on file  Tobacco Use   Smoking status: Former    Current packs/day: 0.00    Types: Cigarettes, E-cigarettes    Start date: 05/23/1999    Quit date: 05/23/2014    Years since quitting: 9.6   Smokeless tobacco: Former  Building Services Engineer status: Former  Substance and Sexual Activity   Alcohol use: No   Drug use: No   Sexual activity: Yes    Birth control/protection: Post-menopausal  Other Topics Concern   Not on file   Social History Narrative   Not on file   Social Drivers of Health   Financial Resource Strain: Medium Risk (10/30/2021)   Overall Financial Resource Strain (CARDIA)    Difficulty of Paying Living Expenses: Somewhat hard  Food Insecurity: No Food Insecurity (03/29/2023)   Hunger Vital Sign    Worried About Running Out of Food in the Last Year: Never true    Ran Out of Food in the Last Year: Never true  Transportation Needs: No Transportation Needs (03/29/2023)   PRAPARE - Administrator, Civil Service (Medical): No    Lack of Transportation (Non-Medical): No  Physical Activity: Inactive (10/30/2021)   Exercise Vital Sign    Days of Exercise per Week: 0 days    Minutes of Exercise per Session: 0 min  Stress: Stress Concern Present (10/30/2021)   [employer] of Occupational Health - Occupational Stress Questionnaire    Feeling of Stress : Rather much  Social Connections: Socially Integrated (10/30/2021)   Social Connection and Isolation Panel    Frequency of Communication with Friends and Family: Three times a week    Frequency of Social Gatherings with Friends and Family: Twice a week    Attends Religious Services: 1 to 4 times per year    Active Member of [employer] or Organizations: Yes    Attends Banker Meetings: 1 to 4 times per year    Marital Status: Married  Catering Manager Violence: Not At Risk (03/29/2023)   Humiliation, Afraid, Rape, and Kick questionnaire    Fear of Current or Ex-Partner: No    Emotionally Abused: No    Physically Abused: No    Sexually Abused: No    Review of Systems: See HPI, otherwise negative ROS  Physical Exam: BP (!) 156/95   Pulse 100   Temp (!) 96.8 F (36 C) (Temporal)   Resp 16   Ht 5' 5 (1.651 m)   Wt 97.5 kg   LMP 04/22/2019   SpO2 99%   BMI 35.78 kg/m  General:   Alert,  pleasant and cooperative in NAD Head:  Normocephalic and atraumatic. Neck:  Supple; no masses or thyromegaly. Lungs:  Clear  throughout to auscultation.    Heart:  Regular rate and rhythm. Abdomen:  Soft, nontender and nondistended. Normal bowel sounds, without guarding, and without rebound.   Neurologic:  Alert and  oriented x4;  grossly normal neurologically.  Impression/Plan: Jill Walsh is now here to undergo a screening colonoscopy.  Risks, benefits, and alternatives regarding colonoscopy have been reviewed with the patient.  Questions have been answered.  All parties agreeable.

## 2024-01-17 NOTE — Anesthesia Preprocedure Evaluation (Signed)
 Anesthesia Evaluation  Patient identified by MRN, date of birth, ID band Patient awake    Reviewed: Allergy & Precautions, NPO status , Patient's Chart, lab work & pertinent test results  Airway Mallampati: II  TM Distance: >3 FB Neck ROM: Full    Dental  (+) Upper Dentures, Edentulous Lower   Pulmonary neg pulmonary ROS, asthma , Patient abstained from smoking., former smoker   Pulmonary exam normal        Cardiovascular Exercise Tolerance: Good negative cardio ROS Normal cardiovascular exam Rhythm:Regular Rate:Normal     Neuro/Psych negative neurological ROS  negative psych ROS   GI/Hepatic negative GI ROS, Neg liver ROS,GERD  ,,  Endo/Other  negative endocrine ROS  Class 3 obesity  Renal/GU negative Renal ROS  negative genitourinary   Musculoskeletal   Abdominal  (+) + obese  Peds negative pediatric ROS (+)  Hematology negative hematology ROS (+)   Anesthesia Other Findings Past Medical History: No date: Asthma 2016: Breast cancer (HCC)     Comment:  RT MASTECTOMY 2022: Breast cancer (HCC)     Comment:  Left breast No date: GERD (gastroesophageal reflux disease)  Past Surgical History: No date: BREAST BIOPSY; Left     Comment:  02/28/14 negative No date: BREAST BIOPSY; Right     Comment:   02/2014 malignant 08/2023: BREAST ENHANCEMENT SURGERY     Comment:  both mastectomy 06/29/2022: BREAST RECONSTRUCTION WITH PLACEMENT OF TISSUE EXPANDER  AND FLEX HD (ACELLULAR HYDRATED DERMIS); Bilateral     Comment:  Procedure: BREAST RECONSTRUCTION WITH PLACEMENT OF               TISSUE EXPANDER AND FLEX HD (ACELLULAR HYDRATED DERMIS);               Surgeon: Waddell Leonce NOVAK, MD;  Location: [facility];  Service: Plastics;  Laterality:               Bilateral; 11/13/2018: COLONOSCOPY WITH PROPOFOL ; N/A     Comment:  Procedure: COLONOSCOPY WITH PROPOFOL ;  Surgeon: Therisa Bi, MD;  Location: [facility];  Service:               Gastroenterology;  Laterality: N/A; 2016: MASTECTOMY; Right     Comment:  BREAST CA 02/07/2023: REMOVAL OF TISSUE EXPANDER AND PLACEMENT OF IMPLANT;  Bilateral     Comment:  Procedure: bilateral removal of tissue expanders and               placement of implants;  Surgeon: Waddell Leonce NOVAK, MD;                Location: [facility];  Service: Plastics;               Laterality: Bilateral; 08/09/2023: REVISION, RECONSTRUCTION, BREAST; Right     Comment:  Procedure: REVISION, RECONSTRUCTION, BREAST;  Surgeon:               Waddell Leonce NOVAK, MD;  Location:  SURGERY               [facility];  Service: Plastics;  Laterality: Right;  Right               breast reconstruction revision 05/29/2020: TOTAL MASTECTOMY; Left     Comment:  Procedure: TOTAL MASTECTOMY;  Surgeon: Tye,  Isami, DO;              Location: [facility];  Service: General;  Laterality: Left; No date: TUBAL LIGATION  BMI    Body Mass Index: 35.78 kg/m      Reproductive/Obstetrics negative OB ROS                              Anesthesia Physical Anesthesia Plan  ASA: 2  Anesthesia Plan: General   Post-op Pain Management:    Induction: Intravenous  PONV Risk Score and Plan: Propofol  infusion and TIVA  Airway Management Planned: Natural Airway and Nasal Cannula  Additional Equipment:   Intra-op Plan:   Post-operative Plan:   Informed Consent: I have reviewed the patients History and Physical, chart, labs and discussed the procedure including the risks, benefits and alternatives for the proposed anesthesia with the patient or authorized representative who has indicated his/her understanding and acceptance.     Dental Advisory Given  Plan Discussed with: CRNA  Anesthesia Plan Comments:         Anesthesia Quick Evaluation

## 2024-01-17 NOTE — Anesthesia Postprocedure Evaluation (Signed)
 Anesthesia Post Note  Patient: Jill Walsh  Procedure(s) Performed: COLONOSCOPY  Patient location during evaluation: PACU Anesthesia Type: General Level of consciousness: awake Pain management: pain level controlled Vital Signs Assessment: post-procedure vital signs reviewed and stable Respiratory status: spontaneous breathing Cardiovascular status: stable Anesthetic complications: no   There were no known notable events for this encounter.   Last Vitals:  Vitals:   01/17/24 1012 01/17/24 1023  BP: 128/89 (!) 137/90  Pulse: 75 68  Resp: 17 17  Temp:    SpO2: 100% 100%    Last Pain:  Vitals:   01/17/24 1023  TempSrc:   PainSc: 0-No pain                 VAN STAVEREN,Nakeita Styles

## 2024-01-17 NOTE — Op Note (Signed)
 [facility]astroenter[facility] Patient Name: Jill Walsh Procedure Date: 01/17/2024 9:33 AM MRN: [id number] Account #: [id number] Date of Birth: [birth year] Admit Type: Outpatient Age: 53 Room: [facility] ENDO ROOM 4 Gender: Female Note Status: Finalized Instrument Name: Colon Scope [id number] Procedure:             Colonoscopy Indications:           Screening in patient at increased risk: Family history                         of 1st-degree relative with colorectal cancer before                         age 74 years Providers:             Rogelia Copping MD, MD Referring MD:          Rogelia Copping MD, MD (Referring MD) Medicines:             Propofol  per Anesthesia Complications:         No immediate complications. Procedure:             Pre-Anesthesia Assessment:                        - Prior to the procedure, a History and Physical was                         performed, and patient medications and allergies were                         reviewed. The patient's tolerance of previous                         anesthesia was also reviewed. The risks and benefits                         of the procedure and the sedation options and risks                         were discussed with the patient. All questions were                         answered, and informed consent was obtained. Prior                         Anticoagulants: The patient has taken no anticoagulant                         or antiplatelet agents. ASA Grade Assessment: II - A                         patient with mild systemic disease. After reviewing                         the risks and benefits, the patient was deemed in                         satisfactory condition to undergo the procedure.  After obtaining informed consent, the colonoscope was                         passed under direct vision. Throughout the procedure,                         the patient's blood pressure, pulse, and oxygen                           saturations were monitored continuously. The                         Colonoscope was introduced through the anus and                         advanced to the the cecum, identified by appendiceal                         orifice and ileocecal valve. The colonoscopy was                         performed without difficulty. The patient tolerated                         the procedure well. The quality of the bowel                         preparation was good. Findings:      The perianal and digital rectal examinations were normal.      The colon (entire examined portion) appeared normal. Impression:            - The entire examined colon is normal.                        - No specimens collected. Recommendation:        - Discharge patient to home.                        - Resume previous diet.                        - Continue present medications.                        - Repeat colonoscopy in 5 years for surveillance. Procedure Code(s):     --- Professional ---                        [id number], Colonoscopy, flexible; diagnostic, including                         collection of specimen(s) by brushing or washing, when                         performed (separate procedure) Diagnosis Code(s):     --- Professional ---                        Z80.0, Family history of malignant neoplasm of  digestive organs CPT copyright 2022 [employer]. All rights reserved. The codes documented in this report are preliminary and upon coder review may  be revised to meet current compliance requirements. Rogelia Copping MD, MD 01/17/2024 9:59:40 AM This report has been signed electronically. Number of Addenda: 0 Note Initiated On: 01/17/2024 9:33 AM Scope Withdrawal Time: 0 hours 6 minutes 9 seconds  Total Procedure Duration: 0 hours 11 minutes 44 seconds  Estimated Blood Loss:  Estimated blood loss: none.      [facility]

## 2024-01-17 NOTE — Transfer of Care (Signed)
 Immediate Anesthesia Transfer of Care Note  Patient: Jill Walsh  Procedure(s) Performed: COLONOSCOPY  Patient Location: PACU  Anesthesia Type:General  Level of Consciousness: awake and patient cooperative  Airway & Oxygen  Therapy: Patient Spontanous Breathing  Post-op Assessment: Report given to RN and Post -op Vital signs reviewed and stable  Post vital signs: stable  Last Vitals:  Vitals Value Taken Time  BP    Temp    Pulse 82 01/17/24 10:02  Resp 19 01/17/24 10:02  SpO2 100 % 01/17/24 10:02  Vitals shown include unfiled device data.  Last Pain:  Vitals:   01/17/24 0841  TempSrc: Temporal  PainSc: 0-No pain         Complications: There were no known notable events for this encounter.

## 2024-01-19 ENCOUNTER — Encounter: Admitting: Physician Assistant

## 2024-01-25 ENCOUNTER — Encounter: Admitting: Physician Assistant

## 2024-02-07 ENCOUNTER — Encounter: Payer: Self-pay | Admitting: Plastic Surgery

## 2024-02-07 ENCOUNTER — Ambulatory Visit: Admitting: Plastic Surgery

## 2024-02-07 VITALS — BP 144/88 | HR 98

## 2024-02-07 DIAGNOSIS — Z9013 Acquired absence of bilateral breasts and nipples: Secondary | ICD-10-CM | POA: Diagnosis not present

## 2024-02-07 DIAGNOSIS — Z08 Encounter for follow-up examination after completed treatment for malignant neoplasm: Secondary | ICD-10-CM | POA: Diagnosis not present

## 2024-02-07 DIAGNOSIS — Z853 Personal history of malignant neoplasm of breast: Secondary | ICD-10-CM

## 2024-02-07 DIAGNOSIS — C50011 Malignant neoplasm of nipple and areola, right female breast: Secondary | ICD-10-CM

## 2024-02-07 NOTE — Progress Notes (Signed)
" ° °  Subjective:    Patient ID: Jill Walsh, female    DOB: 1970/03/03, 53 y.o.   MRN: [id number]  The patient is a 53 year old female here for evaluation of her breast.  She had a right mastectomy in 2016 and then the left 1 in 2022.  She had quite a bit of asymmetry so she was hoping for some reconstruction.  She did not have any radiation.  In May 2024 she underwent placement of expanders.  Then in December 2024 she had implants placed that are 375 cc high-profile gel implants.  She is happy with her reconstruction and is now interested in nipple areola tattooing.    Review of Systems  Constitutional: Negative.   HENT: Negative.    Eyes: Negative.   Respiratory: Negative.    Cardiovascular: Negative.   Gastrointestinal: Negative.   Endocrine: Negative.   Genitourinary: Negative.        Objective:   Physical Exam Vitals reviewed.  HENT:     Head: Atraumatic.  Cardiovascular:     Rate and Rhythm: Normal rate.     Pulses: Normal pulses.  Pulmonary:     Effort: Pulmonary effort is normal.  Abdominal:     Palpations: Abdomen is soft.  Skin:    General: Skin is warm.     Capillary Refill: Capillary refill takes less than 2 seconds.  Neurological:     Mental Status: She is alert and oriented to person, place, and time.  Psychiatric:        Mood and Affect: Mood normal.        Behavior: Behavior normal.        Thought Content: Thought content normal.        Judgment: Judgment normal.         Assessment & Plan:   Patient is a good candidate for nipple-areolar tattooing and we will get this set up for her.  She will need 2 treatments "

## 2024-04-13 ENCOUNTER — Ambulatory Visit: Admitting: Plastic Surgery

## 2024-04-24 ENCOUNTER — Other Ambulatory Visit: Admitting: Plastic Surgery
